# Patient Record
Sex: Male | Born: 1935 | Race: White | Hispanic: No | Marital: Married | Smoking: Never smoker
Health system: Southern US, Community
[De-identification: ages and names within clinical notes are randomized; demographics above are authoritative.]

## PROBLEM LIST (undated history)

## (undated) DIAGNOSIS — G309 Alzheimer's disease, unspecified: Secondary | ICD-10-CM

## (undated) DIAGNOSIS — I639 Cerebral infarction, unspecified: Secondary | ICD-10-CM

## (undated) DIAGNOSIS — C4490 Unspecified malignant neoplasm of skin, unspecified: Secondary | ICD-10-CM

## (undated) DIAGNOSIS — K5909 Other constipation: Secondary | ICD-10-CM

## (undated) DIAGNOSIS — F0280 Dementia in other diseases classified elsewhere without behavioral disturbance: Secondary | ICD-10-CM

## (undated) HISTORY — PX: HAND TENDON SURGERY: SHX663

## (undated) HISTORY — PX: OTHER SURGICAL HISTORY: SHX169

## (undated) HISTORY — PX: CATARACT EXTRACTION, BILATERAL: SHX1313

---

## 2000-08-15 ENCOUNTER — Encounter: Payer: Self-pay | Admitting: Specialist

## 2000-08-15 ENCOUNTER — Ambulatory Visit: Admission: RE | Admit: 2000-08-15 | Discharge: 2000-08-15 | Payer: Self-pay | Admitting: Specialist

## 2007-03-10 ENCOUNTER — Encounter: Admission: RE | Admit: 2007-03-10 | Discharge: 2007-03-10 | Payer: Self-pay | Admitting: Orthopedic Surgery

## 2007-03-12 ENCOUNTER — Ambulatory Visit (HOSPITAL_BASED_OUTPATIENT_CLINIC_OR_DEPARTMENT_OTHER): Admission: RE | Admit: 2007-03-12 | Discharge: 2007-03-12 | Payer: Self-pay | Admitting: Orthopedic Surgery

## 2007-03-12 ENCOUNTER — Encounter (INDEPENDENT_AMBULATORY_CARE_PROVIDER_SITE_OTHER): Payer: Self-pay | Admitting: Orthopedic Surgery

## 2009-01-05 ENCOUNTER — Encounter: Admission: RE | Admit: 2009-01-05 | Discharge: 2009-01-05 | Payer: Self-pay | Admitting: Emergency Medicine

## 2011-01-02 NOTE — Op Note (Signed)
NAME:  Matthew Hodge, Matthew Hodge NO.:  [id number]   MEDICAL RECORD NO.:  [id number]          PATIENT TYPE:  AMB   LOCATION:  DSC                          FACILITY:  [facility]   PHYSICIAN:  Cindee Salt, M.D.       DATE OF BIRTH:  06-09-1925   DATE OF PROCEDURE:  03/12/2007  DATE OF DISCHARGE:                               OPERATIVE REPORT   PREOPERATIVE DIAGNOSIS:  Mucoid cyst interphalangeal joint left thumb.   POSTOPERATIVE DIAGNOSIS:  Mucoid cyst interphalangeal joint left thumb.   OPERATION:  Excision mucoid cyst, debridement interphalangeal joint left  thumb with removal of loose body.   SURGEON:  Merlyn Lot, M.D.   ASSISTANT:  Carolyne Fiscal R.N.   ANESTHESIA:  Forearm-based IV regional.   HISTORY:  The patient is an 77 year old male with a history of a large  mass at the interphalangeal joint of his left thumb with obvious  degenerative changes.  X-rays revealed a large loose body.  He is  desirous of removal of the mass, debridement of the joint without a plan  for fusion.  He is aware of risks and complications including infection,  recurrence, injury to arteries, nerves, tendons, incomplete relief of  symptoms, and dystrophy.  He has elected to proceed to have this done.  Questions were encouraged and answered in the preoperative area.  The  patient is seen.  The extremity marked by both the patient and surgeon,  questions encouraged and answered again.   PROCEDURE:  The patient is brought to the operating room where a forearm-  based IV regional anesthetic was carried out without difficulty.  He was  prepped using DuraPrep, supine position, left arm free.  After 3-minute  dry time, he was draped.  Curvilinear incision was made over the mass,  interphalangeal joint radial aspect of the IP joint, carried down  through subcutaneous tissue.  Bleeders were electrocauterized.  The mass  was immediately encountered.  With blunt and sharp dissection, this was  dissected free.  The  joint opened.  The loose body was then excised.  A  debridement of the interphalangeal joint of osteophytes was then  performed with a small rongeur.  The specimens were sent to pathology.  The wound was then copiously irrigated with saline.  The capsule was  then closed with interrupted and figure-of-eight 4-0 Vicryl sutures to  attempt to stabilize the joint and prevent recurrence of the cyst.  The  skin was then closed with interrupted 5-0 Vicryl Rapide sutures.  Sterile compressive dressing and splint to the thumb applied.  The  patient tolerated the procedure well and was taken to the recovery room  for observation in satisfactory condition.  He is discharged home to  return to the [facility] [place] in 1 week on Vicodin.           ______________________________  Cindee Salt, M.D.     GK/MEDQ  D:  03/12/2007  T:  03/12/2007  Job:  [id number]

## 2011-06-04 LAB — POCT HEMOGLOBIN-HEMACUE
Hemoglobin: 11.9 — ABNORMAL LOW
Operator id: 154361

## 2012-07-02 ENCOUNTER — Other Ambulatory Visit: Payer: Self-pay | Admitting: Family Medicine

## 2012-07-02 DIAGNOSIS — N400 Enlarged prostate without lower urinary tract symptoms: Secondary | ICD-10-CM

## 2012-07-04 ENCOUNTER — Ambulatory Visit
Admission: RE | Admit: 2012-07-04 | Discharge: 2012-07-04 | Disposition: A | Discharge status: Home / Self Care | Payer: Medicare Other | Source: Ambulatory Visit | Attending: Family Medicine | Admitting: Family Medicine

## 2012-07-04 DIAGNOSIS — N400 Enlarged prostate without lower urinary tract symptoms: Secondary | ICD-10-CM

## 2012-07-04 MED ORDER — IOHEXOL 300 MG/ML  SOLN
100.0000 mL | Freq: Once | INTRAMUSCULAR | Status: AC | PRN
  Administered 2012-07-04: 100 mL via INTRAVENOUS

## 2012-07-15 ENCOUNTER — Observation Stay (HOSPITAL_COMMUNITY)
Admission: EM | Admit: 2012-07-15 | Discharge: 2012-07-16 | Disposition: A | Discharge status: Home / Self Care | Payer: Medicare Other | Attending: Internal Medicine | Admitting: Internal Medicine

## 2012-07-15 ENCOUNTER — Observation Stay (HOSPITAL_COMMUNITY): Payer: Medicare Other

## 2012-07-15 ENCOUNTER — Encounter (HOSPITAL_COMMUNITY): Payer: Self-pay | Admitting: *Deleted

## 2012-07-15 DIAGNOSIS — Q619 Cystic kidney disease, unspecified: Secondary | ICD-10-CM | POA: Insufficient documentation

## 2012-07-15 DIAGNOSIS — N179 Acute kidney failure, unspecified: Principal | ICD-10-CM | POA: Insufficient documentation

## 2012-07-15 DIAGNOSIS — Z79899 Other long term (current) drug therapy: Secondary | ICD-10-CM | POA: Insufficient documentation

## 2012-07-15 DIAGNOSIS — M25559 Pain in unspecified hip: Secondary | ICD-10-CM | POA: Insufficient documentation

## 2012-07-15 DIAGNOSIS — N19 Unspecified kidney failure: Secondary | ICD-10-CM

## 2012-07-15 DIAGNOSIS — D72829 Elevated white blood cell count, unspecified: Secondary | ICD-10-CM | POA: Diagnosis present

## 2012-07-15 DIAGNOSIS — R739 Hyperglycemia, unspecified: Secondary | ICD-10-CM | POA: Diagnosis present

## 2012-07-15 DIAGNOSIS — D649 Anemia, unspecified: Secondary | ICD-10-CM | POA: Insufficient documentation

## 2012-07-15 DIAGNOSIS — R338 Other retention of urine: Secondary | ICD-10-CM | POA: Insufficient documentation

## 2012-07-15 DIAGNOSIS — N138 Other obstructive and reflux uropathy: Secondary | ICD-10-CM | POA: Insufficient documentation

## 2012-07-15 DIAGNOSIS — N401 Enlarged prostate with lower urinary tract symptoms: Secondary | ICD-10-CM | POA: Diagnosis present

## 2012-07-15 DIAGNOSIS — R7309 Other abnormal glucose: Secondary | ICD-10-CM | POA: Insufficient documentation

## 2012-07-15 DIAGNOSIS — F0280 Dementia in other diseases classified elsewhere without behavioral disturbance: Secondary | ICD-10-CM | POA: Insufficient documentation

## 2012-07-15 DIAGNOSIS — R609 Edema, unspecified: Secondary | ICD-10-CM | POA: Insufficient documentation

## 2012-07-15 DIAGNOSIS — N390 Urinary tract infection, site not specified: Secondary | ICD-10-CM | POA: Diagnosis present

## 2012-07-15 DIAGNOSIS — K5909 Other constipation: Secondary | ICD-10-CM | POA: Insufficient documentation

## 2012-07-15 DIAGNOSIS — R5381 Other malaise: Secondary | ICD-10-CM | POA: Insufficient documentation

## 2012-07-15 DIAGNOSIS — M549 Dorsalgia, unspecified: Secondary | ICD-10-CM | POA: Insufficient documentation

## 2012-07-15 DIAGNOSIS — G309 Alzheimer's disease, unspecified: Secondary | ICD-10-CM | POA: Insufficient documentation

## 2012-07-15 HISTORY — DX: Alzheimer's disease, unspecified: G30.9

## 2012-07-15 HISTORY — DX: Unspecified malignant neoplasm of skin, unspecified: C44.90

## 2012-07-15 HISTORY — DX: Other constipation: K59.09

## 2012-07-15 HISTORY — DX: Dementia in other diseases classified elsewhere, unspecified severity, without behavioral disturbance, psychotic disturbance, mood disturbance, and anxiety: F02.80

## 2012-07-15 LAB — BASIC METABOLIC PANEL
GFR calc Af Amer: 21 mL/min — ABNORMAL LOW (ref >90)
GFR calc non Af Amer: 18 mL/min — ABNORMAL LOW (ref >90)
Glucose, Bld: 152 mg/dL — ABNORMAL HIGH (ref 70–99)
Potassium: 4.9 mEq/L (ref 3.5–5.1)
Sodium: 133 mEq/L — ABNORMAL LOW (ref 135–145)

## 2012-07-15 LAB — URINALYSIS, ROUTINE W REFLEX MICROSCOPIC
Bilirubin Urine: NEGATIVE
Glucose, UA: NEGATIVE mg/dL
Ketones, ur: NEGATIVE mg/dL
pH: 5.5 (ref 5.0–8.0)

## 2012-07-15 LAB — CBC WITH DIFFERENTIAL/PLATELET
Basophils Absolute: 0 10*3/uL (ref 0.0–0.1)
Basophils Relative: 0 % (ref 0–1)
Eosinophils Absolute: 0 10*3/uL (ref 0.0–0.7)
Lymphs Abs: 0.6 10*3/uL — ABNORMAL LOW (ref 0.7–4.0)
MCH: 32.6 pg (ref 26.0–34.0)
Neutrophils Relative %: 89 % — ABNORMAL HIGH (ref 43–77)
Platelets: 251 10*3/uL (ref 150–400)
RBC: 3.86 MIL/uL — ABNORMAL LOW (ref 4.22–5.81)
RDW: 12.8 % (ref 11.5–15.5)

## 2012-07-15 LAB — URINE MICROSCOPIC-ADD ON

## 2012-07-15 MED ORDER — ALUM & MAG HYDROXIDE-SIMETH 200-200-20 MG/5ML PO SUSP: 30.0000 mL | Freq: Four times a day (QID) | ORAL | Status: DC | PRN

## 2012-07-15 MED ORDER — SODIUM CHLORIDE 0.9 % IV SOLN
INTRAVENOUS | Status: DC
  Administered 2012-07-15: 15:00:00 via INTRAVENOUS
  Administered 2012-07-16: 100 mL/h via INTRAVENOUS

## 2012-07-15 MED ORDER — ONDANSETRON HCL 4 MG PO TABS: 4.0000 mg | ORAL_TABLET | Freq: Four times a day (QID) | ORAL | Status: DC | PRN

## 2012-07-15 MED ORDER — POLYETHYLENE GLYCOL 3350 17 G PO PACK
17.0000 g | PACK | Freq: Every day | ORAL | Status: DC | PRN
  Filled 2012-07-15: qty 1

## 2012-07-15 MED ORDER — LUBIPROSTONE 24 MCG PO CAPS
24.0000 ug | ORAL_CAPSULE | Freq: Two times a day (BID) | ORAL | Status: DC
  Administered 2012-07-15 – 2012-07-16 (×2): 24 ug via ORAL
  Filled 2012-07-15 (×5): qty 1

## 2012-07-15 MED ORDER — SODIUM CHLORIDE 0.9 % IV SOLN: INTRAVENOUS | Status: DC

## 2012-07-15 MED ORDER — CIPROFLOXACIN HCL 500 MG PO TABS
500.0000 mg | ORAL_TABLET | Freq: Two times a day (BID) | ORAL | Status: DC
  Administered 2012-07-15 – 2012-07-16 (×2): 500 mg via ORAL
  Filled 2012-07-15 (×3): qty 1

## 2012-07-15 MED ORDER — ONDANSETRON HCL 4 MG/2ML IJ SOLN: 4.0000 mg | Freq: Four times a day (QID) | INTRAMUSCULAR | Status: DC | PRN

## 2012-07-15 MED ORDER — ACETAMINOPHEN 500 MG PO TABS: 500.0000 mg | ORAL_TABLET | Freq: Four times a day (QID) | ORAL | Status: DC | PRN

## 2012-07-15 MED ORDER — SENNA 8.6 MG PO TABS: 2.0000 | ORAL_TABLET | Freq: Every evening | ORAL | Status: DC | PRN

## 2012-07-15 MED ORDER — POLYETHYLENE GLYCOL 3350 17 G PO PACK
17.0000 g | PACK | Freq: Every day | ORAL | Status: DC
  Administered 2012-07-15 – 2012-07-16 (×2): 17 g via ORAL
  Filled 2012-07-15 (×2): qty 1

## 2012-07-15 MED ORDER — TAMSULOSIN HCL 0.4 MG PO CAPS
0.4000 mg | ORAL_CAPSULE | Freq: Every day | ORAL | Status: DC
  Administered 2012-07-15: 0.4 mg via ORAL
  Filled 2012-07-15 (×2): qty 1

## 2012-07-15 NOTE — Consult Note (Signed)
Urology Consult   Physician requesting consult: Rama  Reason for consult: Urinary retention  History of Present Illness: Matthew Hodge is a 78 y.o. male with PMH significant for alzheimer's disease, skin cancer, and chronic constipation who presented to the ED today with c/o several days of severe lower abdominal and back discomfort.  He has had urinary frequency and urgency but no fevers.  A CT pelvis with contrast was done on 07/04/12 that revealed an enlarged prostate and several colonic diverticula. The pt was evaled by his PCP on 07/14/12 and was given Cipro.  He is on Flomax but he does not know who started the medication or when.  While in the ED a foley was placed with over 1L of urine return.  The pts sx improved immediately.  UA is clear with the exception of microscopic hematuria which is likely from foley insertion.  His Cr is elevated at 2.98.  The pt has alzheimer's and is not able to provide any history.  He states "you should talk to my wife because I can't remember anything."  HPI was obtained from the RN and previous documentation.   He is currently comfortable and without complaint.  Past Medical History  Diagnosis Date  . Alzheimer's disease   . Skin cancer   . Chronic constipation     Past Surgical History  Procedure Date  . Hand tendon surgery   . Cataract extraction, bilateral   . Skin cancer removal forehead     Current Hospital Medications:  Home Meds:  Prior to Admission medications   Medication Sig Start Date End Date Taking? Authorizing Provider  acetaminophen (TYLENOL) 500 MG tablet Take 500 mg by mouth every 6 (six) hours as needed. For pain.   Yes Historical Provider, MD  ciprofloxacin (CIPRO) 500 MG tablet Take 500 mg by mouth 2 (two) times daily. 14 day course of therapy; started 07/14/2012   Yes Historical Provider, MD  lubiprostone (AMITIZA) 24 MCG capsule Take 24 mcg by mouth 2 (two) times daily with a meal.   Yes Historical Provider, MD    Tamsulosin HCl (FLOMAX) 0.4 MG CAPS Take 0.4 mg by mouth at bedtime.   Yes Historical Provider, MD     Scheduled Meds:   . ciprofloxacin  500 mg Oral BID  . lubiprostone  24 mcg Oral BID WC  . Tamsulosin HCl  0.4 mg Oral QHS  . [DISCONTINUED] sodium chloride   Intravenous STAT   Continuous Infusions:   . sodium chloride     PRN Meds:.acetaminophen, alum & mag hydroxide-simeth, ondansetron (ZOFRAN) IV, ondansetron, polyethylene glycol  Allergies: No Known Allergies  Family History  Problem Relation Age of Onset  . Stroke Mother   . Stroke Father   . COPD Brother   . Heart disease Brother   . Diabetes Brother     Social History:  reports that he has never smoked. He has never used smokeless tobacco. He reports that he does not drink alcohol or use illicit drugs.  ROS: A complete review of systems could not be completed due to the pt's alzheimers disease  Physical Exam:  Vital signs in last 24 hours: Temp:  [97.4 F (36.3 C)-98.6 F (37 C)] 97.7 F (36.5 C) (11/26 1300) Pulse Rate:  [72-81] 72  (11/26 1300) Resp:  [18-20] 18  (11/26 1300) BP: (147-165)/(59-90) 154/59 mmHg (11/26 1300) SpO2:  [97 %-99 %] 97 % (11/26 1300) Weight:  [80.5 kg (177 lb 7.5 oz)-83.915 kg (185 lb)] 80.5 kg (  177 lb 7.5 oz) (11/26 1300) General:  Alert and oriented, No acute distress HEENT: Normocephalic, atraumatic Neck: No JVD or lymphadenopathy Cardiovascular: Regular rate and rhythm Lungs: Clear bilaterally Abdomen: Soft, nondistended, no abdominal masses; mildly tender over suprapubic area Back: No CVA tenderness Extremities: No edema Neurologic: Grossly intact GU: foley in place with approx 1000cc clear/yellow urine in bag  Laboratory Data:   [facility] 07/15/12 0910  WBC 12.9*  HGB 12.6*  HCT 35.6*  PLT 251     Basename 07/15/12 0910  NA 133*  K 4.9  CL 98  GLUCOSE 152*  BUN 40*  CALCIUM 8.7  CREATININE 2.98*     Results for orders placed during the hospital  encounter of 07/15/12 (from the past 24 hour(s))  CBC WITH DIFFERENTIAL     Status: Abnormal   Collection Time   07/15/12  9:10 AM      Component Value Range   WBC 12.9 (*) 4.0 - 10.5 K/uL   RBC 3.86 (*) 4.22 - 5.81 MIL/uL   Hemoglobin 12.6 (*) 13.0 - 17.0 g/dL   HCT 47.8 (*) 29.5 - 62.1 %   MCV 92.2  78.0 - 100.0 fL   MCH 32.6  26.0 - 34.0 pg   MCHC 35.4  30.0 - 36.0 g/dL   RDW 30.8  65.7 - 84.6 %   Platelets 251  150 - 400 K/uL   Neutrophils Relative 89 (*) 43 - 77 %   Neutro Abs 11.5 (*) 1.7 - 7.7 K/uL   Lymphocytes Relative 5 (*) 12 - 46 %   Lymphs Abs 0.6 (*) 0.7 - 4.0 K/uL   Monocytes Relative 6  3 - 12 %   Monocytes Absolute 0.8  0.1 - 1.0 K/uL   Eosinophils Relative 0  0 - 5 %   Eosinophils Absolute 0.0  0.0 - 0.7 K/uL   Basophils Relative 0  0 - 1 %   Basophils Absolute 0.0  0.0 - 0.1 K/uL  BASIC METABOLIC PANEL     Status: Abnormal   Collection Time   07/15/12  9:10 AM      Component Value Range   Sodium 133 (*) 135 - 145 mEq/L   Potassium 4.9  3.5 - 5.1 mEq/L   Chloride 98  96 - 112 mEq/L   CO2 23  19 - 32 mEq/L   Glucose, Bld 152 (*) 70 - 99 mg/dL   BUN 40 (*) 6 - 23 mg/dL   Creatinine, Ser 9.62 (*) 0.50 - 1.35 mg/dL   Calcium 8.7  8.4 - 95.2 mg/dL   GFR calc non Af Amer 18 (*) >90 mL/min   GFR calc Af Amer 21 (*) >90 mL/min  URINALYSIS, ROUTINE W REFLEX MICROSCOPIC     Status: Abnormal   Collection Time   07/15/12 10:56 AM      Component Value Range   Color, Urine YELLOW  YELLOW   APPearance CLEAR  CLEAR   Specific Gravity, Urine 1.011  1.005 - 1.030   pH 5.5  5.0 - 8.0   Glucose, UA NEGATIVE  NEGATIVE mg/dL   Hgb urine dipstick MODERATE (*) NEGATIVE   Bilirubin Urine NEGATIVE  NEGATIVE   Ketones, ur NEGATIVE  NEGATIVE mg/dL   Protein, ur NEGATIVE  NEGATIVE mg/dL   Urobilinogen, UA 0.2  0.0 - 1.0 mg/dL   Nitrite NEGATIVE  NEGATIVE   Leukocytes, UA NEGATIVE  NEGATIVE  URINE MICROSCOPIC-ADD ON     Status: Abnormal   Collection Time  07/15/12 10:56  AM      Component Value Range   Squamous Epithelial / LPF RARE  RARE   WBC, UA 0-2  <3 WBC/hpf   RBC / HPF 3-6  <3 RBC/hpf   Bacteria, UA FEW (*) RARE   Urine-Other MUCOUS PRESENT     No results found for this or any previous visit (from the past 240 hour(s)).  Renal Function:  Basename 07/15/12 0910  CREATININE 2.98*   Estimated Creatinine Clearance: 19.3 ml/min (by C-G formula based on Cr of 2.98).  Radiologic Imaging: Clinical Data: Enlarged prostate on physical exam  CT PELVIS WITH CONTRAST  Technique: Multidetector CT imaging of the pelvis was performed  using the standard protocol following the bolus administration of  intravenous contrast.  Contrast: OMNIPAQUE IOHEXOL 300 MG/ML SOLN  Comparison: None.  Findings: The prostate is enlarged measuring 4.3 x 5.5 x 5.5 cm  with inhomogeneous enhancement. The enlarged lobular prostate does  indent the posterior inferior aspect of the urinary bladder. The  urinary bladder is not thick walled and is unremarkable. Some  fluid is noted within the inguinal canals right greater than left.  There are multiple rectosigmoid colonic diverticula present.  Moderate atheromatous change is noted of the distal abdominal aorta  and common iliac arteries. Degenerative changes are noted  throughout the facet joints of the lower lumbar spine  IMPRESSION:  .  1. Enlarged prostate is noted above which does somewhat indent the  post inferior aspect of the urinary bladder.  2. Some fluid is noted in the inguinal canals right greater than  left.  3. Multiple rectosigmoid colonic diverticula.  Original Report Authenticated By: Dwyane Dee, M.D.  Impression/Assessment/Plan:  Urinary retention/ARF/CKD--this is likely associated with BPH. However, it is unclear how long he has been having retention issues.  He has chronic constipation which is also a contributing factor to retention and should be treated aggressively.  Foley should be left in  place for a least 1 week with continuation of Flomax.  A void trial can be performed in our office as an outpatient.  He will likely require cysto/PUS for size,  +/- urodynamics. If he fails the void trial and continues to have retention issues, decisions will have to be made regarding long term treatment i.e TURP vs I/O cathing vs SP tube or a combination of treatments.  I have evaluated the  pt and spoken  with his daughter to obtain additional hx and arrange f/u.   1. Daughter needs to arrange Power of Health Care Attorney 2. Keep foley catheter in place, and start of Flomax 3. Will give voiding trial in office as outpatient 4. In office prostate ultrasound, cysto, and flow rate, pvr.  5. In office discussion re: TURP, +/- S-P tube, or chronic foley cath in elderly man with advancing alzheimer's disease.  6. Medicine to aggressively treat constipation. Will be important part of potential bladder recovery.   Acute renal failure--due to obstruction and retention.  Follow Cr. Relieve obstipation  per IM.   [facility]rology for follow-up    YARBROUGH,AMANDA G. 07/15/2012, 2:26 PM

## 2012-07-15 NOTE — ED Notes (Signed)
Gave pt something to drink and eat 

## 2012-07-15 NOTE — ED Notes (Addendum)
Attempted to call report. Nurse will call back.

## 2012-07-15 NOTE — H&P (Signed)
[facility] History and Physical  EDAN JUDAY JWJ:[id number] DOB: 16-Dec-1924 DOA: 07/15/2012  Referring physician: Dr. Lorre Nick PCP: Leo Grosser, MD   Chief Complaint: Abdominal pain.   History of Present Illness: Matthew Hodge is an 78 y.o. male with a PMH of Alzheimer's disease who was brought to the hospital by his family secondary to groin, abdominal, back and hip pain for several days.  He has had progressive weakness since that time.  He has had urinary frequency and urgency, but no fever or dysuria.  He was seen by his PCP 07/14/12 and prescribed Cipro.  He has been restless.  Upon initial evaluation in the ER, the patient had a urinary catheter placed and it drained over 1 liter of urine.  His symptoms have dramatically improved since the catheter was placed.  Review of Systems: Constitutional: No fever, no chills;  Appetite normal; + weight loss over a long period of time, no weight gain.  HEENT: No blurry vision, no diplopia, no pharyngitis, no dysphagia, wears hearing aides CV: No chest pain, no palpitations.  Resp: No SOB, no cough. GI: No nausea, no vomiting, no diarrhea, no melena, no hematochezia.  GU: No dysuria, no hematuria, + urinary frequency/hesitancy/retention.  MSK: no myalgias, no arthralgias.  Neuro:  No headache, no focal neurological deficits, no history of seizures.  Psych: No depression, no anxiety.  Endo: No thyroid disease, no DM, no heat intolerance, no cold intolerance, no polyuria, no polydipsia  Skin: No rashes, no skin lesions.  Heme: No easy bruising, no history of blood diseases.  Past Medical History Past Medical History  Diagnosis Date  . Alzheimer's disease   . Skin cancer   . Chronic constipation      Past Surgical History Past Surgical History  Procedure Date  . Hand tendon surgery   . Cataract extraction, bilateral   . Skin cancer removal forehead      Social History: History   Social History  . Marital  Status: Married    Spouse Name: N/A    Number of Children: 2  . Years of Education: N/A   Occupational History  . Retired Automotive engineer    Social History Main Topics  . Smoking status: Never Smoker   . Smokeless tobacco: Never Used  . Alcohol Use: No  . Drug Use: No  . Sexually Active: No   Other Topics Concern  . Not on file   Social History Narrative   Married.  Lives in [place] with his wife.  Ambulates with a cane.      Family History:  Family History  Problem Relation Age of Onset  . Stroke Mother   . Stroke Father   . COPD Brother   . Heart disease Brother   . Diabetes Brother     Allergies: Review of patient's allergies indicates no known allergies.  Meds: Prior to Admission medications   Medication Sig Start Date End Date Taking? Authorizing Provider  acetaminophen (TYLENOL) 500 MG tablet Take 500 mg by mouth every 6 (six) hours as needed. For pain.   Yes Historical Provider, MD  ciprofloxacin (CIPRO) 500 MG tablet Take 500 mg by mouth 2 (two) times daily. 14 day course of therapy; started 07/14/2012   Yes Historical Provider, MD  lubiprostone (AMITIZA) 24 MCG capsule Take 24 mcg by mouth 2 (two) times daily with a meal.   Yes Historical Provider, MD  Tamsulosin HCl (FLOMAX) 0.4 MG CAPS Take 0.4 mg by mouth  at bedtime.   Yes Historical Provider, MD    Physical Exam: Filed Vitals:   07/15/12 0824 07/15/12 1204 07/15/12 1300  BP: 165/90 147/60 154/59  Pulse: 81 81 72  Temp: 97.4 F (36.3 C) 98.6 F (37 C) 97.7 F (36.5 C)  TempSrc: Oral Oral   Resp: 18 20 18   Height:   5\' 11"  (1.803 m)  Weight: 83.915 kg (185 lb)  80.5 kg (177 lb 7.5 oz)  SpO2: 98% 99% 97%     Physical Exam: Blood pressure 154/59, pulse 72, temperature 97.7 F (36.5 C), temperature source Oral, resp. rate 18, height 5\' 11"  (1.803 m), weight 80.5 kg (177 lb 7.5 oz), SpO2 97.00%. Gen: No acute distress Head: Normocephalic, atraumatic. He does have a wound on  his forehead from a prior skin cancer resection. Eyes: PERRLA, EOMI. Lens implants noted bilaterally. Mouth: Oropharynx clear with upper and lower dentures in place. Neck: Supple, no thyromegaly, no adenopathy, no jugular venous distention. Chest: Lungs clear to auscultation bilaterally with good air movement. CV: Regular rate, and rhythm. No murmurs, rubs, or gallops. Abdomen: Soft, nontender, nondistended with normal active bowel sounds. Extremities: Trace edema bilaterally. Skin: Warm and dry. Neuro: Alert and oriented x2. Cranial nerves II through XII grossly intact. Generalized weakness but no focal deficits. Psych: Mood and affect normal.  Labs on Admission:  Basic Metabolic Panel:  Lab 07/15/12 4098  NA 133*  K 4.9  CL 98  CO2 23  GLUCOSE 152*  BUN 40*  CREATININE 2.98*  CALCIUM 8.7  MG --  PHOS --   CBC:  Lab 07/15/12 0910  WBC 12.9*  NEUTROABS 11.5*  HGB 12.6*  HCT 35.6*  MCV 92.2  PLT 251   Urinalysis    Component Value Date/Time   COLORURINE YELLOW 07/15/2012 1056   APPEARANCEUR CLEAR 07/15/2012 1056   LABSPEC 1.011 07/15/2012 1056   PHURINE 5.5 07/15/2012 1056   GLUCOSEU NEGATIVE 07/15/2012 1056   HGBUR MODERATE* 07/15/2012 1056   BILIRUBINUR NEGATIVE 07/15/2012 1056   KETONESUR NEGATIVE 07/15/2012 1056   PROTEINUR NEGATIVE 07/15/2012 1056   UROBILINOGEN 0.2 07/15/2012 1056   NITRITE NEGATIVE 07/15/2012 1056   LEUKOCYTESUR NEGATIVE 07/15/2012 1056     Radiological Exams on Admission: No results found.   Assessment/Plan Principal Problem:  *Enlarged prostate with urinary retention  The patient was admitted and a Foley catheter placed.  Continue Flomax.  Urology consultation requested.  Will get a renal ultrasound.  Continue antibiotics for UTI (U/A relatively clear, but has taken several doses of Cipro) Active Problems:  Chronic constipation  Continue Amitiza.  MiraLAX PRN.  Hyperglycemia  Not a fasting sample.  BMET in a.m.   Acute renal failure  Likely post-obstructive.  Will get a renal ultrasound.  Hydrate and follow creatinine.  Leukocytosis  Likely from UTI, treating empirically.  Normocytic anemia  Mild, monitor.  UTI (lower urinary tract infection)  Continue empiric Cipro.  U/A negative for nitrates and leukocytes, but had been on antibiotics for 24 hours prior to the sample being sent.  Follow up urine culture.  Code Status: Full Family Communication: Gilead Oneal [phone]. Disposition Plan: Home when stable.  Time spent: 1 hour.  RAMA,CHRISTINA [facility] Pager [phone]  If 7PM-7AM, please contact night-coverage [email or web address] Password [email or web address] 07/15/2012, 2:10 PM

## 2012-07-15 NOTE — Plan of Care (Signed)
Problem: Phase I Progression Outcomes Goal: Voiding-avoid urinary catheter unless indicated Outcome: Not Progressing Foley catheter inserted for urinary retention

## 2012-07-15 NOTE — ED Provider Notes (Signed)
History     CSN: [id number]  Arrival date & time 07/15/12  4098   First MD Initiated Contact with Patient 07/15/12 [phone]      Chief Complaint  Patient presents with  . Urinary Retention  . Flank Pain    bilateral    (Consider location/radiation/quality/duration/timing/severity/associated sxs/prior treatment) Patient is a 78 y.o. male presenting with flank pain. The history is provided by the spouse and a relative.  Flank Pain   patient here with urinary retention x2 days. Saw his physician and was prescribed Cipro yesterday. Continues with urinary frequency of small amounts. No fever or vomiting. Some flank pain. No prior history of prostate enlargement. Urination makes her symptoms better.  Past Medical History  Diagnosis Date  . Alzheimer's disease     Past Surgical History  Procedure Date  . Hand tendon surgery     History reviewed. No pertinent family history.  History  Substance Use Topics  . Smoking status: Never Smoker   . Smokeless tobacco: Never Used  . Alcohol Use: No      Review of Systems  Genitourinary: Positive for flank pain.  All other systems reviewed and are negative.    Allergies  Review of patient's allergies indicates no known allergies.  Home Medications  No current outpatient prescriptions on file.  BP 165/90  Pulse 81  Temp 97.4 F (36.3 C) (Oral)  Resp 18  Wt 185 lb (83.915 kg)  SpO2 98%  Physical Exam  Nursing note and vitals reviewed. Constitutional: He is oriented to person, place, and time. He appears well-developed and well-nourished.  Non-toxic appearance. No distress.  HENT:  Head: Normocephalic and atraumatic.  Eyes: Conjunctivae normal, EOM and lids are normal. Pupils are equal, round, and reactive to light.  Neck: Normal range of motion. Neck supple. No tracheal deviation present. No mass present.  Cardiovascular: Normal rate, regular rhythm and normal heart sounds.  Exam reveals no gallop.   No murmur  heard. Pulmonary/Chest: Effort normal and breath sounds normal. No stridor. No respiratory distress. He has no decreased breath sounds. He has no wheezes. He has no rhonchi. He has no rales.  Abdominal: Soft. Normal appearance and bowel sounds are normal. He exhibits no distension. There is tenderness in the suprapubic area. There is no rigidity, no rebound, no guarding and no CVA tenderness.  Musculoskeletal: Normal range of motion. He exhibits no edema and no tenderness.  Neurological: He is alert and oriented to person, place, and time. He has normal strength. No cranial nerve deficit or sensory deficit. GCS eye subscore is 4. GCS verbal subscore is 5. GCS motor subscore is 6.  Skin: Skin is warm and dry. No abrasion and no rash noted.  Psychiatric: His speech is normal. His affect is blunt. He is slowed.    ED Course  Procedures (including critical care time)   Labs Reviewed  URINALYSIS, ROUTINE W REFLEX MICROSCOPIC  CBC WITH DIFFERENTIAL  BASIC METABOLIC PANEL  URINE CULTURE   No results found.   No diagnosis found.    MDM  The patient had a Foley catheter placed by nursing and drained over a liter of urine. He has evidence of renal failure likely from post obstructive causes. He'll be admitted to the hospital.        Toy Baker, MD 07/15/12 1113

## 2012-07-15 NOTE — ED Notes (Signed)
Pt from home with reports of urinary retention, "just dribbling" since yesterday as well as bilateral flank pain, bladder and abdominal pain. Pt denies fever, N/V/D. Pt's son also endorses that pt was treated by PCP yesterday with same symptoms and was given Cipro with no relief.

## 2012-07-15 NOTE — Plan of Care (Signed)
Problem: Problem: Bowel/Bladder Progression Goal: FOLEY CATHETER NOT INDICATED Outcome: Not Met (add Reason) Foley catheter inserted 07/15/12 for urinary retention.

## 2012-07-16 DIAGNOSIS — R339 Retention of urine, unspecified: Secondary | ICD-10-CM

## 2012-07-16 DIAGNOSIS — N179 Acute kidney failure, unspecified: Principal | ICD-10-CM

## 2012-07-16 DIAGNOSIS — N401 Enlarged prostate with lower urinary tract symptoms: Secondary | ICD-10-CM

## 2012-07-16 DIAGNOSIS — K5900 Constipation, unspecified: Secondary | ICD-10-CM

## 2012-07-16 LAB — URINE CULTURE

## 2012-07-16 LAB — BASIC METABOLIC PANEL
BUN: 26 mg/dL — ABNORMAL HIGH (ref 6–23)
CO2: 23 mEq/L (ref 19–32)
Calcium: 8.3 mg/dL — ABNORMAL LOW (ref 8.4–10.5)
Chloride: 104 mEq/L (ref 96–112)
Creatinine, Ser: 1.22 mg/dL (ref 0.50–1.35)
GFR calc Af Amer: 61 mL/min — ABNORMAL LOW (ref >90)
GFR calc non Af Amer: 52 mL/min — ABNORMAL LOW (ref >90)
Glucose, Bld: 105 mg/dL — ABNORMAL HIGH (ref 70–99)
Potassium: 3.9 mEq/L (ref 3.5–5.1)
Sodium: 136 mEq/L (ref 135–145)

## 2012-07-16 LAB — GLUCOSE, CAPILLARY: Glucose-Capillary: 125 mg/dL — ABNORMAL HIGH (ref 70–99)

## 2012-07-16 MED ORDER — CIPROFLOXACIN HCL 500 MG PO TABS: 500.0000 mg | ORAL_TABLET | Freq: Two times a day (BID) | ORAL | Status: DC

## 2012-07-16 MED ORDER — SENNA 8.6 MG PO TABS: 2.0000 | ORAL_TABLET | Freq: Every evening | ORAL | Status: DC | PRN

## 2012-07-16 MED ORDER — POLYETHYLENE GLYCOL 3350 17 G PO PACK: 17.0000 g | PACK | Freq: Every day | ORAL | Status: DC

## 2012-07-16 NOTE — Progress Notes (Signed)
CARE MANAGEMENT NOTE 07/16/2012  Patient:  Matthew Hodge, Matthew Hodge   Account Number:  [id number]  Date Initiated:  07/16/2012  Documentation initiated by:  Erian Lariviere  Subjective/Objective Assessment:   pt with urinary tract obstruction from bph, required uro inertion of foley cath, urine outpt improved.     Action/Plan:   from home has hx of alzheimer dz.   Anticipated DC Date:  07/16/2012   Anticipated DC Plan:  HOME W HOME HEALTH SERVICES  In-house referral  NA      DC Planning Services  CM consult      [facility] Choice  HOME HEALTH   Choice offered to / List presented to:  C-4 Adult Children   DME arranged  NA      DME agency  NA     HH arranged  HH-1 RN      [facility] agency  [facility]   Status of service:  Completed, signed off Medicare Important Message given?  NA - LOS <3 / Initial given by admissions (If response is "NO", the following Medicare IM given date fields will be blank) Date Medicare IM given:   Date Additional Medicare IM given:    Discharge Disposition:  HOME W HOME HEALTH SERVICES  Per UR Regulation:  Reviewed for med. necessity/level of care/duration of stay  If discussed at Long Length of Stay Meetings, dates discussed:    Comments:  [id number]/Vernis Cabacungan Earlene Plater, RN, BSN, CCM: CHART REVIEWED AND UPDATED.  Next chart review due on [id number]. NO DISCHARGE NEEDS PRESENT AT THIS TIME. CASE MANAGEMENT [phone]

## 2012-07-16 NOTE — Progress Notes (Signed)
Patient discharged home, all discharge medications and instructions reviewed and questions answered. [facility] with [facility]. Demonstrated and had family do a return demonstration of switching from foley bag to leg bag and how to empty each bag. Patient to be assisted to vehicle by wheelchair.

## 2012-07-16 NOTE — Discharge Summary (Signed)
Physician Discharge Summary  Matthew Hodge:[id number] DOB: 23-Jun-1925 DOA: 07/15/2012  PCP: Leo Grosser, MD  Admit date: 07/15/2012 Discharge date: 07/16/2012  Time spent: 35  minutes  Recommendations for Outpatient Follow-up:  1. Follow up with Urology, Dr Patsi Sears within 1 week. Follow up with PCP for Bmet.   Discharge Diagnoses:  Acute renal failure, post obstructive secondary to urine retention. Enlarged prostate with urinary retention  Chronic constipation  Hyperglycemia  Acute renal failure  Leukocytosis  Normocytic anemia  UTI (lower urinary tract infection)   Discharge Condition: Stable.  Diet recommendation: Health Healthy.  Filed Weights   07/15/12 0824 07/15/12 1300  Weight: 83.915 kg (185 lb) 80.5 kg (177 lb 7.5 oz)    History of present illness:  Matthew Hodge is an 78 y.o. male with a PMH of Alzheimer's disease who was brought to the hospital by his family secondary to groin, abdominal, back and hip pain for several days. He has had progressive weakness since that time. He has had urinary frequency and urgency, but no fever or dysuria. He was seen by his PCP 07/14/12 and prescribed Cipro. He has been restless. Upon initial evaluation in the ER, the patient had a urinary catheter placed and it drained over 1 liter of urine. His symptoms have dramatically improved since the catheter was placed.   Hospital Course:   Enlarged prostate with urinary retention  The patient was admitted and a Foley catheter placed. Continue Flomax.  Urology consultation requested, Dr Meredeth Ide recommend to continue with foley catheter for 1 week, Flomax, voiding trial in 1 week. Patient might need In office prostate ultrasound, cysto, and flow rate, pvr.  renal ultrasound  Mild fullness of the left renal collecting system without overt  caliectasis.  Minimally complex left renal cyst. Continue antibiotics for UTI (U/A relatively clear, but has taken several doses  of Cipro). Complete total of 7 days.   Chronic constipation  Continue Amitiza. MiraLAX added to regimen. Hyperglycemia  Not a fasting sample. BMET in a.m. Acute renal failure  Likely post-obstructive. renal ultrasound negative for hydronephrosis. Cr. peak to 2.9, has decrease to 1.2. Needs repeat B-met and follow up with PCP. Leukocytosis  Likely from UTI, treating empirically. Normocytic anemia  Mild, monitor. UTI (lower urinary tract infection)  Continue empiric Cipro. U/A negative for nitrates and leukocytes, but had been on antibiotics for 24 hours prior to the sample being sent. Needs to Follow up urine culture by PCP.      Consultations: Urology, Dr. Patsi Sears.  Discharge Exam: Filed Vitals:   07/15/12 1204 07/15/12 1300 07/15/12 2212 07/16/12 0628  BP: 147/60 154/59 134/66 140/60  Pulse: 81 72 79 80  Temp: 98.6 F (37 C) 97.7 F (36.5 C) 99.1 F (37.3 C) 98.2 F (36.8 C)  TempSrc: Oral  Oral Oral  Resp: 20 18 18 16   Height:  5\' 11"  (1.803 m)    Weight:  80.5 kg (177 lb 7.5 oz)    SpO2: 99% 97% 95% 98%    General: No Distress. Cardiovascular: S 1,S2 RRR Respiratory: CTA Abdomen: Soft, NT, ND No rigidity.  Discharge Instructions  Discharge Orders    Future Orders Please Complete By Expires   Diet - low sodium heart healthy      Increase activity slowly          Medication List     As of 07/16/2012 11:02 AM    TAKE these medications         acetaminophen 500  MG tablet   Commonly known as: TYLENOL   Take 500 mg by mouth every 6 (six) hours as needed. For pain.      ciprofloxacin 500 MG tablet   Commonly known as: CIPRO   Take 1 tablet (500 mg total) by mouth 2 (two) times daily.      lubiprostone 24 MCG capsule   Commonly known as: AMITIZA   Take 24 mcg by mouth 2 (two) times daily with a meal.      polyethylene glycol packet   Commonly known as: MIRALAX / GLYCOLAX   Take 17 g by mouth daily.      senna 8.6 MG Tabs   Commonly known as:  SENOKOT   Take 2 tablets (17.2 mg total) by mouth at bedtime as needed.      Tamsulosin HCl 0.4 MG Caps   Commonly known as: FLOMAX   Take 0.4 mg by mouth at bedtime.          The results of significant diagnostics from this hospitalization (including imaging, microbiology, ancillary and laboratory) are listed below for reference.    Significant Diagnostic Studies: Ct Pelvis W Contrast  07/04/2012  *RADIOLOGY REPORT*  Clinical Data:  Enlarged prostate on physical exam  CT PELVIS WITH CONTRAST  Technique:  Multidetector CT imaging of the pelvis was performed using the standard protocol following the bolus administration of intravenous contrast.  Contrast: OMNIPAQUE IOHEXOL 300 MG/ML  SOLN  Comparison:   None.  Findings:  The prostate is enlarged measuring 4.3 x 5.5 x 5.5 cm with inhomogeneous enhancement.  The enlarged lobular prostate does indent the posterior inferior aspect of the urinary bladder.  The urinary bladder is not thick walled and is unremarkable.  Some fluid is noted within the inguinal canals right greater than left. There are multiple rectosigmoid colonic diverticula present. Moderate atheromatous change is noted of the distal abdominal aorta and common iliac arteries.  Degenerative changes are noted throughout the facet joints of the lower lumbar spine  IMPRESSION: . 1.  Enlarged prostate is noted above which does somewhat indent the post inferior aspect of the urinary bladder. 2.  Some fluid is noted in the inguinal canals right greater than left. 3.  Multiple rectosigmoid colonic diverticula.   Original Report Authenticated By: Dwyane Dee, M.D.    US Renal  07/15/2012  *RADIOLOGY REPORT*  Clinical Data:  Acute renal failure, evaluate prostatomegaly and hydronephrosis  RENAL/URINARY TRACT ULTRASOUND COMPLETE  Comparison:  CT pelvis 07/04/2012  Findings:  Right Kidney:  The right kidney is normal in size, contour and parenchymal echogenicity measuring 9.8 cm in length.  No  focal lesion.  No hydronephrosis.  Left Kidney:  Normal in size (10.4 cm in length) and parenchymal echogenicity.  No evidence of solid mass. Limited evaluation. There is mild fullness of the renal collecting system without overt hydronephrosis.  A 2.3 x 2.3 x 1.8 cm hypoechoic cystic lesion with a very thin internal septation is most consistent with a minimally complex renal cyst.  Bladder:  Foley catheter in the collapsed bladder.  IMPRESSION:  1. Mild fullness of the left renal collecting system without overt caliectasis.  2.  Minimally complex left renal cyst.   Original Report Authenticated By: Malachy Moan, M.D.     Microbiology: No results found for this or any previous visit (from the past 240 hour(s)).   Labs: Basic Metabolic Panel:  Lab 07/16/12 1308 07/15/12 0910  NA 136 133*  K 3.9 4.9  CL 104  98  CO2 23 23  GLUCOSE 105* 152*  BUN 26* 40*  CREATININE 1.22 2.98*  CALCIUM 8.3* 8.7  MG -- --  PHOS -- --   CBC:  Lab 07/15/12 0910  WBC 12.9*  NEUTROABS 11.5*  HGB 12.6*  HCT 35.6*  MCV 92.2  PLT 251   Cardiac Enzymes: No results found for this basename: CKTOTAL:5,CKMB:5,CKMBINDEX:5,TROPONINI:5 in the last 168 hours BNP: BNP (last 3 results) No results found for this basename: PROBNP:3 in the last 8760 hours CBG:  Lab 07/15/12 2203  GLUCAP 125*       Signed:  Tiersa Dayley  [facility] 07/16/2012, 11:02 AM

## 2012-07-16 NOTE — Evaluation (Signed)
Physical Therapy Evaluation Patient Details Name: ABDULRAHEEM PINEO MRN: [id number] DOB: July 22, 1925 Today's Date: 07/16/2012 Time: 7829-5621 PT Time Calculation (min): 42 min  PT Assessment / Plan / Recommendation Clinical Impression  78 yo male admitted with urinary retention, abdominal and back pain.  He had been functionally independent at home as recently as a few days ago.  Pt presents with deconditioning, back and leg pain that increase with activity.  Feel he would benefir from HHPT for  strengthening, balance and fall prevention programs. Also recommend HHaide to decrease burden of care for elderly wife  as pt recovers from this episode    PT Assessment  Patient needs continued PT services    Follow Up Recommendations  Home health PT [facility])    Does the patient have the potential to tolerate intense rehabilitation      Barriers to Discharge        Equipment Recommendations       Recommendations for Other Services     Frequency Min 3X/week    Precautions / Restrictions Precautions Precautions: Fall Precaution Comments: pt reports he has had falls backwards recently Restrictions Weight Bearing Restrictions: No   Pertinent Vitals/Pain C/o back pain and leg pain with increase in activity      Mobility  Bed Mobility Bed Mobility: Supine to Sit;Sit to Supine Supine to Sit: 4: Min assist Details for Bed Mobility Assistance: extra time Transfers Transfers: Sit to Stand;Stand to Sit Sit to Stand: 5: Supervision Stand to Sit: 5: Supervision Details for Transfer Assistance: encouragement to stand erect and acitivate core Ambulation/Gait Ambulation/Gait Assistance: 4: Min assist Ambulation Distance (Feet): 75 Feet Assistive device: Rolling walker Ambulation/Gait Assistance Details: pt needs more assist as he fatigues and pain in back and legs increases Gait Pattern: Step-through pattern;Antalgic Gait velocity: decreased General Gait Details: needs RW for support  due to limited endurance and pain Stairs: No    Shoulder Instructions     Exercises Other Exercises Other Exercises: pt instructed in beginning core and LE exercise, given written sheet and copy placed in shadow chart Other Exercises: repeated sit to stand x 5 reps with isometric abdominal, glutes and trunk extension in standing.  Other Exercises: Bilateral hip to hip for core activation in sitting and standing   PT Diagnosis:    PT Problem List: Decreased activity tolerance;Pain PT Treatment Interventions: DME instruction;Gait training;Stair training;Therapeutic activities;Therapeutic exercise;Patient/family education   PT Goals Acute Rehab PT Goals PT Goal Formulation: With patient/family Time For Goal Achievement: 08/27/12 Potential to Achieve Goals: Good Pt will go Supine/Side to Sit: Independently PT Goal: Supine/Side to Sit - Progress: Goal set today Pt will go Sit to Supine/Side: Independently PT Goal: Sit to Supine/Side - Progress: Goal set today Pt will go Sit to Stand: Independently PT Goal: Sit to Stand - Progress: Goal set today Pt will go Stand to Sit: Independently PT Goal: Stand to Sit - Progress: Goal set today Pt will Ambulate: >150 feet;Independently PT Goal: Ambulate - Progress: Goal set today Pt will Go Up / Down Stairs: 1-2 stairs;with modified independence PT Goal: Up/Down Stairs - Progress: Goal set today Pt will Perform Home Exercise Program: Independently PT Goal: Perform Home Exercise Program - Progress: Goal set today  Visit Information  Last PT Received On: 07/16/12 Assistance Needed: +1    Subjective Data  Subjective: "He was raking leaves last week" per son  Patient Stated Goal: to go home  per wife   Prior Functioning  Home Living Lives With: Spouse  Available Help at Discharge: Family Type of Home: House Home Access: Stairs to enter Secretary/administrator of Steps: 2 Entrance Stairs-Rails: None Home Layout: One level Home Adaptive  Equipment: Straight cane Additional Comments: son lives 2 doors down and daughter lives nearby and can offer assistance Prior Function Level of Independence: Independent Able to Take Stairs?: Yes Communication Communication: HOH    Cognition  Overall Cognitive Status: History of cognitive impairments - at baseline Arousal/Alertness: Awake/alert Orientation Level: Appears intact for tasks assessed Behavior During Session: [facility] for tasks performed Cognition - Other Comments: pt able to follow multi step commands    Extremity/Trunk Assessment Right Lower Extremity Assessment RLE ROM/Strength/Tone: WFL for tasks assessed RLE Sensation: WFL - Light Touch;WFL - Proprioception RLE Coordination: WFL - gross/fine motor Left Lower Extremity Assessment LLE ROM/Strength/Tone: WFL for tasks assessed LLE Sensation: WFL - Light Touch;WFL - Proprioception LLE Coordination: WFL - gross/fine motor Trunk Assessment Trunk Assessment: Normal   Balance Balance Balance Assessed: Yes Static Sitting Balance Static Sitting - Balance Support: No upper extremity supported;Feet supported Static Sitting - Level of Assistance: 7: Independent Static Sitting - Comment/# of Minutes: > 5 minutes Static Standing Balance Static Standing - Balance Support: No upper extremity supported;During functional activity;Bilateral upper extremity supported;Right upper extremity supported;Left upper extremity supported Static Standing - Level of Assistance: 5: Stand by assistance  End of Session PT - End of Session Equipment Utilized During Treatment: Gait belt Activity Tolerance: Patient limited by pain Patient left: in chair;with family/visitor present  GP Functional Assessment Tool Used: clinical judgement Functional Limitation: Mobility: Walking and moving around Mobility: Walking and Moving Around Current Status [phone]): At least 20 percent but less than 40 percent impaired, limited or restricted Mobility: Walking and  Moving Around Goal Status [phone]): 0 percent impaired, limited or restricted   Rosey Bath K. Sargent, [place] [phone] 07/16/2012, 10:11 AM

## 2012-07-16 NOTE — Evaluation (Signed)
Occupational Therapy Evaluation Patient Details Name: Matthew Hodge MRN: [id number] DOB: 03/14/1925 Today's Date: 07/16/2012 Time: 4098-1191 OT Time Calculation (min): 38 min  OT Assessment / Plan / Recommendation Clinical Impression    78 yo male admitted with urinary retention, abdominal and back pain.  He displays some decreased strength and independence with ADL and will benefit from skilled OT services to improve safety and independence with self care tasks.     OT Assessment  Patient needs continued OT Services    Follow Up Recommendations  Home health OT;Supervision/Assistance - 24 hour;Other (comment) (HH aide)    Barriers to Discharge      Equipment Recommendations  Rolling walker with 5" wheels;Other (comment) (pt likely d/c today. noted used RW with PT this am. )    Recommendations for Other Services    Frequency  Min 2X/week    Precautions / Restrictions Precautions Precautions: Fall Precaution Comments: pt reports he has had falls backwards recently Restrictions Weight Bearing Restrictions: No        ADL  Eating/Feeding: Other (comment) (not tested) Grooming: Simulated;Wash/dry hands;Minimal assistance Where Assessed - Grooming: Unsupported standing Upper Body Bathing: Simulated;Chest;Right arm;Left arm;Abdomen;Set up;Supervision/safety Where Assessed - Upper Body Bathing: Unsupported sitting Lower Body Bathing: Simulated;Minimal assistance Where Assessed - Lower Body Bathing: Supported sit to stand Upper Body Dressing: Simulated;Set up Where Assessed - Upper Body Dressing: Unsupported sitting Lower Body Dressing: Simulated;Minimal assistance Where Assessed - Lower Body Dressing: Supported sit to stand Toilet Transfer: Performed;Minimal Web designer: Raised toilet seat with arms (or 3-in-1 over toilet) Toileting - Clothing Manipulation and Hygiene: Simulated;Minimal assistance Where Assessed - Engineer, mining and  Hygiene: Sit to stand from 3-in-1 or toilet Tub/Shower Transfer Method: Not assessed Equipment Used: Rolling walker ADL Comments: Family present including wife, daughter and son. The son and daughter will be available to help  intermittantly at d/c. Wife is always there. Pt supposed to discharge today. Discussed using 3in1 over toilet and in shower as shower chair for increased safety and pt/family verbalize understanding of recommendation.    OT Diagnosis: Generalized weakness  OT Problem List: Decreased strength;Decreased knowledge of use of DME or AE OT Treatment Interventions: Self-care/ADL training;Therapeutic activities;Patient/family education;DME and/or AE instruction   OT Goals Acute Rehab OT Goals OT Goal Formulation: With patient/family Time For Goal Achievement: 07/30/12 Potential to Achieve Goals: Good ADL Goals Pt Will Perform Grooming: with supervision;Standing at sink ADL Goal: Grooming - Progress: Goal set today Pt Will Perform Lower Body Bathing: with supervision;Sit to stand from chair;Sit to stand from bed ADL Goal: Lower Body Bathing - Progress: Goal set today Pt Will Perform Lower Body Dressing: with supervision;Sit to stand from chair;Sit to stand from bed ADL Goal: Lower Body Dressing - Progress: Goal set today Pt Will Transfer to Toilet: with supervision;Ambulation;with DME;3-in-1 ADL Goal: Toilet Transfer - Progress: Goal set today Pt Will Perform Tub/Shower Transfer: with supervision;Shower transfer;with DME ADL Goal: Web designer - Progress: Goal set today  Visit Information  Last OT Received On: 07/16/12 Assistance Needed: +1    Subjective Data  Subjective: pt smiled as OT entered room Patient Stated Goal: none stated by pt but agreeable to get up with OT   Prior Functioning     Home Living Lives With: Spouse Available Help at Discharge: Family Type of Home: House Home Access: Stairs to enter Secretary/administrator of Steps: 2 Entrance  Stairs-Rails: None Home Layout: One level Bathroom Shower/Tub: Health visitor: Handicapped height Home Adaptive  Equipment: Straight cane;Bedside commode/3-in-1 Additional Comments: son lives 2 doors down and daughter lives nearby and can offer assistance Prior Function Level of Independence: Independent Able to Take Stairs?: Yes Communication Communication: HOH;No difficulties         Vision/Perception     Cognition  Overall Cognitive Status: History of cognitive impairments - at baseline Arousal/Alertness: Awake/alert Orientation Level: Appears intact for tasks assessed Behavior During Session: [facility] for tasks performed Cognition - Other Comments: pt able to follow multi step commands    Extremity/Trunk Assessment Right Upper Extremity Assessment RUE ROM/Strength/Tone: [facility] for tasks assessed Left Upper Extremity Assessment LUE ROM/Strength/Tone: WFL for tasks assessed Right Lower Extremity Assessment RLE ROM/Strength/Tone: WFL for tasks assessed RLE Sensation: WFL - Light Touch;WFL - Proprioception RLE Coordination: WFL - gross/fine motor Left Lower Extremity Assessment LLE ROM/Strength/Tone: WFL for tasks assessed LLE Sensation: WFL - Light Touch;WFL - Proprioception LLE Coordination: WFL - gross/fine motor Trunk Assessment Trunk Assessment: Normal     Mobility Bed Mobility Bed Mobility: Supine to Sit;Sit to Supine Supine to Sit: 4: Min guard;HOB flat Sit to Supine: 4: Min guard;HOB flat Details for Bed Mobility Assistance: did better, some increased time Transfers Transfers: Sit to Stand;Stand to Sit Sit to Stand: 4: Min guard;With upper extremity assist;From bed;From chair/3-in-1 Stand to Sit: 4: Min guard;With upper extremity assist;To bed;To chair/3-in-1 Details for Transfer Assistance: min verbal cues for hand placement     Shoulder Instructions     Exercise Other Exercises Other Exercises: pt instructed in beginning core and LE exercise,  given written sheet and copy placed in shadow chart Other Exercises: repeated sit to stand x 5 reps with isometric abdominal, glutes and trunk extension in standing.  Other Exercises: Bilateral hip to hip for core activation in sitting and standing   Balance Balance Balance Assessed: Yes Static Sitting Balance Static Sitting - Balance Support: No upper extremity supported;Feet supported Static Sitting - Level of Assistance: 7: Independent Static Sitting - Comment/# of Minutes: > 5 minutes Static Standing Balance Static Standing - Balance Support: No upper extremity supported;During functional activity;Bilateral upper extremity supported;Right upper extremity supported;Left upper extremity supported Static Standing - Level of Assistance: 5: Stand by assistance Dynamic Standing Balance Dynamic Standing - Level of Assistance: 4: Min assist   End of Session OT - End of Session Equipment Utilized During Treatment: Gait belt Activity Tolerance: Patient tolerated treatment well Patient left: in bed;with call bell/phone within reach;with family/visitor present  GO Functional Assessment Tool Used: clinical judgement Functional Limitation: Self care Self Care Current Status (Z6109): At least 1 percent but less than 20 percent impaired, limited or restricted Self Care Goal Status (U0454): At least 1 percent but less than 20 percent impaired, limited or restricted   Lennox Laity [phone] 07/16/2012, 11:29 AM

## 2012-07-28 ENCOUNTER — Other Ambulatory Visit: Payer: Self-pay | Admitting: Urology

## 2012-07-28 MED ORDER — ACETAMINOPHEN 10 MG/ML IV SOLN: 1000.0000 mg | Freq: Four times a day (QID) | INTRAVENOUS | Status: AC

## 2012-08-04 ENCOUNTER — Encounter (HOSPITAL_COMMUNITY): Payer: Self-pay | Admitting: Pharmacy Technician

## 2012-08-05 NOTE — Progress Notes (Signed)
EKG 07/25/12 on chart, medical clearance note Dr. Tanya Nones on chart

## 2012-08-06 ENCOUNTER — Encounter (HOSPITAL_COMMUNITY)
Admission: RE | Admit: 2012-08-06 | Discharge: 2012-08-06 | Disposition: A | Discharge status: Home / Self Care | Payer: Medicare Other | Source: Ambulatory Visit | Attending: Urology | Admitting: Urology

## 2012-08-06 ENCOUNTER — Encounter (HOSPITAL_COMMUNITY): Payer: Self-pay

## 2012-08-06 LAB — BASIC METABOLIC PANEL
BUN: 21 mg/dL (ref 6–23)
CO2: 28 mEq/L (ref 19–32)
GFR calc non Af Amer: 59 mL/min — ABNORMAL LOW (ref >90)
Glucose, Bld: 91 mg/dL (ref 70–99)
Potassium: 4.7 mEq/L (ref 3.5–5.1)

## 2012-08-06 LAB — SURGICAL PCR SCREEN: Staphylococcus aureus: NEGATIVE

## 2012-08-06 LAB — CBC
HCT: 37.6 % — ABNORMAL LOW (ref 39.0–52.0)
Hemoglobin: 12.7 g/dL — ABNORMAL LOW (ref 13.0–17.0)
MCH: 31.6 pg (ref 26.0–34.0)
MCHC: 33.8 g/dL (ref 30.0–36.0)

## 2012-08-06 NOTE — Patient Instructions (Addendum)
20 CADAN MAGGART  08/06/2012   Your procedure is scheduled on: 08/11/12   Report to [facility] at [phone].  Call this number if you have problems the morning of surgery 336-: [phone]   Remember:   Do not eat food or drink liquids After Midnight.     Take these medicines the morning of surgery with A SIP OF WATER: no medications   Do not wear jewelry, make-up or nail polish.  Do not wear lotions, powders, or perfumes. You may wear deodorant.  Do not shave 48 hours prior to surgery. Men may shave face and neck.  Do not bring valuables to the hospital.  Contacts, dentures or bridgework may not be worn into surgery.  Leave suitcase in the car. After surgery it may be brought to your room.  For patients admitted to the hospital, checkout time is 11:00 AM the day of discharge.    Special Instructions: Shower using CHG 2 nights before surgery and the night before surgery.  If you shower the day of surgery use CHG.  Use special wash - you have one bottle of CHG for all showers.  You should use approximately 1/3 of the bottle for each shower.   Please read over the following fact sheets that you were given: MRSA Information.  Birdie Sons, RN  pre op nurse call if needed [phone]    FAILURE TO FOLLOW THESE INSTRUCTIONS MAY RESULT IN CANCELLATION OF YOUR SURGERY   Patient Signature: ___________________________________________

## 2012-08-06 NOTE — Progress Notes (Signed)
Per note on chart: Matthew Hodge (son) is POA, patient has ALzheimers but functions ok but has a problem with long term memory, does sign his papers Pt has foley catheter

## 2012-08-10 NOTE — Anesthesia Preprocedure Evaluation (Addendum)
Anesthesia Evaluation  Patient identified by MRN, date of birth, ID band Patient awake    Reviewed: Allergy & Precautions, H&P , NPO status , Patient's Chart, lab work & pertinent test results  Airway Mallampati: II TM Distance: >3 FB Neck ROM: full    Dental  (+) Edentulous Upper and Edentulous Lower   Pulmonary neg pulmonary ROS,  breath sounds clear to auscultation  Pulmonary exam normal       Cardiovascular Exercise Tolerance: Good negative cardio ROS  Rhythm:regular Rate:Normal     Neuro/Psych Alzheimer's dementia negative psych ROS   GI/Hepatic negative GI ROS, Neg liver ROS,   Endo/Other  negative endocrine ROS  Renal/GU negative Renal ROS  negative genitourinary   Musculoskeletal   Abdominal   Peds  Hematology negative hematology ROS (+)   Anesthesia Other Findings   Reproductive/Obstetrics negative OB ROS                          Anesthesia Physical Anesthesia Plan  ASA: III  Anesthesia Plan: General   Post-op Pain Management:    Induction: Intravenous  Airway Management Planned: LMA  Additional Equipment:   Intra-op Plan:   Post-operative Plan:   Informed Consent: I have reviewed the patients History and Physical, chart, labs and discussed the procedure including the risks, benefits and alternatives for the proposed anesthesia with the patient or authorized representative who has indicated his/her understanding and acceptance.   Dental Advisory Given  Plan Discussed with: CRNA and Surgeon  Anesthesia Plan Comments:         Anesthesia Quick Evaluation

## 2012-08-11 ENCOUNTER — Encounter (HOSPITAL_COMMUNITY): Payer: Self-pay | Admitting: *Deleted

## 2012-08-11 ENCOUNTER — Ambulatory Visit (HOSPITAL_COMMUNITY): Payer: Medicare Other | Admitting: Anesthesiology

## 2012-08-11 ENCOUNTER — Observation Stay (HOSPITAL_COMMUNITY)
Admission: RE | Admit: 2012-08-11 | Discharge: 2012-08-12 | Disposition: A | Discharge status: Home / Self Care | Payer: Medicare Other | Source: Ambulatory Visit | Attending: Urology | Admitting: Urology

## 2012-08-11 ENCOUNTER — Encounter (HOSPITAL_COMMUNITY)
Admission: RE | Disposition: A | Discharge status: Home / Self Care | Payer: Self-pay | Source: Ambulatory Visit | Attending: Urology

## 2012-08-11 ENCOUNTER — Encounter (HOSPITAL_COMMUNITY): Payer: Self-pay | Admitting: Anesthesiology

## 2012-08-11 DIAGNOSIS — N478 Other disorders of prepuce: Secondary | ICD-10-CM | POA: Insufficient documentation

## 2012-08-11 DIAGNOSIS — G309 Alzheimer's disease, unspecified: Secondary | ICD-10-CM | POA: Insufficient documentation

## 2012-08-11 DIAGNOSIS — N138 Other obstructive and reflux uropathy: Principal | ICD-10-CM | POA: Insufficient documentation

## 2012-08-11 DIAGNOSIS — N471 Phimosis: Secondary | ICD-10-CM | POA: Insufficient documentation

## 2012-08-11 DIAGNOSIS — N139 Obstructive and reflux uropathy, unspecified: Secondary | ICD-10-CM | POA: Insufficient documentation

## 2012-08-11 DIAGNOSIS — F0280 Dementia in other diseases classified elsewhere without behavioral disturbance: Secondary | ICD-10-CM | POA: Insufficient documentation

## 2012-08-11 DIAGNOSIS — N401 Enlarged prostate with lower urinary tract symptoms: Secondary | ICD-10-CM | POA: Insufficient documentation

## 2012-08-11 DIAGNOSIS — Z79899 Other long term (current) drug therapy: Secondary | ICD-10-CM | POA: Insufficient documentation

## 2012-08-11 DIAGNOSIS — Z01812 Encounter for preprocedural laboratory examination: Secondary | ICD-10-CM | POA: Insufficient documentation

## 2012-08-11 DIAGNOSIS — R339 Retention of urine, unspecified: Secondary | ICD-10-CM | POA: Insufficient documentation

## 2012-08-11 DIAGNOSIS — N476 Balanoposthitis: Secondary | ICD-10-CM | POA: Insufficient documentation

## 2012-08-11 HISTORY — PX: INSERTION OF SUPRAPUBIC CATHETER: SHX5870

## 2012-08-11 HISTORY — PX: GREEN LIGHT LASER TURP (TRANSURETHRAL RESECTION OF PROSTATE: SHX6260

## 2012-08-11 HISTORY — PX: CYSTOSCOPY: SHX5120

## 2012-08-11 HISTORY — PX: CIRCUMCISION: SHX1350

## 2012-08-11 SURGERY — GREEN LIGHT LASER TURP (TRANSURETHRAL RESECTION OF PROSTATE
Anesthesia: General | Site: Prostate | Wound class: Clean Contaminated

## 2012-08-11 MED ORDER — ONDANSETRON HCL 4 MG/2ML IJ SOLN
INTRAMUSCULAR | Status: DC | PRN
  Administered 2012-08-11 (×2): 2 mg via INTRAVENOUS

## 2012-08-11 MED ORDER — LACTATED RINGERS IV SOLN
INTRAVENOUS | Status: DC | PRN
  Administered 2012-08-11 (×2): via INTRAVENOUS

## 2012-08-11 MED ORDER — ACETAMINOPHEN 10 MG/ML IV SOLN
INTRAVENOUS | Status: AC
  Filled 2012-08-11: qty 100

## 2012-08-11 MED ORDER — SODIUM CHLORIDE 0.9 % IR SOLN
Status: DC | PRN
  Administered 2012-08-11: 9000 mL

## 2012-08-11 MED ORDER — CEPHALEXIN 500 MG PO CAPS
500.0000 mg | ORAL_CAPSULE | Freq: Three times a day (TID) | ORAL | Status: DC
  Administered 2012-08-11 – 2012-08-12 (×3): 500 mg via ORAL
  Filled 2012-08-11 (×5): qty 1

## 2012-08-11 MED ORDER — CEFAZOLIN SODIUM-DEXTROSE 2-3 GM-% IV SOLR
2.0000 g | INTRAVENOUS | Status: AC
  Administered 2012-08-11: 2 g via INTRAVENOUS

## 2012-08-11 MED ORDER — BELLADONNA ALKALOIDS-OPIUM 16.2-60 MG RE SUPP
RECTAL | Status: AC
  Filled 2012-08-11: qty 1

## 2012-08-11 MED ORDER — HYDROMORPHONE HCL PF 1 MG/ML IJ SOLN
INTRAMUSCULAR | Status: DC | PRN
  Administered 2012-08-11: 0.5 mg via INTRAVENOUS
  Administered 2012-08-11: 1 mg via INTRAVENOUS
  Administered 2012-08-11: 0.5 mg via INTRAVENOUS

## 2012-08-11 MED ORDER — 0.9 % SODIUM CHLORIDE (POUR BTL) OPTIME
TOPICAL | Status: DC | PRN
  Administered 2012-08-11: 1000 mL

## 2012-08-11 MED ORDER — ZOLPIDEM TARTRATE 5 MG PO TABS: 5.0000 mg | ORAL_TABLET | Freq: Every evening | ORAL | Status: DC | PRN

## 2012-08-11 MED ORDER — BISACODYL 5 MG PO TBEC
5.0000 mg | DELAYED_RELEASE_TABLET | Freq: Every day | ORAL | Status: DC | PRN
  Filled 2012-08-11: qty 1

## 2012-08-11 MED ORDER — PROPOFOL 10 MG/ML IV EMUL
INTRAVENOUS | Status: DC | PRN
  Administered 2012-08-11: 20 mg via INTRAVENOUS
  Administered 2012-08-11: 150 mg via INTRAVENOUS
  Administered 2012-08-11: 30 mg via INTRAVENOUS

## 2012-08-11 MED ORDER — HYDROCODONE-ACETAMINOPHEN 5-325 MG PO TABS: 1.0000 | ORAL_TABLET | ORAL | Status: DC | PRN

## 2012-08-11 MED ORDER — POLYETHYLENE GLYCOL 3350 17 G PO PACK
17.0000 g | PACK | Freq: Every day | ORAL | Status: DC
  Administered 2012-08-12: 17 g via ORAL
  Filled 2012-08-11 (×2): qty 1

## 2012-08-11 MED ORDER — LUBIPROSTONE 24 MCG PO CAPS
24.0000 ug | ORAL_CAPSULE | Freq: Two times a day (BID) | ORAL | Status: DC
  Administered 2012-08-12: 24 ug via ORAL
  Filled 2012-08-11 (×4): qty 1

## 2012-08-11 MED ORDER — ONDANSETRON HCL 4 MG/2ML IJ SOLN
4.0000 mg | INTRAMUSCULAR | Status: DC | PRN
  Administered 2012-08-11: 4 mg via INTRAVENOUS
  Filled 2012-08-11: qty 2

## 2012-08-11 MED ORDER — FENTANYL CITRATE 0.05 MG/ML IJ SOLN
INTRAMUSCULAR | Status: DC | PRN
  Administered 2012-08-11: 50 ug via INTRAVENOUS
  Administered 2012-08-11 (×2): 25 ug via INTRAVENOUS
  Administered 2012-08-11 (×2): 50 ug via INTRAVENOUS

## 2012-08-11 MED ORDER — HYDROMORPHONE HCL PF 1 MG/ML IJ SOLN: 0.5000 mg | INTRAMUSCULAR | Status: DC | PRN

## 2012-08-11 MED ORDER — ACETAMINOPHEN 10 MG/ML IV SOLN
INTRAVENOUS | Status: DC | PRN
  Administered 2012-08-11: 1000 mg via INTRAVENOUS

## 2012-08-11 MED ORDER — KETOROLAC TROMETHAMINE 15 MG/ML IJ SOLN
15.0000 mg | Freq: Four times a day (QID) | INTRAMUSCULAR | Status: DC
  Administered 2012-08-11 – 2012-08-12 (×5): 15 mg via INTRAVENOUS
  Filled 2012-08-11 (×7): qty 1

## 2012-08-11 MED ORDER — KETOROLAC TROMETHAMINE 30 MG/ML IJ SOLN
INTRAMUSCULAR | Status: DC | PRN
  Administered 2012-08-11: 15 mg via INTRAVENOUS

## 2012-08-11 MED ORDER — BUPIVACAINE HCL 0.25 % IJ SOLN
INTRAMUSCULAR | Status: DC | PRN
  Administered 2012-08-11: 30 mL

## 2012-08-11 MED ORDER — KETOROLAC TROMETHAMINE 15 MG/ML IJ SOLN
INTRAMUSCULAR | Status: AC
  Filled 2012-08-11: qty 1

## 2012-08-11 MED ORDER — DIPHENHYDRAMINE HCL 50 MG/ML IJ SOLN: 12.5000 mg | Freq: Four times a day (QID) | INTRAMUSCULAR | Status: DC | PRN

## 2012-08-11 MED ORDER — DEXTROSE-NACL 5-0.45 % IV SOLN
INTRAVENOUS | Status: DC
  Administered 2012-08-11 – 2012-08-12 (×2): via INTRAVENOUS

## 2012-08-11 MED ORDER — SODIUM CHLORIDE 0.9 % IR SOLN
Status: DC | PRN
  Administered 2012-08-11: 1000 mL

## 2012-08-11 MED ORDER — BACITRACIN-NEOMYCIN-POLYMYXIN 400-5-5000 EX OINT
1.0000 "application " | TOPICAL_OINTMENT | Freq: Three times a day (TID) | CUTANEOUS | Status: DC | PRN
  Filled 2012-08-11: qty 1

## 2012-08-11 MED ORDER — FENTANYL CITRATE 0.05 MG/ML IJ SOLN: 25.0000 ug | INTRAMUSCULAR | Status: DC | PRN

## 2012-08-11 MED ORDER — ACETAMINOPHEN 10 MG/ML IV SOLN
1000.0000 mg | Freq: Four times a day (QID) | INTRAVENOUS | Status: AC
  Administered 2012-08-11 – 2012-08-12 (×4): 1000 mg via INTRAVENOUS
  Filled 2012-08-11 (×4): qty 100

## 2012-08-11 MED ORDER — LIDOCAINE HCL (CARDIAC) 20 MG/ML IV SOLN
INTRAVENOUS | Status: DC | PRN
  Administered 2012-08-11: 20 mg via INTRAVENOUS

## 2012-08-11 MED ORDER — CEFAZOLIN SODIUM-DEXTROSE 2-3 GM-% IV SOLR
INTRAVENOUS | Status: AC
  Filled 2012-08-11: qty 50

## 2012-08-11 MED ORDER — EPHEDRINE SULFATE 50 MG/ML IJ SOLN
INTRAMUSCULAR | Status: DC | PRN
  Administered 2012-08-11 (×2): 10 mg via INTRAVENOUS

## 2012-08-11 MED ORDER — LACTATED RINGERS IV SOLN: INTRAVENOUS | Status: DC

## 2012-08-11 MED ORDER — PHENYLEPHRINE HCL 10 MG/ML IJ SOLN
INTRAMUSCULAR | Status: DC | PRN
  Administered 2012-08-11: 40 ug via INTRAVENOUS

## 2012-08-11 MED ORDER — DIPHENHYDRAMINE HCL 12.5 MG/5ML PO ELIX
12.5000 mg | ORAL_SOLUTION | Freq: Four times a day (QID) | ORAL | Status: DC | PRN
  Filled 2012-08-11: qty 5

## 2012-08-11 MED ORDER — BUPIVACAINE HCL (PF) 0.25 % IJ SOLN
INTRAMUSCULAR | Status: AC
  Filled 2012-08-11: qty 30

## 2012-08-11 MED ORDER — ESMOLOL HCL 10 MG/ML IV SOLN
INTRAVENOUS | Status: DC | PRN
  Administered 2012-08-11 (×2): 10 mg via INTRAVENOUS
  Administered 2012-08-11: 5 mg via INTRAVENOUS
  Administered 2012-08-11: 10 mg via INTRAVENOUS
  Administered 2012-08-11: 20 mg via INTRAVENOUS
  Administered 2012-08-11: 5 mg via INTRAVENOUS
  Administered 2012-08-11: 10 mg via INTRAVENOUS

## 2012-08-11 SURGICAL SUPPLY — 49 items
BAG URINE DRAINAGE (UROLOGICAL SUPPLIES) ×6 IMPLANT
BAG URINE LEG 500ML (DRAIN) ×4 IMPLANT
BAG URO CATCHER STRL LF (DRAPE) ×4 IMPLANT
BANDAGE COBAN STERILE 2 (GAUZE/BANDAGES/DRESSINGS) ×1 IMPLANT
BANDAGE CONFORM 2  STR LF (GAUZE/BANDAGES/DRESSINGS) ×1 IMPLANT
BLADE SURG 15 STRL LF DISP TIS (BLADE) ×3 IMPLANT
BLADE SURG 15 STRL SS (BLADE) ×8
CATH FOLEY 2WAY SLVR  5CC 18FR (CATHETERS)
CATH FOLEY 2WAY SLVR 30CC 22FR (CATHETERS) ×1 IMPLANT
CATH FOLEY 2WAY SLVR 5CC 18FR (CATHETERS) IMPLANT
CATH SILICONE 5CC 18FR (INSTRUMENTS) ×1 IMPLANT
CLOTH BEACON ORANGE TIMEOUT ST (SAFETY) ×7 IMPLANT
COUNTER NEEDLE 20 DBL MAG RED (NEEDLE) ×1 IMPLANT
COVER SURGICAL LIGHT HANDLE (MISCELLANEOUS) ×8 IMPLANT
DRAPE CAMERA CLOSED 9X96 (DRAPES) ×4 IMPLANT
DRAPE PED LAPAROTOMY (DRAPES) ×1 IMPLANT
ELECT BUTTON HF 24-28F 2 30DE (ELECTRODE) IMPLANT
ELECT LOOP MED HF 24F 12D (CUTTING LOOP) IMPLANT
ELECT LOOP MED HF 24F 12D CBL (CLIP) IMPLANT
ELECT REM PT RETURN 9FT ADLT (ELECTROSURGICAL) ×8
ELECT RESECT VAPORIZE 12D CBL (ELECTRODE) IMPLANT
ELECTRODE REM PT RTRN 9FT ADLT (ELECTROSURGICAL) ×3 IMPLANT
FEE RENTAL LASER GREENLIGHT (Laser) IMPLANT
GAUZE SPONGE 4X4 16PLY XRAY LF (GAUZE/BANDAGES/DRESSINGS) ×1 IMPLANT
GLOVE BIOGEL M STRL SZ7.5 (GLOVE) ×4 IMPLANT
GLOVE SURG SS PI 8.0 STRL IVOR (GLOVE) ×4 IMPLANT
GOWN STRL REIN XL XLG (GOWN DISPOSABLE) ×8 IMPLANT
HOLDER FOLEY CATH W/STRAP (MISCELLANEOUS) IMPLANT
KIT SUPRAPUBIC CATH (MISCELLANEOUS) ×4 IMPLANT
LASER FIBER /GREENLIGHT LASER (Laser) ×1 IMPLANT
LASER GREENLIGHT RENTAL P/PROC (Laser) ×4 IMPLANT
MANIFOLD NEPTUNE II (INSTRUMENTS) ×4 IMPLANT
NEEDLE HYPO 22GX1.5 SAFETY (NEEDLE) ×2 IMPLANT
NS IRRIG 1000ML POUR BTL (IV SOLUTION) ×4 IMPLANT
PACK CYSTO (CUSTOM PROCEDURE TRAY) ×4 IMPLANT
PACK GENERAL/GYN (CUSTOM PROCEDURE TRAY) ×1 IMPLANT
PENCIL BUTTON HOLSTER BLD 10FT (ELECTRODE) IMPLANT
PLUG CATH AND CAP STER (CATHETERS) ×1 IMPLANT
SPONGE GAUZE 4X4 12PLY (GAUZE/BANDAGES/DRESSINGS) ×1 IMPLANT
SUT ETHILON 3 0 PS 1 (SUTURE) ×4 IMPLANT
SUT VIC AB 3-0 SH 27 (SUTURE) ×16
SUT VIC AB 3-0 SH 27XBRD (SUTURE) IMPLANT
SYR 30ML LL (SYRINGE) IMPLANT
SYR CONTROL 10ML LL (SYRINGE) ×1 IMPLANT
SYRINGE 10CC LL (SYRINGE) ×1 IMPLANT
SYRINGE IRR TOOMEY STRL 70CC (SYRINGE) IMPLANT
TOWEL OR 17X26 10 PK STRL BLUE (TOWEL DISPOSABLE) ×4 IMPLANT
TUBING CONNECTING 10 (TUBING) ×4 IMPLANT
WATER STERILE IRR 3000ML UROMA (IV SOLUTION) ×4 IMPLANT

## 2012-08-11 NOTE — Op Note (Signed)
Pre-operative diagnosis :  Balanitis, paraphimosis, BPH, chronic urinary obstruction  Postoperative diagnosis:  Same  Operation:  Dorsal slit circumcision, insertion suprapubic catheter with cystoscopy, greenlight laser vaporization of the prostate  Surgeon:  S. Patsi Sears, MD  First assistant:  None  Anesthesia:  general  Preparation:  After appropriate preanesthesia, the patient was brought to the operating room, placed on the operating table in dorsal supine position, where general LMA anesthesia was introduced. IV Tylenol was given, and the armband was double checked. He was placed in the dorsal lithotomy position with pubis was prepped with Betadine solution and draped in usual fashion.  Review history:  Urinary retention, recurrent, i 78 yo male, with chronic urinary retention. He has acute severe paraphimosis, which is recuced in the office, and which will need circumcision. he also has trilobar BPH, and I have discussed this in detail with the patient's wife and son. WE have disdcussed the brain's control over the bladder and the role of the prostate and bladder outlet obstruction. I am not sure if TURP will allow him to void again normally, but the family insists that he was urinating on his won within the last month. For this reason, we have discussed placement of s-p tube and Green light laser vaporization of trough ( not concerned at 87 and Alzheimer's for complete TURP), as well as circumcision-with 23 hr observation. Other option would be to simply leave a foley catheter in place. Wife is upset, and family wants to try to let pt void normally on his own again.  Plan  Acute Urinary Retention (788.20)  1. Complex Uroflowmetry  2. PVR U/S  1. Stop Flomax because of dizziness ( ? cause)  2. Schedule Green light vaporization of prostate; circumcision; suprapubic catheter.    Statement of  Likelihood of Success: Excellent. TIME-OUT observed.:  Procedure:  Genitourinary inspection  revealed that the patient had severe balanitis and the foreskin was noted to be trapped behind the glans, with edema the glans, and the foreskin. The patient had had no complaints of this preoperatively, but was noted have Alzheimer's disease. This required squeezing of the glans, and of the foreskin, in order to reduce the severe paraphimosis. I then used a blue marking pen to outline the pericarinal incision. Pericarinal and periglandular incisions were then made, and the foreskin was removed and discarded. 4 separate quadrants of 3-0 Vicryl suture were then created. A frenular attachment was noted to cause ventral curvature of the glans, and this was clamped, and incised with the electrosurgical unit. Horizontal mattress 3-0 Vicryl suture was then used to secure the frenular attachment.  Each quadrant was then closed with interrupted 3-0 Vicryl suture. Bleeding was controlled with the electrosurgical unit. 20 cc of Marcaine 25 plain were injected into the base of the penis.  The patient was then placed in the Trendelenburg position, and a Lowsley tractor was placed the bladder. A cutdown was accomplished over the Lowsley tractor, and the tractor was then brought into the wound. A nonlatex 18 French 10 cc catheter was then placed in the bladder. Cystoscopy was accomplished to ensure that the catheter was in good position the balloon was inflated with 10 cc of water. Photodocumentation was accomplished. Photodocumentation was further accomplished of trabeculation, cellule formation, and trilobar BPH. No large median lobe was identified.  A single 3-0 Vicryl suture was used to secure the subcutaneous tissue and the skin was closed with a single 3-0 nylon suture. The suprapubic tube was plugged.  Attention was  then directed to the greenlight laser area the laser fiber was placed through the scope, with continuous irrigation. Following this, with saline irrigation, laser was accomplished, at the level of  bladder neck using 80 W power, on both the left and right sides. A trench was accomplished at the 7:00 position, from the bladder neck, to the Vero. A trans-was accomplished from the 5:00 position, out to the Vero. The prostatic base was then vaporized. Lateral lobes were also vaporized. With excellent trough and lateral lobe vaporization, the vera was identified. No bleeding was noted. The laser was room withdrawn, and a size 20 Foley catheter with 30 cc balloon was placed. No traction or irrigation was necessary.  The patient was awakened taken recovery in good condition. He was given 15 mg of IV Toradol.

## 2012-08-11 NOTE — Anesthesia Postprocedure Evaluation (Signed)
  Anesthesia Post-op Note  Patient: Matthew Hodge  Procedure(s) Performed: Procedure(s) (LRB): GREEN LIGHT LASER TURP (TRANSURETHRAL RESECTION OF PROSTATE (N/A) INSERTION OF SUPRAPUBIC CATHETER (N/A) CIRCUMCISION ADULT (N/A) CYSTOSCOPY (N/A)  Patient Location: PACU  Anesthesia Type: General  Level of Consciousness: awake and alert   Airway and Oxygen Therapy: Patient Spontanous Breathing  Post-op Pain: mild  Post-op Assessment: Post-op Vital signs reviewed, Patient's Cardiovascular Status Stable, Respiratory Function Stable, Patent Airway and No signs of Nausea or vomiting  Last Vitals:  Filed Vitals:   08/11/12 1145  BP: 180/91  Pulse: 95  Temp:   Resp: 12    Post-op Vital Signs: stable   Complications: No apparent anesthesia complications

## 2012-08-11 NOTE — H&P (Signed)
hief Complaint  [facility]   Reason For Visit  Cystoscopy, flowrate, PVR and PUS   Active Problems Problems  1. Acute Urinary Retention 788.20  History of Present Illness      78 yo male with Alzheimer's presents today for cystoscopy, flowrate, PVR and PUS.  He was recently seen in the hospital on 07/15/12 for consultation for hx of urinary retention.  Patient presented to the ER for several days of lower abdominal pain & back pain.  He had urgency, frequency & hx of chronic constipation.  A foley was placed with 1 liter of urine return.  He is currenlty on Flomax.    07/04/12  CT showed enlarged prostate and several colonic diverticula.  07/15/12  Creatinine - 2.98   Past Medical History Problems  1. History of  Arthritis V13.4 2. History of  Cancer 199.1  Surgical History Problems  1. History of  Cataract Surgery  Current Meds 1. Amitiza CAPS; Therapy: (Recorded:03Dec2013) to 2. Cipro 500 MG Oral Tablet; Therapy: (Recorded:03Dec2013) to 3. Tamsulosin HCl 0.4 MG Oral Capsule; Therapy: (Recorded:03Dec2013) to  Allergies Medication  1. No Known Drug Allergies  Family History Problems  1. Family history of  Family Health Status Number Of Children 1 daughter and 1 son 2. Family history of  Father Deceased At Age ____ 3. Family history of  Mother Deceased At Age ____  Social History Problems  1. Caffeine Use 1/2-1 cup every few days 2. Marital History - Currently Married 3. Never A Smoker Denied  4. History of  Alcohol Use  Review of Systems Genitourinary, constitutional, skin, eye, otolaryngeal, hematologic/lymphatic, cardiovascular, pulmonary, endocrine, musculoskeletal, gastrointestinal, neurological and psychiatric system(s) were reviewed and pertinent findings if present are noted.  Genitourinary: urinary frequency, feelings of urinary urgency, dysuria, nocturia, incontinence, difficulty starting the urinary stream, incomplete emptying of  bladder and initiating urination requires straining.  Gastrointestinal: abdominal pain, diarrhea and constipation.  Integumentary: skin rash/lesion.  Neurological: dizziness.    Vitals Vital Signs [Data Includes: Last 1 Day]  03Dec2013 03:49PM  BMI Calculated: 26.22 BSA Calculated: 1.96 Height: 5 ft 9 in Weight: 177 lb  Blood Pressure: 165 / 77 Temperature: 98.1 F Heart Rate: 91  Physical Exam Rectal: Rectal exam demonstrates absent sphincter tone and the anus is normal on inspection. Estimated prostate size is 3+.  Genitourinary: Examination of the penis demonstrates no discharge, no masses, no lesions and a normal meatus. The penis is uncircumcised. The scrotum is without lesions. The right epididymis is palpably normal and non-tender. The left epididymis is palpably normal and non-tender. The right testis is non-tender and without masses. The left testis is non-tender and without masses. Severe paraphimosis.    Procedure     PUS - 72.75 grams.  Patient was unable to urinate, therefore a 29fr foley was reinserted.  Stressed the importance to pt's son that foreskin must stay pulled down while a foley is in place.   Procedure: Cystoscopy  Chaperone Present: Hassel Neth, CMA.  Indication: Lower Urinary Tract Symptoms.  Informed Consent: Risks, benefits, and potential adverse events were discussed and informed consent was obtained from the patient.  Prep: The patient was prepped with betadine.  Anesthesia:. Local anesthesia was administered intraurethrally with 2% lidocaine jelly.  Antibiotic prophylaxis: Ciprofloxacin.  Procedure Note:  Urethral meatus:. No abnormalities.  Anterior urethra: No abnormalities.  Prostatic urethra:. The lateral and median prostatic lobes were enlarged. An enlarged intravesical median lobe was visualized.  Bladder: Visulization was clear. The ureteral  orifices were in the normal anatomic position bilaterally. Examination of the bladder demonstrated  trabeculation, but no diverticulum edema located at the base of the bladder and cellules, but no ulcer. The patient tolerated the procedure well.  Complications: None.    Assessment Assessed  1. Acute Urinary Retention 788.20      Urinary retention, recurrent, i 78 yo male, with chronic urinary retention. He has acute severe paraphimosis, which is recuced in the office, and which will need circumcision. he also has trilobar BPH, and I have discussed this in detail with the patient's wife and son. WE have disdcussed the brain's control over the bladder and the role of the prostate and bladder outlet obstruction. I am not sure if TURP will allow him to void again normally, but the family insists that he was urinating on his won within the last month. For this reason, we have discussed placement of s-p tube and Green light laser vaporization of trough ( not concerned at 87 and Alzheimer's for complete TURP), as well as circumcision-with 23 hr observation. Other option would be to simply leave a foley catheter in place. Wife is upset, and family wants to try to let pt void normally on his own again.   Plan Acute Urinary Retention (788.20)  1. Complex Uroflowmetry 2. PVR U/S      1. Stop Flomax because of dizziness ( ? cause) 2. Schedule Green light  vaporization of prostate; circumcision; suprapubic catheter.   Signatures Electronically signed by : Jethro Bolus, M.D.; Jul 22 2012  5:36PM

## 2012-08-11 NOTE — Interval H&P Note (Signed)
History and Physical Interval Note:  08/11/2012 8:49 AM  Matthew Hodge  has presented today for surgery, with the diagnosis of BPH with retention   The various methods of treatment have been discussed with the patient and family. After consideration of risks, benefits and other options for treatment, the patient has consented to  Procedure(s) (LRB) with comments: GREEN LIGHT LASER TURP (TRANSURETHRAL RESECTION OF PROSTATE (N/A) - Burna Mortimer Laser of Prostate, Circumcision  INSERTION OF SUPRAPUBIC CATHETER (N/A) as a surgical intervention .  The patient's history has been reviewed, patient examined, no change in status, stable for surgery.  I have reviewed the patient's chart and labs.  Questions were answered to the patient's satisfaction.     Jethro Bolus I

## 2012-08-11 NOTE — Transfer of Care (Signed)
Immediate Anesthesia Transfer of Care Note  Patient: Matthew Hodge  Procedure(s) Performed: Procedure(s) (LRB) with comments: GREEN LIGHT LASER TURP (TRANSURETHRAL RESECTION OF PROSTATE (N/A) - Burna Mortimer Laser of Prostate INSERTION OF SUPRAPUBIC CATHETER (N/A) CIRCUMCISION ADULT (N/A) - DORSAL SLIT CIRCUMCISION CYSTOSCOPY (N/A)  Patient Location: PACU  Anesthesia Type:General  Level of Consciousness: awake and sedated  Airway & Oxygen Therapy: Patient Spontanous Breathing and Patient connected to face mask oxygen  Post-op Assessment: Report given to PACU RN and Post -op Vital signs reviewed and stable  Post vital signs: Reviewed and stable  Complications: No apparent anesthesia complications

## 2012-08-12 ENCOUNTER — Encounter (HOSPITAL_COMMUNITY): Payer: Self-pay | Admitting: Urology

## 2012-08-12 MED ORDER — CEPHALEXIN 500 MG PO CAPS: 500.0000 mg | ORAL_CAPSULE | Freq: Two times a day (BID) | ORAL | Status: DC

## 2012-08-12 MED ORDER — TRAMADOL-ACETAMINOPHEN 37.5-325 MG PO TABS: 1.0000 | ORAL_TABLET | Freq: Four times a day (QID) | ORAL | Status: DC | PRN

## 2012-08-12 NOTE — Progress Notes (Signed)
Called to see pt. Went and spoke with family and patient. Concerned about pt not being out of bed. This RN ambulated patient around the entire unit with stand by assist. Pt iniatlly c/o of dizziness. After sitting on the side of the bed for 3 minutes. No further c/o of dizziness was voiced. Family receptive to taking patient home with home health services. They were concerned that it may be a gap in the agency providing services. I spoke with CM and she made contact with the agency Liaison to come and talk with the family. Provided with unit leadership contact information and the requested my contact information as well. Also provided as well.. Pt. Receptive and feels safe with this discharge plan. [facility]waiting [facility] Lia[facility]. MD updated

## 2012-08-12 NOTE — Progress Notes (Signed)
Urology Progress Note  1 Day Post-Op   Subjective: post op: 1. Circumcision for acute paraphimosis, balanitis                                  2. Suprapubic tube placement                                  3. Greenlight Laser prostate  Pt awake, and alert. + memory issues. Grandson in room. Dressings removed. Wounds look good.      No acute urologic events overnight. Ambulation:   negative Flatus:    negative Bowel movement  negative  Pain: complete resolution  Objective:  Blood pressure 146/65, pulse 75, temperature 97.8 F (36.6 C), temperature source Oral, resp. rate 18, height 6' (1.829 m), weight 78.42 kg (172 lb 14.2 oz), SpO2 100.00%.  Physical Exam:  General:  No acute distress, awake Resp: clear to auscultation bilaterally Extremities: extremities normal, atraumatic, no cyanosis or edema Genitourinary:  Penile wound clean and dry.  Foley: pink tinged, with occasional clot.     I/O last 3 completed shifts: In: 2406.3 [I.V.:2106.3; IV Piggyback:300] Out: 945 [Urine:945]  No results found for this basename: HGB:2,WBC:2,PLT:2 in the last 72 hours  No results found for this basename: NA:2,K:2,CL:2,CO2:2,BUN:2,CREATININE:2,CALCIUM:2,MAGNESIUM:2,GFRNONAA:2,GFRAA:2 in the last 72 hours   No results found for this basename: PT:2,INR:2,APTT:2 in the last 72 hours   No components found with this basename: ABG:2  Assessment/Plan:  Catheter not removed. Wound care discussed. Pt is to increase activities as tolerated. Follow up in 2 days for foley cath removal, and to be sure daughter understands how to use s-p tube. Marland Kitchen

## 2012-08-12 NOTE — Discharge Summary (Signed)
Physician Discharge Summary  Patient ID: Matthew Hodge MRN: [id number] DOB/AGE: [birth year] 78 y.o.  Admit date: 08/11/2012 Discharge date: 08/12/2012  Admission Diagnoses: BPH with retention   Discharge Diagnoses:  Paraphimosis, balanitis, acute and chronic urinary retention  Discharged Condition  stable  Hospital Course:   Circumcision, s-p tube, Burna Mortimer Laser  Consults: None  Significant Diagnostic Studies: No results found.  Treatments: surgery}  Discharge Exam: Blood pressure 146/65, pulse 75, temperature 97.8 F (36.6 C), temperature source Oral, resp. rate 18, height 6' (1.829 m), weight 78.42 kg (172 lb 14.2 oz), SpO2 100.00%. General appearance: alert and cooperative  Disposition: Home. RTC for foley removal. Keflex.   Discharge Orders    Future Orders Please Complete By Expires   Diet - low sodium heart healthy      Increase activity slowly      Discharge instructions      Comments:   Call KIM at [phone] for time to return to office on Thursday for Foley cath removal, and instructions on how to use suprapubic tube.   Discharge wound care:      Comments:   Ok to shower.  Suprapubic wound has water resistant dressing: Skin suture will need removal Circumcision has dissolvable sutures. ( 3 weeks).   Continue foley catheter      Discontinue IV      Discharge patient          Medication List     As of 08/12/2012  8:09 AM    TAKE these medications         acetaminophen 500 MG tablet   Commonly known as: TYLENOL   Take 500 mg by mouth every 6 (six) hours as needed. For pain.      cephALEXin 500 MG capsule   Commonly known as: KEFLEX   Take 1 capsule (500 mg total) by mouth 2 (two) times daily.      lubiprostone 24 MCG capsule   Commonly known as: AMITIZA   Take 24 mcg by mouth 2 (two) times daily with a meal.      polyethylene glycol packet   Commonly known as: MIRALAX / GLYCOLAX   Take 17 g by mouth daily.      traMADol-acetaminophen  37.5-325 MG per tablet   Commonly known as: ULTRACET   Take 1 tablet by mouth every 6 (six) hours as needed for pain.           Follow-up Information    Follow up with Boen Sterbenz I, MD. In 2 days. (call KIM @ [phone] for Thursday appointment for Foley cath removal and suprapubic catheter training)    Contact information:   [address] [place] [address] [phone]          Signed: Jethro Bolus I 08/12/2012, 8:09 AM

## 2012-08-12 NOTE — Progress Notes (Signed)
Patient is active with [facility] as prior to admission; Kristian RN with [facility] in to talk to patient about DCP; B Shelba Flake

## 2012-08-12 NOTE — Progress Notes (Signed)
Patient discharged home with wife/son, discharge instructions given and explain to patient by [facility] charge nurse and they verbalized understanding. PAtient denies any pain/distress. Accompanied home by wife/son. Foley inplace per MD order F/U outpatient with MD. Surgical incision clean/dry and intact no S/sx of infection noted. No order skin issue noted.

## 2012-11-21 DIAGNOSIS — F0280 Dementia in other diseases classified elsewhere without behavioral disturbance: Secondary | ICD-10-CM

## 2012-11-21 DIAGNOSIS — R339 Retention of urine, unspecified: Secondary | ICD-10-CM

## 2012-11-21 DIAGNOSIS — N401 Enlarged prostate with lower urinary tract symptoms: Secondary | ICD-10-CM

## 2012-11-21 DIAGNOSIS — G309 Alzheimer's disease, unspecified: Secondary | ICD-10-CM

## 2012-11-21 DIAGNOSIS — Z435 Encounter for attention to cystostomy: Secondary | ICD-10-CM

## 2012-11-25 DIAGNOSIS — R339 Retention of urine, unspecified: Secondary | ICD-10-CM

## 2012-11-25 DIAGNOSIS — Z87440 Personal history of urinary (tract) infections: Secondary | ICD-10-CM

## 2012-11-25 DIAGNOSIS — N401 Enlarged prostate with lower urinary tract symptoms: Secondary | ICD-10-CM

## 2012-11-25 DIAGNOSIS — Z435 Encounter for attention to cystostomy: Secondary | ICD-10-CM

## 2013-01-19 ENCOUNTER — Encounter: Payer: Self-pay | Admitting: Family Medicine

## 2013-01-19 ENCOUNTER — Ambulatory Visit (INDEPENDENT_AMBULATORY_CARE_PROVIDER_SITE_OTHER): Payer: Medicare Other | Admitting: Family Medicine

## 2013-01-19 VITALS — BP 130/70 | HR 78 | Temp 98.0°F | Resp 14 | Wt 182.0 lb

## 2013-01-19 DIAGNOSIS — R5381 Other malaise: Secondary | ICD-10-CM

## 2013-01-19 DIAGNOSIS — Z9181 History of falling: Secondary | ICD-10-CM

## 2013-01-19 DIAGNOSIS — F0391 Unspecified dementia with behavioral disturbance: Secondary | ICD-10-CM

## 2013-01-19 DIAGNOSIS — G309 Alzheimer's disease, unspecified: Secondary | ICD-10-CM | POA: Insufficient documentation

## 2013-01-19 DIAGNOSIS — N390 Urinary tract infection, site not specified: Secondary | ICD-10-CM

## 2013-01-19 DIAGNOSIS — R531 Weakness: Secondary | ICD-10-CM

## 2013-01-19 DIAGNOSIS — R296 Repeated falls: Secondary | ICD-10-CM

## 2013-01-19 LAB — URINALYSIS, ROUTINE W REFLEX MICROSCOPIC
Bilirubin Urine: NEGATIVE
Glucose, UA: NEGATIVE mg/dL
Ketones, ur: NEGATIVE mg/dL
Nitrite: POSITIVE — AB
Specific Gravity, Urine: 1.015 (ref 1.005–1.030)
pH: 7 (ref 5.0–8.0)

## 2013-01-19 LAB — COMPLETE METABOLIC PANEL WITH GFR
ALT: <8 U/L (ref 0–53)
Albumin: 3.6 g/dL (ref 3.5–5.2)
Alkaline Phosphatase: 66 U/L (ref 39–117)
Glucose, Bld: 93 mg/dL (ref 70–99)
Potassium: 5.5 mEq/L — ABNORMAL HIGH (ref 3.5–5.3)
Sodium: 136 mEq/L (ref 135–145)
Total Bilirubin: 0.5 mg/dL (ref 0.3–1.2)
Total Protein: 6.5 g/dL (ref 6.0–8.3)

## 2013-01-19 LAB — CBC WITH DIFFERENTIAL/PLATELET
Basophils Relative: 0 % (ref 0–1)
Eosinophils Absolute: 0.2 10*3/uL (ref 0.0–0.7)
Hemoglobin: 12.5 g/dL — ABNORMAL LOW (ref 13.0–17.0)
Lymphs Abs: 1.4 10*3/uL (ref 0.7–4.0)
MCH: 30.9 pg (ref 26.0–34.0)
MCHC: 34.8 g/dL (ref 30.0–36.0)
Monocytes Relative: 13 % — ABNORMAL HIGH (ref 3–12)
Neutro Abs: 7.8 10*3/uL — ABNORMAL HIGH (ref 1.7–7.7)
Neutrophils Relative %: 72 % (ref 43–77)
Platelets: 303 10*3/uL (ref 150–400)
RBC: 4.05 MIL/uL — ABNORMAL LOW (ref 4.22–5.81)

## 2013-01-19 LAB — URINALYSIS, MICROSCOPIC ONLY
Casts: NONE SEEN
Crystals: NONE SEEN

## 2013-01-19 MED ORDER — CIPROFLOXACIN HCL 500 MG PO TABS: 500.0000 mg | ORAL_TABLET | Freq: Two times a day (BID) | ORAL | Status: DC

## 2013-01-19 NOTE — Progress Notes (Signed)
Subjective:    Patient ID: Matthew Hodge, male    DOB: [birth year], 79 y.o.   MRN: [id number]  HPI  Patient is an 79 year old white male with a history of BPH and urinary retention. He is a suprapubic catheter placed in January. He gets his catheter changed every month. He is almost due to be changed. Over the last few months, his family has noted progressively worsening weakness, increasing falls and worsening confusion. However this is acutely worse in the last week. He can now stand only by holding onto a walker. His become very weak. He is hallucinating.  He has paranoid delusions that people are watching him through his window.  They deny fevers chillsl. They deny any nausea vomiting or diarrhea. Denying sneezing or rhinorrhea. He denies vertigo. Past Medical History  Diagnosis Date  . Alzheimer's disease   . Skin cancer   . Chronic constipation    No current outpatient prescriptions on file prior to visit.   No current facility-administered medications on file prior to visit.   No Known Allergies History   Social History  . Marital Status: Married    Spouse Name: N/A    Number of Children: 2  . Years of Education: N/A   Occupational History  . Retired Automotive engineer    Social History Main Topics  . Smoking status: Never Smoker   . Smokeless tobacco: Never Used  . Alcohol Use: No  . Drug Use: No  . Sexually Active: No   Other Topics Concern  . Not on file   Social History Narrative   Married.  Lives in [place] with his wife.  Ambulates with a cane.       Review of Systems  All other systems reviewed and are negative.       Objective:   Physical Exam  Vitals reviewed. Constitutional: He appears well-developed and well-nourished.  HENT:  Right Ear: External ear normal.  Left Ear: External ear normal.  Mouth/Throat: Oropharynx is clear and moist. No oropharyngeal exudate.  Eyes: Conjunctivae and EOM are normal.  Neck: Normal range  of motion. Neck supple.  Cardiovascular: Normal rate, regular rhythm and normal heart sounds.   No murmur heard. Pulmonary/Chest: Effort normal and breath sounds normal. No respiratory distress. He has no wheezes. He has no rales.  Abdominal: Soft. Bowel sounds are normal. He exhibits no distension. There is no tenderness. There is no rebound.  Lymphadenopathy:    He has no cervical adenopathy.  Neurological: He is alert. He has normal reflexes. He displays normal reflexes. No cranial nerve deficit. He exhibits normal muscle tone. Coordination normal.   suprapubic catheter site shows no evidence of cellulitis.         Assessment & Plan:  1. Subacute delirium This could represent worsening of his dementia. However we need to rule out reversible causes such as infections and strokes.  This is particularly true given his chronic indwelling Foley catheter. I will send a urinalysis, CBC, CMP. If the above labs are normal proceed with an MRI of the brain. If there is no evidence of stroke then I would proceed with physical therapy and begin medications for dementia including Aricept, Namenda and when necessary Haldol.  Urinalysis reveals nitrates, therefore I will begin empiric therapy for UTI with Cipro 500 mg pobid for 10 days.  Await Culture and proceed with labs and MRI. - CBC with Differential - COMPLETE METABOLIC PANEL WITH GFR - Urinalysis, Routine w reflex microscopic  2. Dementia with behavioral disturbance Rule out reversible causes such as urinary tract infections and metabolic dyscrasias. No reversible causes begin medicines mentioned in problem 1.  3. Weakness generalized Again rule out reversible causes. If there are no reversible causes consult physical therapy.

## 2013-01-19 NOTE — Addendum Note (Signed)
Addended by: WRAY, [place] on: 01/19/2013 12:22 PM   Modules accepted: Orders

## 2013-01-20 LAB — URINE CULTURE

## 2013-01-27 ENCOUNTER — Other Ambulatory Visit: Payer: Self-pay | Admitting: Family Medicine

## 2013-01-27 DIAGNOSIS — Z139 Encounter for screening, unspecified: Secondary | ICD-10-CM

## 2013-01-28 ENCOUNTER — Ambulatory Visit
Admission: RE | Admit: 2013-01-28 | Discharge: 2013-01-28 | Disposition: A | Discharge status: Home / Self Care | Payer: Medicare Other | Source: Ambulatory Visit | Attending: Family Medicine | Admitting: Family Medicine

## 2013-01-28 ENCOUNTER — Ambulatory Visit
Admission: RE | Admit: 2013-01-28 | Discharge: 2013-01-28 | Disposition: A | Discharge status: Home / Self Care | Payer: PRIVATE HEALTH INSURANCE | Source: Ambulatory Visit | Attending: Family Medicine | Admitting: Family Medicine

## 2013-01-28 DIAGNOSIS — Z139 Encounter for screening, unspecified: Secondary | ICD-10-CM

## 2013-01-28 DIAGNOSIS — R296 Repeated falls: Secondary | ICD-10-CM

## 2013-01-28 DIAGNOSIS — R531 Weakness: Secondary | ICD-10-CM

## 2013-01-28 MED ORDER — GADOBENATE DIMEGLUMINE 529 MG/ML IV SOLN
17.0000 mL | Freq: Once | INTRAVENOUS | Status: AC | PRN
  Administered 2013-01-28: 17 mL via INTRAVENOUS

## 2013-02-02 DIAGNOSIS — Z435 Encounter for attention to cystostomy: Secondary | ICD-10-CM

## 2013-02-02 DIAGNOSIS — F0280 Dementia in other diseases classified elsewhere without behavioral disturbance: Secondary | ICD-10-CM

## 2013-02-02 DIAGNOSIS — N401 Enlarged prostate with lower urinary tract symptoms: Secondary | ICD-10-CM

## 2013-02-02 DIAGNOSIS — G309 Alzheimer's disease, unspecified: Secondary | ICD-10-CM

## 2013-02-02 DIAGNOSIS — R339 Retention of urine, unspecified: Secondary | ICD-10-CM

## 2013-02-16 ENCOUNTER — Telehealth: Payer: Self-pay | Admitting: Family Medicine

## 2013-02-16 DIAGNOSIS — R531 Weakness: Secondary | ICD-10-CM

## 2013-02-17 MED ORDER — DONEPEZIL HCL 5 MG PO TABS: 5.0000 mg | ORAL_TABLET | Freq: Every evening | ORAL | Status: DC | PRN

## 2013-02-17 NOTE — Telephone Encounter (Signed)
Pts daughter is aware of MRI results and the need for physical therapy. Referral made and rx sent to pharmacy.

## 2013-03-15 DIAGNOSIS — N138 Other obstructive and reflux uropathy: Secondary | ICD-10-CM

## 2013-03-15 DIAGNOSIS — G309 Alzheimer's disease, unspecified: Secondary | ICD-10-CM

## 2013-03-15 DIAGNOSIS — R339 Retention of urine, unspecified: Secondary | ICD-10-CM

## 2013-03-15 DIAGNOSIS — F0280 Dementia in other diseases classified elsewhere without behavioral disturbance: Secondary | ICD-10-CM

## 2013-03-15 DIAGNOSIS — Z435 Encounter for attention to cystostomy: Secondary | ICD-10-CM

## 2013-03-15 DIAGNOSIS — N401 Enlarged prostate with lower urinary tract symptoms: Secondary | ICD-10-CM

## 2013-05-14 DIAGNOSIS — Z435 Encounter for attention to cystostomy: Secondary | ICD-10-CM

## 2013-05-14 DIAGNOSIS — F0280 Dementia in other diseases classified elsewhere without behavioral disturbance: Secondary | ICD-10-CM

## 2013-05-14 DIAGNOSIS — G309 Alzheimer's disease, unspecified: Secondary | ICD-10-CM

## 2013-05-14 DIAGNOSIS — R339 Retention of urine, unspecified: Secondary | ICD-10-CM

## 2013-05-14 DIAGNOSIS — N401 Enlarged prostate with lower urinary tract symptoms: Secondary | ICD-10-CM

## 2013-06-25 ENCOUNTER — Telehealth: Payer: Self-pay | Admitting: Family Medicine

## 2013-06-25 NOTE — Telephone Encounter (Signed)
Patient saw Dr. Patsi Sears yesterday and he is sending out a nurse to evaluate him . Please call daughter with more information.

## 2013-06-26 NOTE — Telephone Encounter (Signed)
Can we call daughter and get more information, I'll try to answer any questions she may have.

## 2013-06-26 NOTE — Telephone Encounter (Signed)
Daughter had questions about Home health and nursing home placement.  Questions were answered

## 2013-07-10 ENCOUNTER — Telehealth: Payer: Self-pay | Admitting: Family Medicine

## 2013-07-10 NOTE — Telephone Encounter (Signed)
Nursing orders : [facility] , PT , Social Work   This is for his Therapist, music.   Amy  [phone]

## 2013-07-13 DIAGNOSIS — N401 Enlarged prostate with lower urinary tract symptoms: Secondary | ICD-10-CM

## 2013-07-13 DIAGNOSIS — R339 Retention of urine, unspecified: Secondary | ICD-10-CM

## 2013-07-13 DIAGNOSIS — G309 Alzheimer's disease, unspecified: Secondary | ICD-10-CM

## 2013-07-13 DIAGNOSIS — Z435 Encounter for attention to cystostomy: Secondary | ICD-10-CM

## 2013-07-13 DIAGNOSIS — F0280 Dementia in other diseases classified elsewhere without behavioral disturbance: Secondary | ICD-10-CM

## 2013-07-14 NOTE — Telephone Encounter (Signed)
LMOVM giving verbal ok for all and if they need written orders will be glad to do as well

## 2013-07-17 ENCOUNTER — Telehealth: Payer: Self-pay | Admitting: Family Medicine

## 2013-07-17 NOTE — Telephone Encounter (Signed)
Larey Seat again last night. No apparent injuries.  Gets dizzy when gets up.  Daughter wants [place] to send referral to neuro rehab for gait and balance.  Then noted patient has not been seen in awhile, so made appt for him to be seen first.

## 2013-07-24 ENCOUNTER — Ambulatory Visit (INDEPENDENT_AMBULATORY_CARE_PROVIDER_SITE_OTHER): Payer: Medicare Other | Admitting: Family Medicine

## 2013-07-24 ENCOUNTER — Encounter: Payer: Self-pay | Admitting: Family Medicine

## 2013-07-24 VITALS — BP 128/70 | HR 86 | Temp 97.9°F | Resp 12 | Wt 190.0 lb

## 2013-07-24 DIAGNOSIS — R296 Repeated falls: Secondary | ICD-10-CM

## 2013-07-24 DIAGNOSIS — F03918 Unspecified dementia, unspecified severity, with other behavioral disturbance: Secondary | ICD-10-CM

## 2013-07-24 DIAGNOSIS — Z23 Encounter for immunization: Secondary | ICD-10-CM

## 2013-07-24 DIAGNOSIS — Z9181 History of falling: Secondary | ICD-10-CM

## 2013-07-24 DIAGNOSIS — F0391 Unspecified dementia with behavioral disturbance: Secondary | ICD-10-CM

## 2013-07-24 NOTE — Progress Notes (Signed)
Subjective:    Patient ID: Matthew Hodge, male    DOB: 30-May-1925, 79 y.o.   MRN: [id number]  HPI Patient is here today for followup. He has moderate to severe dementia. He has chronic indwelling Foley catheter due to obstructive uropathy.  He falls numerous times every week even with walking using a walker. Pain free he has not injured himself. His family continues to refuse Aricept or Namenda.  He is due for his Prevnar 13 immunization.  He denies any chest pain shortness of breath dyspnea on exertion fevers cough dysuria or low-back pain. Past Medical History  Diagnosis Date  . Alzheimer's disease   . Skin cancer   . Chronic constipation    Past Surgical History  Procedure Laterality Date  . Hand tendon surgery    . Cataract extraction, bilateral    . Skin cancer removal forehead    . Green light laser turp (transurethral resection of prostate  08/11/2012    Procedure: GREEN LIGHT LASER TURP (TRANSURETHRAL RESECTION OF PROSTATE;  Surgeon: Kathi Ludwig, MD;  Location: [facility];  Service: Urology;  Laterality: N/A;  Burna Mortimer Laser of Prostate  . Insertion of suprapubic catheter  08/11/2012    Procedure: INSERTION OF SUPRAPUBIC CATHETER;  Surgeon: Kathi Ludwig, MD;  Location: [facility];  Service: Urology;  Laterality: N/A;  . Circumcision  08/11/2012    Procedure: CIRCUMCISION ADULT;  Surgeon: Kathi Ludwig, MD;  Location: [facility];  Service: Urology;  Laterality: N/A;  DORSAL SLIT CIRCUMCISION  . Cystoscopy  08/11/2012    Procedure: CYSTOSCOPY;  Surgeon: Kathi Ludwig, MD;  Location: [facility];  Service: Urology;  Laterality: N/A;   No current outpatient prescriptions on file prior to visit.   No current facility-administered medications on file prior to visit.   No Known Allergies History   Social History  . Marital Status: Married    Spouse Name: N/A    Number of Children: 2  . Years of Education: N/A   Occupational History  . Retired Proofreader    Social History Main Topics  . Smoking status: Never Smoker   . Smokeless tobacco: Never Used  . Alcohol Use: No  . Drug Use: No  . Sexual Activity: No   Other Topics Concern  . Not on file   Social History Narrative   Married.  Lives in [place] with his wife.  Ambulates with a cane.        Review of Systems  All other systems reviewed and are negative.       Objective:   Physical Exam  Vitals reviewed. Cardiovascular: Normal rate and regular rhythm.   Pulmonary/Chest: Effort normal and breath sounds normal.  Neurological: He is alert. No cranial nerve deficit. He exhibits abnormal muscle tone. Coordination normal.  Psychiatric: He has a normal mood and affect. His behavior is normal.   patient is alert and oriented to person. He is walking with a walker due to weakness in his legs. Otherwise he is very confused and is unable to add much to the history or discussion.          Assessment & Plan:  Need for prophylactic vaccination and inoculation against unspecified single disease - Plan: Pneumococcal conjugate vaccine 13-valent IM  Falls frequently - Plan: Ambulatory referral to Physical Therapy  Dementia with behavioral disturbance - Plan: Ambulatory referral to Physical Therapy  Recommended Prevnar 13 today which the family consented to. I recommended trying  Aricept and Namenda the family refused. They're willing to consult physical therapy and occupational therapy to try to help prevent falls. They also interested in a speech therapy consultation to make sure the patient is not aspirating. I will schedule these as soon as possible.

## 2013-08-27 ENCOUNTER — Encounter (HOSPITAL_COMMUNITY): Payer: Self-pay | Admitting: Emergency Medicine

## 2013-08-27 ENCOUNTER — Emergency Department (HOSPITAL_COMMUNITY): Payer: Medicare Other

## 2013-08-27 ENCOUNTER — Inpatient Hospital Stay (HOSPITAL_COMMUNITY)
Admission: EM | Admit: 2013-08-27 | Discharge: 2013-09-01 | DRG: 062 | Disposition: A | Discharge status: Rehabilitation Facility | Payer: Medicare Other | Attending: Neurology | Admitting: Neurology

## 2013-08-27 DIAGNOSIS — R4701 Aphasia: Secondary | ICD-10-CM

## 2013-08-27 DIAGNOSIS — I6789 Other cerebrovascular disease: Secondary | ICD-10-CM

## 2013-08-27 DIAGNOSIS — IMO0002 Reserved for concepts with insufficient information to code with codable children: Secondary | ICD-10-CM

## 2013-08-27 DIAGNOSIS — R29898 Other symptoms and signs involving the musculoskeletal system: Secondary | ICD-10-CM

## 2013-08-27 DIAGNOSIS — I6340 Cerebral infarction due to embolism of unspecified cerebral artery: Principal | ICD-10-CM | POA: Diagnosis present

## 2013-08-27 DIAGNOSIS — Z85828 Personal history of other malignant neoplasm of skin: Secondary | ICD-10-CM

## 2013-08-27 DIAGNOSIS — I6350 Cerebral infarction due to unspecified occlusion or stenosis of unspecified cerebral artery: Secondary | ICD-10-CM

## 2013-08-27 DIAGNOSIS — I639 Cerebral infarction, unspecified: Secondary | ICD-10-CM | POA: Diagnosis present

## 2013-08-27 DIAGNOSIS — Z823 Family history of stroke: Secondary | ICD-10-CM

## 2013-08-27 DIAGNOSIS — F0280 Dementia in other diseases classified elsewhere without behavioral disturbance: Secondary | ICD-10-CM

## 2013-08-27 DIAGNOSIS — G309 Alzheimer's disease, unspecified: Secondary | ICD-10-CM | POA: Diagnosis present

## 2013-08-27 DIAGNOSIS — G8190 Hemiplegia, unspecified affecting unspecified side: Secondary | ICD-10-CM | POA: Diagnosis present

## 2013-08-27 LAB — POCT I-STAT TROPONIN I: Troponin i, poc: 0.02 ng/mL (ref 0.00–0.08)

## 2013-08-27 LAB — COMPREHENSIVE METABOLIC PANEL
ALT: 10 U/L (ref 0–53)
AST: 18 U/L (ref 0–37)
Albumin: 3.8 g/dL (ref 3.5–5.2)
Alkaline Phosphatase: 91 U/L (ref 39–117)
BILIRUBIN TOTAL: 0.4 mg/dL (ref 0.3–1.2)
BUN: 19 mg/dL (ref 6–23)
CHLORIDE: 98 meq/L (ref 96–112)
CO2: 26 mEq/L (ref 19–32)
CREATININE: 1.09 mg/dL (ref 0.50–1.35)
Calcium: 9.1 mg/dL (ref 8.4–10.5)
GFR calc Af Amer: 69 mL/min — ABNORMAL LOW (ref >90)
GFR calc non Af Amer: 59 mL/min — ABNORMAL LOW (ref >90)
Glucose, Bld: 106 mg/dL — ABNORMAL HIGH (ref 70–99)
Potassium: 4.7 mEq/L (ref 3.7–5.3)
Sodium: 137 mEq/L (ref 137–147)
Total Protein: 7.3 g/dL (ref 6.0–8.3)

## 2013-08-27 LAB — CBC
HEMATOCRIT: 40.6 % (ref 39.0–52.0)
HEMOGLOBIN: 13.8 g/dL (ref 13.0–17.0)
MCH: 31.6 pg (ref 26.0–34.0)
MCHC: 34 g/dL (ref 30.0–36.0)
MCV: 92.9 fL (ref 78.0–100.0)
Platelets: 311 10*3/uL (ref 150–400)
RBC: 4.37 MIL/uL (ref 4.22–5.81)
RDW: 12.9 % (ref 11.5–15.5)
WBC: 7.6 10*3/uL (ref 4.0–10.5)

## 2013-08-27 LAB — POCT I-STAT, CHEM 8
BUN: 21 mg/dL (ref 6–23)
CREATININE: 1.1 mg/dL (ref 0.50–1.35)
Calcium, Ion: 1.2 mmol/L (ref 1.13–1.30)
Chloride: 100 mEq/L (ref 96–112)
GLUCOSE: 107 mg/dL — AB (ref 70–99)
HEMATOCRIT: 44 % (ref 39.0–52.0)
HEMOGLOBIN: 15 g/dL (ref 13.0–17.0)
Potassium: 4.6 mEq/L (ref 3.7–5.3)
SODIUM: 137 meq/L (ref 137–147)
TCO2: 27 mmol/L (ref 0–100)

## 2013-08-27 LAB — DIFFERENTIAL
BASOS ABS: 0 10*3/uL (ref 0.0–0.1)
Basophils Relative: 0 % (ref 0–1)
EOS ABS: 0.2 10*3/uL (ref 0.0–0.7)
Eosinophils Relative: 3 % (ref 0–5)
Lymphocytes Relative: 17 % (ref 12–46)
Lymphs Abs: 1.3 10*3/uL (ref 0.7–4.0)
MONO ABS: 0.6 10*3/uL (ref 0.1–1.0)
MONOS PCT: 8 % (ref 3–12)
Neutro Abs: 5.4 10*3/uL (ref 1.7–7.7)
Neutrophils Relative %: 71 % (ref 43–77)

## 2013-08-27 LAB — GLUCOSE, CAPILLARY
Glucose-Capillary: 98 mg/dL (ref 70–99)
Glucose-Capillary: 102 mg/dL — ABNORMAL HIGH (ref 70–99)

## 2013-08-27 LAB — PROTIME-INR
INR: 1 (ref 0.00–1.49)
Prothrombin Time: 13 seconds (ref 11.6–15.2)

## 2013-08-27 LAB — APTT: APTT: 31 s (ref 24–37)

## 2013-08-27 LAB — MRSA PCR SCREENING: MRSA BY PCR: NEGATIVE

## 2013-08-27 LAB — TROPONIN I

## 2013-08-27 MED ORDER — ACETAMINOPHEN 325 MG PO TABS: 650.0000 mg | ORAL_TABLET | ORAL | Status: DC | PRN

## 2013-08-27 MED ORDER — SENNOSIDES-DOCUSATE SODIUM 8.6-50 MG PO TABS
1.0000 | ORAL_TABLET | Freq: Every evening | ORAL | Status: DC | PRN
  Administered 2013-08-31 – 2013-09-01 (×2): 1 via ORAL
  Filled 2013-08-27 (×2): qty 1

## 2013-08-27 MED ORDER — LABETALOL HCL 5 MG/ML IV SOLN
10.0000 mg | INTRAVENOUS | Status: DC | PRN
  Administered 2013-08-27: 10 mg via INTRAVENOUS
  Filled 2013-08-27 (×2): qty 4

## 2013-08-27 MED ORDER — PANTOPRAZOLE SODIUM 40 MG IV SOLR
40.0000 mg | Freq: Every day | INTRAVENOUS | Status: DC
  Administered 2013-08-27 – 2013-08-28 (×2): 40 mg via INTRAVENOUS
  Filled 2013-08-27 (×3): qty 40

## 2013-08-27 MED ORDER — SODIUM CHLORIDE 0.9 % IV SOLN
INTRAVENOUS | Status: DC
  Administered 2013-08-27: 12:00:00 via INTRAVENOUS

## 2013-08-27 MED ORDER — SODIUM CHLORIDE 0.9 % IV SOLN
INTRAVENOUS | Status: DC
  Administered 2013-08-27 – 2013-08-29 (×2): 75 mL/h via INTRAVENOUS
  Administered 2013-08-29 – 2013-09-01 (×3): via INTRAVENOUS

## 2013-08-27 MED ORDER — ALTEPLASE (STROKE) FULL DOSE INFUSION
0.9000 mg/kg | Freq: Once | INTRAVENOUS | Status: AC
  Administered 2013-08-27: 77 mg via INTRAVENOUS
  Filled 2013-08-27: qty 77

## 2013-08-27 MED ORDER — ACETAMINOPHEN 650 MG RE SUPP: 650.0000 mg | RECTAL | Status: DC | PRN

## 2013-08-27 NOTE — ED Provider Notes (Signed)
Medical screening examination/treatment/procedure(s) were conducted as a shared visit with resident-physician practitioner(s) and myself.  I personally evaluated the patient during the encounter.  Pt is a 80 y.o. male with pmhx as above presenting as code CVA, last seen nml 10.  Pt found to have aphasia and R sided weakness  which seems to be improving upon arrival though still present.  CT head unremarkable.  Neurology suspects MCA territory CVA, feel he is good tPA candidate which will be started.  Pt admitted to neuro ICU.   1. Aphasia   2. Lower extremity weakness   3. Upper extremity weakness   4. Stroke      EKG Interpretation    Date/Time:  Thursday August 27 2013 11:30:29 EST Ventricular Rate:  80 PR Interval:  206 QRS Duration: 90 QT Interval:  389 QTC Calculation: 449 R Axis:   -33 Text Interpretation:  Sinus rhythm borderline L axis deviation with 1st degree A-V block Confirmed by DOCHERTY  MD, MEGAN ([id number]) on 08/27/2013 11:56:31 AM              Matthew Ehlers, MD 08/28/13 1041

## 2013-08-27 NOTE — ED Notes (Signed)
Report given to 3 MW nurse

## 2013-08-27 NOTE — Progress Notes (Signed)
VASCULAR LAB PRELIMINARY  PRELIMINARY  PRELIMINARY  PRELIMINARY  Carotid Dopplers completed.    Preliminary report:  1-39% ICA stenosis.  Vertebral artery flow is antegrade.  Madalena Kesecker, RVT 08/27/2013, 2:44 PM

## 2013-08-27 NOTE — Code Documentation (Signed)
80yo male arriving to [facility] via [facility] at 1110.  Patient was reported to have been seen at his baseline at 1000 by family.  He had sudden onset right sided weakness and was unable to speak.  Family waited a few minutes to call EMS as patient has been having "staring spells" that resolve.  Patient takes no medications per family.  Has sustained falls and is receiving physical therapy for generalized weakness, but lives at home and is able to ambulate and complete ADLs.  History of bladder infections requiring suprapubic catheter to be placed in December 2013.  Patient taken to CT on arrival.  NIHSS 13 on arrival, see documentation for details.  Patient will state name and yes to questions at times, but is otherwise nonverbal.  Patient continues to have right hemiparesis.  Dr. Nicole Kindred at bedside to discuss care with family.  Order to give tPA by MD, tPA started at 1150.  See code stroke log for times and documentation.  Bedside handoff with ED RN Santiago Glad.

## 2013-08-27 NOTE — ED Provider Notes (Signed)
CSN: [id number]     Arrival date & time 08/27/13  1110 History   First MD Initiated Contact with Patient 08/27/13 1114     Chief Complaint  Patient presents with  . Code Stroke   (Consider location/radiation/quality/duration/timing/severity/associated sxs/prior Treatment) Patient is a 80 y.o. male presenting with neurologic complaint.  Neurologic Problem This is a new problem. The current episode started today (1000). The problem occurs constantly. The problem has been unchanged. Associated symptoms comments: Unable to be assessed. Nonverbal.. Nothing aggravates the symptoms. He has tried nothing for the symptoms.    Past Medical History  Diagnosis Date  . Alzheimer's disease   . Skin cancer   . Chronic constipation    Past Surgical History  Procedure Laterality Date  . Hand tendon surgery    . Cataract extraction, bilateral    . Skin cancer removal forehead    . Green light laser turp (transurethral resection of prostate  08/11/2012    Procedure: GREEN LIGHT LASER TURP (TRANSURETHRAL RESECTION OF PROSTATE;  Surgeon: Ailene Rud, MD;  Location: [facility];  Service: Urology;  Laterality: N/A;  Julianne Rice Laser of Prostate  . Insertion of suprapubic catheter  08/11/2012    Procedure: INSERTION OF SUPRAPUBIC CATHETER;  Surgeon: Ailene Rud, MD;  Location: [facility];  Service: Urology;  Laterality: N/A;  . Circumcision  08/11/2012    Procedure: CIRCUMCISION ADULT;  Surgeon: Ailene Rud, MD;  Location: [facility];  Service: Urology;  Laterality: N/A;  DORSAL SLIT CIRCUMCISION  . Cystoscopy  08/11/2012    Procedure: CYSTOSCOPY;  Surgeon: Ailene Rud, MD;  Location: [facility];  Service: Urology;  Laterality: N/A;   Family History  Problem Relation Age of Onset  . Stroke Mother   . Stroke Father   . COPD Brother   . Heart disease Brother   . Diabetes Brother    History  Substance Use Topics  . Smoking status: Never Smoker   . Smokeless tobacco: Never Used   . Alcohol Use: No    Review of Systems  Unable to perform ROS: Patient nonverbal    Allergies  Review of patient's allergies indicates no known allergies.  Home Medications  No current outpatient prescriptions on file. BP 171/77  Pulse 83  Resp 16  Ht 6' (1.829 m)  Wt 189 lb 9.5 oz (86 kg)  BMI 25.71 kg/m2  SpO2 98% Physical Exam  Constitutional: He is oriented to person, place, and time. He appears well-developed and well-nourished. No distress.  HENT:  Head: Normocephalic and atraumatic.  Eyes: Pupils are equal, round, and reactive to light.  Neck: Normal range of motion.  Cardiovascular: Normal rate and regular rhythm.   Pulmonary/Chest: Effort normal and breath sounds normal.  Abdominal: Soft. He exhibits no distension. There is no tenderness.  Musculoskeletal: Normal range of motion.  Neurological: He is alert and oriented to person, place, and time. He exhibits abnormal muscle tone (weakness of RLE, RUE). GCS eye subscore is 4. GCS verbal subscore is 3. GCS motor subscore is 6.  Skin: Skin is warm. He is not diaphoretic.    ED Course  Procedures (including critical care time) Labs Review Labs Reviewed  COMPREHENSIVE METABOLIC PANEL - Abnormal; Notable for the following:    Glucose, Bld 106 (*)    GFR calc non Af Amer 59 (*)    GFR calc Af Amer 69 (*)    All other components within normal limits  POCT I-STAT, CHEM 8 - Abnormal; Notable  for the following:    Glucose, Bld 107 (*)    All other components within normal limits  MRSA PCR SCREENING  APTT  CBC  DIFFERENTIAL  TROPONIN I  PROTIME-INR  GLUCOSE, CAPILLARY  POCT I-STAT TROPONIN I   Imaging Review Ct Head Wo Contrast  08/27/2013   CLINICAL DATA:  Code stroke right-sided weakness  EXAM: CT HEAD WITHOUT CONTRAST  TECHNIQUE: Contiguous axial images were obtained from the base of the skull through the vertex without intravenous contrast.  COMPARISON:  CT 01/05/2009.  MRI 01/28/2013.  FINDINGS: Moderate  atrophy.  This has progressed since 2010.  Mild chronic microvascular ischemic change in the white matter. No acute infarct. Negative for hemorrhage or mass. Arachnoid cyst left middle cranial fossa is unchanged.  Fluid in the right mastoid tip.  Negative for skull fracture.  IMPRESSION: Progression of atrophy since 2010.  Marland Kitchen  No acute abnormality.   Electronically Signed   By: Franchot Gallo M.D.   On: 08/27/2013 11:29    EKG Interpretation    Date/Time:  Thursday August 27 2013 11:30:29 EST Ventricular Rate:  80 PR Interval:  206 QRS Duration: 90 QT Interval:  389 QTC Calculation: 449 R Axis:   -33 Text Interpretation:  Sinus rhythm borderline L axis deviation with 1st degree A-V block Confirmed by DOCHERTY  MD, MEGAN ([id number]) on 08/27/2013 11:56:31 AM           MDM   1. Aphasia   2. Lower extremity weakness   3. Upper extremity weakness     Matthew Hodge is a 79 y.o. male who presents to the ED who presents to the ED as a CODE STROKE for neurologic deficits, including aphasia, extremity weakness. Symptoms started at approximately 1000. Patient met in the hallway, evaluated by myself and by the stroke team RN/MD. Patient to the CT scanner urgently.   CT scan demonstrates no acute findings. Patient with continued aphasia, only able to say simple words. Per family, patient was walking and talking normally yesterday. Neurology evaluated patient, and felt appropriate for tPA. Patient given tPA in the ED, and transferred to the ICU in critical condition. HDS throughout stay in the ED. Patient seen and evaluated by myself and my attending, Dr. Tawnya Crook.       Freddi Che, MD 08/27/13 [phone]

## 2013-08-27 NOTE — ED Notes (Signed)
To ED via [facility] from home with family stating had sudden onset of right sided weakness -- last seen normal at 1000. Lives with wife, neighbor helps take care of patient at home-- caregiver states that pt had a fall recently and has been getting home health and home physical therapy. On arrival-- alert, follows commands-- see NIH stroke screen.

## 2013-08-27 NOTE — Progress Notes (Signed)
  Echocardiogram 2D Echocardiogram has been performed.  Matthew Hodge 08/27/2013, 3:52 PM

## 2013-08-27 NOTE — H&P (Signed)
Referring Physician: Tawnya Crook    Chief Complaint:  Stroke  HPI:                                                                                                                                         Matthew Hodge is an 80 y.o. male who takes no home medications. Patient was noted to be normal this AM, had breakfast, talkative and moving all extremities. At 10 AM his neighbor (who helps care for him) noted patient walked to the chair and sat down.  HE had a blank stare on his face and was no communicative.  He was also noted to have right arm and leg weakness. EMS was called and patient was brought as a code stroke.  On arrival patient would answer questions with "yes" otherwise aphasic. He showed right arm and leg hemiparesis. Due to significant aphasia and right hemiparesis tPA was ordered.   OF note: wife states he has had periods in the past where he will stare into space and tremble.  These episodes, however, have never presented with prolonged inability to speak or right arm and leg weakness.   Date last known well: Date: 08/27/2013 Time last known well: Time: 10:00 tPA Given: Yes  Past Medical History  Diagnosis Date  . Alzheimer's disease   . Skin cancer   . Chronic constipation     Past Surgical History  Procedure Laterality Date  . Hand tendon surgery    . Cataract extraction, bilateral    . Skin cancer removal forehead    . Green light laser turp (transurethral resection of prostate  08/11/2012    Procedure: GREEN LIGHT LASER TURP (TRANSURETHRAL RESECTION OF PROSTATE;  Surgeon: Ailene Rud, MD;  Location: [facility];  Service: Urology;  Laterality: N/A;  Julianne Rice Laser of Prostate  . Insertion of suprapubic catheter  08/11/2012    Procedure: INSERTION OF SUPRAPUBIC CATHETER;  Surgeon: Ailene Rud, MD;  Location: [facility];  Service: Urology;  Laterality: N/A;  . Circumcision  08/11/2012    Procedure: CIRCUMCISION ADULT;  Surgeon: Ailene Rud, MD;   Location: [facility];  Service: Urology;  Laterality: N/A;  DORSAL SLIT CIRCUMCISION  . Cystoscopy  08/11/2012    Procedure: CYSTOSCOPY;  Surgeon: Ailene Rud, MD;  Location: [facility];  Service: Urology;  Laterality: N/A;    Family History  Problem Relation Age of Onset  . Stroke Mother   . Stroke Father   . COPD Brother   . Heart disease Brother   . Diabetes Brother    Social History:  reports that he has never smoked. He has never used smokeless tobacco. He reports that he does not drink alcohol or use illicit drugs.  Allergies: No Known Allergies  Medications:  No home medications  ROS:                                                                                                                                       History obtained from wife and neighbor  General ROS: negative for - chills, fatigue, fever, night sweats, weight gain or weight loss Psychological ROS: negative for - behavioral disorder, hallucinations, memory difficulties, mood swings or suicidal ideation Ophthalmic ROS: negative for - blurry vision, double vision, eye pain or loss of vision ENT ROS: negative for - epistaxis, nasal discharge, oral lesions, sore throat, tinnitus or vertigo Allergy and Immunology ROS: negative for - hives or itchy/watery eyes Hematological and Lymphatic ROS: negative for - bleeding problems, bruising or swollen lymph nodes Endocrine ROS: negative for - galactorrhea, hair pattern changes, polydipsia/polyuria or temperature intolerance Respiratory ROS: negative for - cough, hemoptysis, shortness of breath or wheezing Cardiovascular ROS: negative for - chest pain, dyspnea on exertion, edema or irregular heartbeat Gastrointestinal ROS: negative for - abdominal pain, diarrhea, hematemesis, nausea/vomiting or stool incontinence Genito-Urinary ROS: negative for -  dysuria, hematuria, incontinence or urinary frequency/urgency Musculoskeletal ROS: negative for - joint swelling or muscular weakness Neurological ROS: as noted in HPI Dermatological ROS: negative for rash and skin lesion changes  Neurologic Examination:                                                                                                      Blood pressure 164/81, pulse 83, resp. rate 18, weight 86 kg (189 lb 9.5 oz), SpO2 99.00%.  General: Skin W/D/I Lungs: CTA bilaterally ABD: Soft NT/ND  Mental Status: Alert, Speech shows expressive > receptive aphasia.  Able to follow one step commands. Cranial Nerves: II: Discs flat bilaterally; Visual fields blinks to threat bilaterally, pupils equal, round, reactive to light and accommodation III,IV, VI: ptosis not present, extra-ocular motions intact bilaterally V,VII: smile symmetric, facial light touch sensation normal bilaterally VIII: hearing normal bilaterally IX,X: gag reflex present XI: bilateral shoulder shrug XII: midline tongue extension without atrophy or fasciculations  Motor: Right : Upper extremity   5/5    Left:     Upper extremity   2/5 (distal > proximal)  Lower extremity   5/5     Lower extremity   3/5  Tone and bulk:normal tone throughout; no atrophy noted Sensory: Pinprick and light touch intact throughout, bilaterally Deep Tendon Reflexes:  Right: Upper Extremity   Left: Upper extremity   biceps (C-5 to  C-6) 2/4   biceps (C-5 to C-6) 2/4 tricep (C7) 2/4    triceps (C7) 2/4 Brachioradialis (C6) 2/4  Brachioradialis (C6) 2/4  Lower Extremity Lower Extremity  quadriceps (L-2 to L-4) 2/4   quadriceps (L-2 to L-4) 2/4 Achilles (S1) 2/4   Achilles (S1) 2/4  Plantars: Right: downgoing   Left: downgoing Cerebellar: normal finger-to-nose on left,  Unable to perform heel-to-shin test Gait: not tested CV: pulses palpable throughout    Results for orders placed during the hospital encounter of 08/27/13  (from the past 48 hour(s))  GLUCOSE, CAPILLARY     Status: None   Collection Time    08/27/13 11:26 AM      Result Value Range   Glucose-Capillary 98  70 - 99 mg/dL  POCT I-STAT TROPONIN I     Status: None   Collection Time    08/27/13 11:31 AM      Result Value Range   Troponin i, poc 0.02  0.00 - 0.08 ng/mL   Comment 3            Comment: Due to the release kinetics of cTnI,     a negative result within the first hours     of the onset of symptoms does not rule out     myocardial infarction with certainty.     If myocardial infarction is still suspected,     repeat the test at appropriate intervals.  POCT I-STAT, CHEM 8     Status: Abnormal   Collection Time    08/27/13 11:33 AM      Result Value Range   Sodium 137  137 - 147 mEq/L   Potassium 4.6  3.7 - 5.3 mEq/L   Chloride 100  96 - 112 mEq/L   BUN 21  6 - 23 mg/dL   Creatinine, Ser 1.10  0.50 - 1.35 mg/dL   Glucose, Bld 107 (*) 70 - 99 mg/dL   Calcium, Ion 1.20  1.13 - 1.30 mmol/L   TCO2 27  0 - 100 mmol/L   Hemoglobin 15.0  13.0 - 17.0 g/dL   HCT 44.0  39.0 - 52.0 %   Ct Head Wo Contrast  08/27/2013   CLINICAL DATA:  Code stroke right-sided weakness  EXAM: CT HEAD WITHOUT CONTRAST  TECHNIQUE: Contiguous axial images were obtained from the base of the skull through the vertex without intravenous contrast.  COMPARISON:  CT 01/05/2009.  MRI 01/28/2013.  FINDINGS: Moderate atrophy.  This has progressed since 2010.  Mild chronic microvascular ischemic change in the white matter. No acute infarct. Negative for hemorrhage or mass. Arachnoid cyst left middle cranial fossa is unchanged.  Fluid in the right mastoid tip.  Negative for skull fracture.  IMPRESSION: Progression of atrophy since 2010.  Marland Kitchen  No acute abnormality.   Electronically Signed   By: Franchot Gallo M.D.   On: 08/27/2013 11:29    Assessment and plan discussed with with attending physician and they are in agreement.    Stroke Risk Factors - none  Etta Quill  PA-C [facility] [phone]  08/27/2013, 11:46 AM   Assessment: 80 y.o. male presenting with aphasia, right arm and leg hemiplegia.  Although CT head negative for CVA patient likely is suffering from a left parietal CVA.  tPA was started while in the ED.  Patient will be admitted to ICU with frequent neuro checks.      Plan: 1. HgbA1c, fasting lipid panel 2. Repeat CT head in AM, MRI, MRA  of the brain without contrast 3. PT consult, OT consult, Speech consult 4. Echocardiogram 5. Carotid dopplers 6. Risk  Factor modification   I personally participate in this patient's evaluation and management, including neurological examination as well as formulated clinical assessment and management recommendations. Patient's workup required complex clinical decision-making, including thrombolytic therapy acutely, as well as admission to neuro intensive care unit. Total critical care time was 90 minutes.  Rush Farmer M.D. [facility] [phone]

## 2013-08-28 ENCOUNTER — Inpatient Hospital Stay (HOSPITAL_COMMUNITY): Payer: Medicare Other

## 2013-08-28 LAB — GLUCOSE, CAPILLARY
GLUCOSE-CAPILLARY: 96 mg/dL (ref 70–99)
Glucose-Capillary: 99 mg/dL (ref 70–99)
Glucose-Capillary: 88 mg/dL (ref 70–99)

## 2013-08-28 LAB — LIPID PANEL
Cholesterol: 181 mg/dL (ref 0–200)
HDL: 41 mg/dL (ref >39)
LDL Cholesterol: 126 mg/dL — ABNORMAL HIGH (ref 0–99)
TRIGLYCERIDES: 72 mg/dL (ref <150)
Total CHOL/HDL Ratio: 4.4 RATIO
VLDL: 14 mg/dL (ref 0–40)

## 2013-08-28 LAB — HEMOGLOBIN A1C
HEMOGLOBIN A1C: 6 % — AB (ref <5.7)
Mean Plasma Glucose: 126 mg/dL — ABNORMAL HIGH (ref <117)

## 2013-08-28 MED ORDER — ASPIRIN 325 MG PO TABS
325.0000 mg | ORAL_TABLET | Freq: Every day | ORAL | Status: DC
  Administered 2013-08-28 – 2013-09-01 (×5): 325 mg via ORAL
  Filled 2013-08-28 (×5): qty 1

## 2013-08-28 MED ORDER — SIMVASTATIN 20 MG PO TABS
20.0000 mg | ORAL_TABLET | Freq: Every day | ORAL | Status: DC
  Administered 2013-08-28 – 2013-08-31 (×4): 20 mg via ORAL
  Filled 2013-08-28 (×5): qty 1

## 2013-08-28 NOTE — Evaluation (Signed)
Physical Therapy Evaluation Patient Details Name: Matthew Hodge MRN: [id number] DOB: 04/30/1925 Today's Date: 08/28/2013 Time: 2671-2458 PT Time Calculation (min): 32 min  PT Assessment / Plan / Recommendation History of Present Illness  Pt adm with lt CVA and given TPA.  Clinical Impression  Pt admitted with above. Pt currently with functional limitations due to the deficits listed below (see PT Problem List).  Pt will benefit from skilled PT to increase their independence and safety with mobility to allow discharge to CIR prior to return home with supportive family.     PT Assessment  Patient needs continued PT services    Follow Up Recommendations  CIR    Does the patient have the potential to tolerate intense rehabilitation      Barriers to Discharge        Equipment Recommendations  Other (comment) (TBD.)    Recommendations for Other Services     Frequency Min 4X/week    Precautions / Restrictions Precautions Precautions: Fall   Pertinent Vitals/Pain See flow sheet.      Mobility  Bed Mobility Overal bed mobility: Needs Assistance Bed Mobility: Supine to Sit;Sit to Supine Supine to sit: Total assist;HOB elevated Sit to supine: Total assist;HOB elevated General bed mobility comments: Assist to move rt leg and to elevate/lower trunk.    Exercises     PT Diagnosis: Difficulty walking;Generalized weakness;Hemiplegia dominant side  PT Problem List: Decreased strength;Decreased activity tolerance;Decreased balance;Decreased mobility;Decreased knowledge of use of DME PT Treatment Interventions: DME instruction;Gait training;Functional mobility training;Therapeutic activities;Therapeutic exercise;Balance training;Neuromuscular re-education;Patient/family education     PT Goals(Current goals can be found in the care plan section) Acute Rehab PT Goals Patient Stated Goal: Pt didn't state. PT Goal Formulation: With patient Time For Goal Achievement:  09/11/13 Potential to Achieve Goals: Good  Visit Information  Last PT Received On: 08/28/13 Assistance Needed: +2 History of Present Illness: Pt adm with lt CVA and given TPA.       Prior [place] expects to be discharged to:: Private residence Living Arrangements: Spouse/significant other Available Help at Discharge: Family;Neighbor;Available PRN/intermittently Type of Home: House Home Access: Stairs to enter [employer] of Steps: 2 Entrance Stairs-Rails: None Home Layout: One level Home Equipment: Cane - single point;Bedside commode Additional Comments: Per old chart son and daughter live nearby and can offer assistance.  Lives With: Spouse Prior Function Level of Independence: Needs assistance Gait / Transfers Assistance Needed: Amb independently per pt. Communication Communication: HOH Dominant Hand: Right    Cognition  Cognition Arousal/Alertness: Awake/alert Behavior During Therapy: WFL for tasks assessed/performed Overall Cognitive Status: No family/caregiver present to determine baseline cognitive functioning Area of Impairment: Orientation;Problem solving;Following commands Memory: Decreased short-term memory Following Commands: Follows one step commands consistently Problem Solving: Slow processing;Requires verbal cues;Requires tactile cues    Extremity/Trunk Assessment Upper Extremity Assessment Upper Extremity Assessment: Defer to OT evaluation Lower Extremity Assessment Lower Extremity Assessment: RLE deficits/detail RLE Deficits / Details: ankle 0/5, knee/hip 2+/5   Balance Balance Overall balance assessment: Needs assistance Sitting-balance support: Single extremity supported;Feet supported Sitting balance-Leahy Scale: Poor Dynamic Sitting - Comments: Pt sat EOB x 10 minutes with min to mod A.  Had pt prop on lt elbow to attempt to bring him closer to midline. Then able to briefly (10 sec) sit with min  guard Postural control: Posterior lean;Right lateral lean  End of Session PT - End of Session Activity Tolerance: Patient tolerated treatment well Patient left: in bed;with call bell/phone within reach  Nurse Communication: Mobility status;Need for lift equipment  GP     Matthew Hodge 08/28/2013, 3:31 PM  [place]

## 2013-08-28 NOTE — Evaluation (Signed)
Speech Language Pathology Evaluation Patient Details Name: Matthew Hodge MRN: [id number] DOB: [birth year] Today's Date: 08/28/2013 Time: 2725-3664 SLP Time Calculation (min): 28 min  Problem List:  Patient Active Problem List   Diagnosis Date Noted  . Stroke 08/27/2013  . Alzheimer's disease   . Enlarged prostate with urinary retention 07/15/2012  . Chronic constipation 07/15/2012  . Hyperglycemia 07/15/2012  . Acute renal failure 07/15/2012  . Leukocytosis 07/15/2012  . Normocytic anemia 07/15/2012  . UTI (lower urinary tract infection) 07/15/2012   Past Medical History:  Past Medical History  Diagnosis Date  . Alzheimer's disease   . Skin cancer   . Chronic constipation    Past Surgical History:  Past Surgical History  Procedure Laterality Date  . Hand tendon surgery    . Cataract extraction, bilateral    . Skin cancer removal forehead    . Green light laser turp (transurethral resection of prostate  08/11/2012    Procedure: GREEN LIGHT LASER TURP (TRANSURETHRAL RESECTION OF PROSTATE;  Surgeon: Ailene Rud, MD;  Location: [facility];  Service: Urology;  Laterality: N/A;  Julianne Rice Laser of Prostate  . Insertion of suprapubic catheter  08/11/2012    Procedure: INSERTION OF SUPRAPUBIC CATHETER;  Surgeon: Ailene Rud, MD;  Location: [facility];  Service: Urology;  Laterality: N/A;  . Circumcision  08/11/2012    Procedure: CIRCUMCISION ADULT;  Surgeon: Ailene Rud, MD;  Location: [facility];  Service: Urology;  Laterality: N/A;  DORSAL SLIT CIRCUMCISION  . Cystoscopy  08/11/2012    Procedure: CYSTOSCOPY;  Surgeon: Ailene Rud, MD;  Location: [facility];  Service: Urology;  Laterality: N/A;   HPI:  Matthew Hodge is an 80 y.o. male who takes no home medications. Patient was noted to be normal this AM, had breakfast, talkative and moving all extremities. At 10 AM his neighbor (who helps care for him) noted patient walked to the chair and sat down.  HE  had a blank stare on his face and was no communicative.  He was also noted to have right arm and leg weakness. EMS was called and patient was brought as a code stroke.  On arrival patient would answer questions with "yes" otherwise aphasic. He showed right arm and leg hemiparesis. Due to significant aphasia and right hemiparesis tPA was ordered.   Receives assist with all basic ADL's by outside caregiver and receives 24 hour supervision by family members.     Assessment / Plan / Recommendation Clinical Impression  Cognitive Linguistic Evaluation completed per Stroke Protocol.  Receptive and expressive language judged to be baseline with no indication of aphasia.  Moderate to severe deficits in area of problem solving , organization, awareness, sequencing, long-term and short term memory and orientation.  Baseline cognitive deficits evident but now further exacerbated s/p  current episode and unfamiliar environment and routine.  Recommend CIR consult pending results of PT/OT evaluation.  No further Skilled ST warranted in [place] setting as receiving necessary assist and supervision.  If discharged to home recommend f/u ST at next level of care to establish compensatory strategies to increase participation and safety with all basic ADL's  and to provide further education to caregivers.  ST to sign off as education complete.     SLP Assessment  All further Speech Lanaguage Pathology  needs can be addressed in the next venue of care    Follow Up Recommendations  Home health SLP        SLP Evaluation  Prior Functioning  Cognitive/Linguistic Baseline: Baseline deficits Baseline deficit details: Diagnosis of Alzheimers Type of Home: House  Lives With: Spouse Available Help at Discharge: Family;Neighbor;Available PRN/intermittently Vocation: Retired   Associate Professor  Overall Cognitive Status: Impaired/Different from baseline Arousal/Alertness: Awake/alert Orientation Level: Oriented to  person Attention: Focused Focused Attention: Impaired Focused Attention Impairment: Functional basic Memory: Impaired Memory Impairment: Decreased recall of new information;Decreased long term memory;Decreased short term memory Decreased Long Term Memory: Functional basic;Verbal basic Decreased Short Term Memory: Verbal basic;Functional basic Awareness: Impaired Awareness Impairment: Intellectual impairment Problem Solving: Impaired Problem Solving Impairment: Verbal basic;Functional basic Executive Function: Reasoning;Sequencing;Organizing Reasoning: Impaired Reasoning Impairment: Verbal basic;Functional basic Sequencing: Impaired Sequencing Impairment: Verbal basic;Functional basic Organizing: Impaired Organizing Impairment: Verbal basic;Functional basic    Comprehension  Auditory Comprehension Overall Auditory Comprehension: Impaired Yes/No Questions: Impaired Basic Biographical Questions: 0-25% accurate Basic Immediate Environment Questions: 0-24% accurate Commands: Impaired Interfering Components: Hearing;Processing speed;Working Marine scientist;Attention Visual Recognition/Discrimination Discrimination: Not tested Reading Comprehension Reading Status: Not tested    Expression Expression Primary Mode of Expression: Verbal Verbal Expression Overall Verbal Expression: Appears within functional limits for tasks assessed Written Expression Dominant Hand: Right Written Expression: Not tested   Oral / Motor Oral Motor/Sensory Function Overall Oral Motor/Sensory Function: Appears within functional limits for tasks assessed Motor Speech Overall Motor Speech: Appears within functional limits for tasks assessed   Elsa Greasy, [facility] 08/28/2013, 1:45 PM

## 2013-08-28 NOTE — Progress Notes (Signed)
OT Cancellation Note  Patient Details Name: Matthew Hodge MRN: [id number] DOB: 04-Nov-1924   Cancelled Treatment:    Reason Eval/Treat Not Completed: Other (comment) Pt on bed rest. Please update activity orders for OT/.PT to begin. Thank you. [place], OTR/L  [phone] 08/28/2013 08/28/2013, 8:29 AM

## 2013-08-28 NOTE — Progress Notes (Signed)
[facility]  Patient Status: Active (receiving services up to time of hospitalization)  [facility] is providing the following services: RN, PT, OT and HHA  If patient discharges after hours, please call [phone].   Matthew Hodge 08/28/2013, 9:43 AM

## 2013-08-28 NOTE — Progress Notes (Signed)
EEG Completed; Results Pending  

## 2013-08-28 NOTE — Progress Notes (Signed)
PT Cancellation Note  Patient Details Name: CHARLIS HARNER MRN: [id number] DOB: [birth year]   Cancelled Treatment:    Reason Eval/Treat Not Completed: Other (comment) (Pt currently on bedrest.  Will evaluate when activity increased.)   Jisela Merlino 08/28/2013, 8:52 AM

## 2013-08-28 NOTE — Progress Notes (Signed)
Stroke Team Progress Note  HISTORY Matthew Hodge is an 80 y.o. male who takes no home medications. Patient was noted to be normal this AM 08/27/2013, had breakfast, talkative and moving all extremities. At 10 AM his neighbor (who helps care for him) noted patient walked to the chair and sat down. He had a blank stare on his face and was non-communicative. He was also noted to have right arm and leg weakness. EMS was called and patient was brought as a code stroke. On arrival patient would answer questions with "yes" otherwise aphasic. He showed right arm and leg hemiparesis. NIHSS was 13.Due to significant aphasia and right hemiparesis tPA was ordered. Of note: wife states he has had periods in the past where he will stare into space and tremble. These episodes, however, have never presented with prolonged inability to speak or right arm and leg weakness. Patient was administered TPA . He was admitted to the neuro ICU for further evaluation and treatment.  SUBJECTIVE His wife and next door neighbour friend is at the bedside.  Overall he feels his condition is gradually improving. BP well controlled last night. No issues.  OBJECTIVE Most recent Vital Signs: Filed Vitals:   08/28/13 0500 08/28/13 0600 08/28/13 0700 08/28/13 0800  BP: 162/79 132/57 112/56 144/65  Pulse: 96 76 66 77  Temp:   98.2 F (36.8 C)   TempSrc:   Oral   Resp: 16 13 11 14   Height:      Weight:      SpO2: 99% 98% 98% 98%   CBG (last 3)   Recent Labs  08/27/13 1126 08/27/13 2225  GLUCAP 98 102*    IV Fluid Intake:   . sodium chloride 100 mL/hr at 08/27/13 1158  . sodium chloride 75 mL/hr at 08/28/13 0800    MEDICATIONS  . pantoprazole (PROTONIX) IV  40 mg Intravenous QHS   PRN:  acetaminophen, acetaminophen, labetalol, senna-docusate  Diet:  Cardiac thin liquids Activity:  Bedrest DVT Prophylaxis:  SCDs   CLINICALLY SIGNIFICANT STUDIES Basic Metabolic Panel:  Recent Labs Lab 08/27/13 1123  08/27/13 1133  NA 137 137  K 4.7 4.6  CL 98 100  CO2 26  --   GLUCOSE 106* 107*  BUN 19 21  CREATININE 1.09 1.10  CALCIUM 9.1  --    Liver Function Tests:  Recent Labs Lab 08/27/13 1123  AST 18  ALT 10  ALKPHOS 91  BILITOT 0.4  PROT 7.3  ALBUMIN 3.8   CBC:  Recent Labs Lab 08/27/13 1123 08/27/13 1133  WBC 7.6  --   NEUTROABS 5.4  --   HGB 13.8 15.0  HCT 40.6 44.0  MCV 92.9  --   PLT 311  --    Coagulation:  Recent Labs Lab 08/27/13 1125  LABPROT 13.0  INR 1.00   Cardiac Enzymes:  Recent Labs Lab 08/27/13 1124  TROPONINI <0.30   Urinalysis: No results found for this basename: COLORURINE, APPERANCEUR, LABSPEC, [place], GLUCOSEU, HGBUR, BILIRUBINUR, KETONESUR, PROTEINUR, UROBILINOGEN, NITRITE, LEUKOCYTESUR,  in the last 168 hours Lipid Panel No results found for this basename: chol, trig, hdl, cholhdl, vldl, ldlcalc   HgbA1C  No results found for this basename: HGBA1C    Urine Drug Screen:   No results found for this basename: labopia, cocainscrnur, labbenz, amphetmu, thcu, labbarb    Alcohol Level: No results found for this basename: ETH,  in the last 168 hours    CT of the brain  08/27/2013   Progression  of atrophy since 2010.  Marland Kitchen  No acute abnormality.   MRI of the brain  pending  MRA of the brain  pending  2D Echocardiogram  EF 50-55% with no source of embolus.   Carotid Doppler  No evidence of hemodynamically significant internal carotid artery stenosis. Vertebral artery flow is antegrade.   CXR  Not done  EKG  normal sinus rhythm.   Therapy Recommendations   Physical Exam   Frail elderly caucasian male not in distress.Awake alert. Afebrile. Head is nontraumatic. Neck is supple without bruit. Hearing is normal. Cardiac exam no murmur or gallop. Lungs are clear to auscultation. Distal pulses are well felt. Neurological Exam :  Awake alert, oriented to person and time only. Diminished attention, registration and recall. Nonfluent speech  short sentences and monosyllables only. Follows one step commands only. No gaze deviation. Not cooperative for visual fileds test but blinks to threat bilaterally.no face weakness. Tongue midline. Minimal RUE and marked RLE drift present. Mild right grip weak. No sensory loss. Plantars both equivocal. Gait not tested.Marland Kitchen NIHSS 8 ASSESSMENT [name] is a 80 y.o. male presenting with aphasia, right arm and leg hemiplegia. Status post IV t-PA 08/27/2013 at 1150. Imaging confirms  pending. Work up pending  On no antithrombotics per hx, MAR prior to admission. Now on no antithrombotics as within 24h of tpa for secondary stroke prevention. Patient with resultant  Mild right hemiparesis Work up underway.   Alzheimer's disease baseline  Family hx stroke (mother, father)  Hospital day # 1  TREATMENT/PLAN  Add aspirin 325 mg orally every day for secondary stroke prevention if imaging at 24h negative for hemorrhage. First dose after 1150 today but before 12 midnight.  OOB, therapy evals  Add Zocor for elevated LDL  Mobilize out of bed as tolerated  Strict BP control  Check EEG due to staring episodes  D/W patient, wife and neighbour friend  Burnetta Sabin, MSN, RN, ANVP-BC, ANP-BC, GNP-BC [facility] Pager: [phone] 08/28/2013 8:11 AM This patient is critically ill and at significant risk of neurological worsening, death and care requires constant monitoring of vital signs, hemodynamics,respiratory and cardiac monitoring,review of multiple databases, neurological assessment, discussion with family, other specialists and medical decision making of high complexity. I spent 40 minutes of neurocritical care time  in the care of  this patient. I have personally obtained a history, examined the patient, evaluated imaging results, and formulated the assessment and plan of care. I agree with the above. Antony Contras, MD

## 2013-08-29 DIAGNOSIS — G309 Alzheimer's disease, unspecified: Secondary | ICD-10-CM

## 2013-08-29 DIAGNOSIS — F0280 Dementia in other diseases classified elsewhere without behavioral disturbance: Secondary | ICD-10-CM

## 2013-08-29 LAB — GLUCOSE, CAPILLARY: Glucose-Capillary: 101 mg/dL — ABNORMAL HIGH (ref 70–99)

## 2013-08-29 MED ORDER — PANTOPRAZOLE SODIUM 40 MG PO TBEC
40.0000 mg | DELAYED_RELEASE_TABLET | Freq: Every day | ORAL | Status: DC
  Administered 2013-08-29 – 2013-09-01 (×4): 40 mg via ORAL
  Filled 2013-08-29 (×4): qty 1

## 2013-08-29 NOTE — Progress Notes (Signed)
Pt transferred to [address] Bed 14.  Report given to RN.

## 2013-08-29 NOTE — Plan of Care (Signed)
Problem: Acute Treatment Outcomes Goal: Prognosis discussed with family/patient as appropriate Outcome: Progressing Discussed plan for care after discharge: family hoping patient will qualify for inpatient rehab but understand it may be SNF for increased time for strengthening.

## 2013-08-29 NOTE — Procedures (Signed)
ELECTROENCEPHALOGRAM REPORT   Patient: KANNAN PROIA       Room #: 1O10 EEG No. ID: [id number] Age: 80 y.o.        Sex: male Referring Physician: Leonie Man Report Date:  08/29/2013        Interpreting Physician: Anthony Sar  History: DOMONIQUE BROUILLARD is an 80 y.o. male who presented with acute left parietal infarction and received TPA for acute stroke. Patient's spouse and neighbor described recurrent spells of seizure like activity involving extremities with transient shaking. Patient also has chronic memory difficulty.  Indications for study:  Rule out new onset seizure disorder.  Technique: This is an 18 channel routine scalp EEG performed at the bedside with bipolar and monopolar montages arranged in accordance to the international 10/20 system of electrode placement.   Description: This EEG recording was performed during wakefulness. Predominant background activity consisted of 1-2 Hz diffuse low amplitude continuous delta activity with superimposed 7-8 Hz largely rhythmic activity which is most prominent posteriorly. Photic stimulation was not performed. Hyperventilation was not performed. No epileptiform discharges were recorded.  Interpretation: EEG is abnormal with mild diffuse nonspecific continuous slowing of cerebral activity. This pattern of slowing can be seen with a wide variety of encephalopathies, including degenerative as well as metabolic encephalopathies. No evidence of epileptic disorder was demonstrated.   Rush Farmer M.D. [facility] [phone]

## 2013-08-29 NOTE — Progress Notes (Signed)
Stroke Team Progress Note  HISTORY Matthew Hodge is an 80 y.o. male who takes no home medications. Patient was noted to be normal this AM 08/27/2013, had breakfast, talkative and moving all extremities. At 10 AM his neighbor (who helps care for him) noted patient walked to the chair and sat down. He had a blank stare on his face and was non-communicative. He was also noted to have right arm and leg weakness. EMS was called and patient was brought as a code stroke. On arrival patient would answer questions with "yes" otherwise aphasic. He showed right arm and leg hemiparesis. NIHSS was 13.Due to significant aphasia and right hemiparesis tPA was ordered. Of note: wife states he has had periods in the past where he will stare into space and tremble. These episodes, however, have never presented with prolonged inability to speak or right arm and leg weakness. Patient was administered TPA . He was admitted to the neuro ICU for further evaluation and treatment.  SUBJECTIVE His wife , daughter and next door neighbour friend is at the bedside.  Overall he feels his condition is gradually improving. BP well controlled last night. No issues.  OBJECTIVE Most recent Vital Signs: Filed Vitals:   08/29/13 0400 08/29/13 0500 08/29/13 0600 08/29/13 0700  BP: 151/53 150/54 158/61 148/60  Pulse:      Temp: 98.3 F (36.8 C)     TempSrc: Oral     Resp: 15 13 12 11   Height:      Weight:      SpO2: 97%      CBG (last 3)   Recent Labs  08/28/13 1216 08/28/13 1720 08/29/13 0718  GLUCAP 96 88 101*    IV Fluid Intake:   . sodium chloride 100 mL/hr at 08/27/13 1158  . sodium chloride 75 mL/hr at 08/29/13 0600    MEDICATIONS  . aspirin  325 mg Oral Daily  . pantoprazole (PROTONIX) IV  40 mg Intravenous QHS  . simvastatin  20 mg Oral q1800   PRN:  acetaminophen, acetaminophen, labetalol, senna-docusate  Diet:  Cardiac thin liquids Activity:  Bedrest DVT Prophylaxis:  SCDs   CLINICALLY SIGNIFICANT  STUDIES Basic Metabolic Panel:   Recent Labs Lab 08/27/13 1123 08/27/13 1133  NA 137 137  K 4.7 4.6  CL 98 100  CO2 26  --   GLUCOSE 106* 107*  BUN 19 21  CREATININE 1.09 1.10  CALCIUM 9.1  --    Liver Function Tests:   Recent Labs Lab 08/27/13 1123  AST 18  ALT 10  ALKPHOS 91  BILITOT 0.4  PROT 7.3  ALBUMIN 3.8   CBC:   Recent Labs Lab 08/27/13 1123 08/27/13 1133  WBC 7.6  --   NEUTROABS 5.4  --   HGB 13.8 15.0  HCT 40.6 44.0  MCV 92.9  --   PLT 311  --    Coagulation:   Recent Labs Lab 08/27/13 1125  LABPROT 13.0  INR 1.00   Cardiac Enzymes:   Recent Labs Lab 08/27/13 1124  TROPONINI <0.30   Urinalysis: No results found for this basename: COLORURINE, APPERANCEUR, LABSPEC, PHURINE, GLUCOSEU, HGBUR, BILIRUBINUR, KETONESUR, PROTEINUR, UROBILINOGEN, NITRITE, LEUKOCYTESUR,  in the last 168 hours Lipid Panel    Component Value Date/Time   CHOL 181 08/28/2013 0735   HgbA1C  Lab Results  Component Value Date   HGBA1C 6.0* 08/28/2013    Urine Drug Screen:   No results found for this basename: labopia,  cocainscrnur,  labbenz,  amphetmu,  thcu,  labbarb    Alcohol Level: No results found for this basename: ETH,  in the last 168 hours    CT of the brain  08/27/2013   Progression of atrophy since 2010.  Marland Kitchen  No acute abnormality.   MRI of the brain  Scattered small areas of acute infarct in the left parietal lobe.  Possible small area of acute infarct in the right parietal white  matter which shows questionable restricted diffusion.Arachnoid cyst left middle cranial  fossa measuring 29 x 39 mm.   MRA of the brain  Moderately severe intracranial atherosclerotic disease. No large  vessel occlusion.  2D Echocardiogram  EF 50-55% with no source of embolus.   Carotid Doppler  No evidence of hemodynamically significant internal carotid artery stenosis. Vertebral artery flow is antegrade.   CXR  Not done  EKG  normal sinus rhythm.  EEG : moderate  bihemispheric slowing. No seizures  Therapy Recommendations   Physical Exam   Frail elderly caucasian male not in distress.Awake alert. Afebrile. Head is nontraumatic. Neck is supple without bruit. Hearing is normal. Cardiac exam no murmur or gallop. Lungs are clear to auscultation. Distal pulses are well felt. Neurological Exam :  Awake alert, oriented to person and time only. Diminished attention, registration and recall. Nonfluent speech short sentences and monosyllables only. Follows one step commands only. No gaze deviation. Not cooperative for visual fileds test but blinks to threat bilaterally.no face weakness. Decrease hearing bilaterallyTongue midline. Minimal RUE and marked RLE drift present. Mild right grip weak. No sensory loss. Plantars both equivocal. Gait not tested.Marland Kitchen NIHSS 8 ASSESSMENT [name] is a 80 y.o. male presenting with aphasia, right arm and leg hemiplegia. Status post IV t-PA 08/27/2013 at 1150. Imaging confirms   bicerebral embolic infarcts . Patient not a antiocoagulation candidate given dementia and likely fall risk hence will not do TEE.  On no antithrombotics per hx, MAR prior to admission. Now on no antithrombotics as within 24h of tpa for secondary stroke prevention. Patient with resultant  Mild right hemiparesis Work up underway.   Alzheimer's disease baseline  Family hx stroke (mother, father)  Hospital day # 2  TREATMENT/PLAN  Add aspirin 325 mg orally every day for secondary stroke prevention if imaging at 24h negative for hemorrhage. F OOB, therapy evals  Transfer to neuro floor bed  Mobilize out of bed as tolerated  Strict BP control  Therapy and rehab consult     D/W patient, wife, daughter and neighbour friend   Matthew Contras, MD  08/29/2013 9:13 AM

## 2013-08-30 NOTE — Progress Notes (Signed)
Stroke Team Progress Note  HISTORY GABRIELLA GUILE is an 80 y.o. male who takes no home medications. Patient was noted to be normal the morning of 08/27/2013, had breakfast, talkative and moving all extremities. At 10 AM his neighbor (who helps care for him) noted patient walked to the chair and sat down. He had a blank stare on his face and was non-communicative. He was also noted to have right arm and leg weakness. EMS was called and patient was brought as a code stroke. On arrival patient would answer questions with "yes" otherwise aphasic. He showed right arm and leg hemiparesis. NIHSS was 13.Due to significant aphasia and right hemiparesis tPA was ordered. Of note: wife states he has had periods in the past where he will stare into space and tremble. These episodes, however, have never presented with prolonged inability to speak or right arm and leg weakness. Patient was administered TPA . He was admitted to the neuro ICU for further evaluation and treatment.  SUBJECTIVE The patient's wife is at bedside. The patient appears to be somewhat improved.. Awaiting a rehabilitation consult.  OBJECTIVE Most recent Vital Signs: Filed Vitals:   08/29/13 1750 08/29/13 2321 08/30/13 0220 08/30/13 0641  BP: 158/72 150/79 160/87 153/75  Pulse: 106 116 92 75  Temp: 98.5 F (36.9 C) 98.5 F (36.9 C) 98.2 F (36.8 C) 97.8 F (36.6 C)  TempSrc: Oral Oral Oral Oral  Resp: 16  16 16   Height:      Weight:      SpO2: 97% 94% 94% 96%   CBG (last 3)   Recent Labs  08/28/13 1216 08/28/13 1720 08/29/13 0718  GLUCAP 96 88 101*    IV Fluid Intake:   . sodium chloride 75 mL/hr at 08/29/13 2031    MEDICATIONS  . aspirin  325 mg Oral Daily  . pantoprazole  40 mg Oral Daily  . simvastatin  20 mg Oral q1800   PRN:  acetaminophen, acetaminophen, labetalol, senna-docusate  Diet:  Cardiac thin liquids Activity:  Bedrest - will change to up with assistance DVT Prophylaxis:  SCDs   CLINICALLY  SIGNIFICANT STUDIES Basic Metabolic Panel:   Recent Labs Lab 08/27/13 1123 08/27/13 1133  NA 137 137  K 4.7 4.6  CL 98 100  CO2 26  --   GLUCOSE 106* 107*  BUN 19 21  CREATININE 1.09 1.10  CALCIUM 9.1  --    Liver Function Tests:   Recent Labs Lab 08/27/13 1123  AST 18  ALT 10  ALKPHOS 91  BILITOT 0.4  PROT 7.3  ALBUMIN 3.8   CBC:   Recent Labs Lab 08/27/13 1123 08/27/13 1133  WBC 7.6  --   NEUTROABS 5.4  --   HGB 13.8 15.0  HCT 40.6 44.0  MCV 92.9  --   PLT 311  --    Coagulation:   Recent Labs Lab 08/27/13 1125  LABPROT 13.0  INR 1.00   Cardiac Enzymes:   Recent Labs Lab 08/27/13 1124  TROPONINI <0.30   Urinalysis: No results found for this basename: COLORURINE, APPERANCEUR, LABSPEC, PHURINE, GLUCOSEU, HGBUR, BILIRUBINUR, KETONESUR, PROTEINUR, UROBILINOGEN, NITRITE, LEUKOCYTESUR,  in the last 168 hours Lipid Panel    Component Value Date/Time   CHOL 181 08/28/2013 0735   HgbA1C  Lab Results  Component Value Date   HGBA1C 6.0* 08/28/2013    Urine Drug Screen:   No results found for this basename: labopia,  cocainscrnur,  labbenz,  amphetmu,  thcu,  labbarb  Alcohol Level: No results found for this basename: ETH,  in the last 168 hours    CT of the brain  08/27/2013   Progression of atrophy since 2010.  Marland Kitchen  No acute abnormality.   MRI of the brain   Scattered small areas of acute infarct in the left parietal lobe. Possible small area of acute infarct in the right parietal white  matter which shows questionable restricted diffusion.Arachnoid cyst left middle cranial fossa measuring 29 x 39 mm.   MRA of the brain   Moderately severe intracranial atherosclerotic disease. No large vessel occlusion.  2D Echocardiogram  EF 50-55% with no source of embolus.   Carotid Doppler  No evidence of hemodynamically significant internal carotid artery stenosis. Vertebral artery flow is antegrade.   CXR  Not done  EKG  normal sinus rhythm.   EEG :  moderate bihemispheric slowing. No seizures  Therapy Recommendations pending   Physical Exam   Frail elderly caucasian male not in distress.Awake alert. Afebrile. Head is nontraumatic. Neck is supple without bruit. Hearing is normal. Cardiac exam no murmur or gallop. Lungs are clear to auscultation. Distal pulses are well felt. Neurological Exam :  Awake alert, oriented to person and time only. Diminished attention, registration and recall. Nonfluent speech short sentences and monosyllables only. Follows one step commands only. No gaze deviation. Not cooperative for visual fileds test but blinks to threat bilaterally.no face weakness. Decrease hearing bilaterallyTongue midline. Minimal RUE and marked RLE drift present. Mild right grip weak. No sensory loss. Plantars both equivocal. Gait not tested.Marland Kitchen NIHSS 8   ASSESSMENT Mr. MATIN MATTIOLI is a 80 y.o. male presenting with aphasia, right arm and leg hemiplegia. Status post IV t-PA 08/27/2013 at 1150. Imaging confirms   bicerebral embolic infarcts . Patient not a antiocoagulation candidate given dementia and likely fall risk hence will not do TEE.  On no antithrombotics per hx, MAR prior to admission. Now on aspirin 325 mg daily started Friday, 08/28/2012, for secondary stroke prevention. Patient with resultant  Mild right hemiparesis Work up underway.   Alzheimer's disease baseline  Family hx stroke (mother, father)  Hospital day # 3  TREATMENT/PLAN  Aspirin 325 mg daily started 08/28/2013 for secondary stroke prevention if imaging at 24h negative for hemorrhage.   Mobilize out of bed as tolerated  Strict BP control  Therapy and rehab consult     Lowry Ram [facility] Pager [phone] 08/30/2013, 9:55 AM   I have personally examined this patient, reviewed chart and pertinent data, developed plan of care and agree with above Antony Contras, MD

## 2013-08-31 DIAGNOSIS — I6330 Cerebral infarction due to thrombosis of unspecified cerebral artery: Secondary | ICD-10-CM

## 2013-08-31 NOTE — Progress Notes (Signed)
Stroke Team Progress Note  HISTORY Matthew Hodge is an 80 y.o. male who takes no home medications. Patient was noted to be normal this AM 08/27/2013, had breakfast, talkative and moving all extremities. At 10 AM his neighbor (who helps care for him) noted patient walked to the chair and sat down. He had a blank stare on his face and was non-communicative. He was also noted to have right arm and leg weakness. EMS was called and patient was brought as a code stroke. On arrival patient would answer questions with "yes" otherwise aphasic. He showed right arm and leg hemiparesis. NIHSS was 13.Due to significant aphasia and right hemiparesis tPA was ordered. Of note: wife states he has had periods in the past where he will stare into space and tremble. These episodes, however, have never presented with prolonged inability to speak or right arm and leg weakness. Patient was administered TPA . He was admitted to the neuro ICU for further evaluation and treatment.  SUBJECTIVE His wife is at the bedside.    OBJECTIVE Most recent Vital Signs: Filed Vitals:   08/30/13 1826 08/30/13 2212 08/31/13 0111 08/31/13 0530  BP: 161/69 155/74 120/72 179/79  Pulse: 85 71 97 124  Temp: 98.3 F (36.8 C) 98.6 F (37 C) 98.5 F (36.9 C) 97.6 F (36.4 C)  TempSrc: Oral Oral Oral Oral  Resp: 16 18 16 24   Height:      Weight:      SpO2: 97% 97% 95% 94%   CBG (last 3)   Recent Labs  08/28/13 1216 08/28/13 1720 08/29/13 0718  GLUCAP 96 88 101*    IV Fluid Intake:   . sodium chloride 14 mL/hr at 08/30/13 2100    MEDICATIONS  . aspirin  325 mg Oral Daily  . pantoprazole  40 mg Oral Daily  . simvastatin  20 mg Oral q1800   PRN:  acetaminophen, acetaminophen, labetalol, senna-docusate  Diet:  Cardiac thin liquids Activity:  Up with assistance DVT Prophylaxis:  SCDs   CLINICALLY SIGNIFICANT STUDIES Basic Metabolic Panel:   Recent Labs Lab 08/27/13 1123 08/27/13 1133  NA 137 137  K 4.7 4.6  CL  98 100  CO2 26  --   GLUCOSE 106* 107*  BUN 19 21  CREATININE 1.09 1.10  CALCIUM 9.1  --    Liver Function Tests:   Recent Labs Lab 08/27/13 1123  AST 18  ALT 10  ALKPHOS 91  BILITOT 0.4  PROT 7.3  ALBUMIN 3.8   CBC:   Recent Labs Lab 08/27/13 1123 08/27/13 1133  WBC 7.6  --   NEUTROABS 5.4  --   HGB 13.8 15.0  HCT 40.6 44.0  MCV 92.9  --   PLT 311  --    Coagulation:   Recent Labs Lab 08/27/13 1125  LABPROT 13.0  INR 1.00   Cardiac Enzymes:   Recent Labs Lab 08/27/13 1124  TROPONINI <0.30   Urinalysis: No results found for this basename: COLORURINE, APPERANCEUR, LABSPEC, PHURINE, GLUCOSEU, HGBUR, BILIRUBINUR, KETONESUR, PROTEINUR, UROBILINOGEN, NITRITE, LEUKOCYTESUR,  in the last 168 hours  Lipid Panel     Component Value Date/Time   CHOL 181 08/28/2013 0735   TRIG 72 08/28/2013 0735   HDL 41 08/28/2013 0735   CHOLHDL 4.4 08/28/2013 0735   VLDL 14 08/28/2013 0735   LDLCALC 126* 08/28/2013 0735    HgbA1C  Lab Results  Component Value Date   HGBA1C 6.0* 08/28/2013    Urine Drug Screen:  No results found for this basename: labopia,  cocainscrnur,  labbenz,  amphetmu,  thcu,  labbarb    Alcohol Level: No results found for this basename: ETH,  in the last 168 hours    CT of the brain  08/27/2013   Progression of atrophy since 2010.  Marland Kitchen  No acute abnormality.   MRI of the brain  Scattered small areas of acute infarct in the left parietal lobe.  Possible small area of acute infarct in the right parietal white  matter which shows questionable restricted diffusion.Arachnoid cyst left middle cranial  fossa measuring 29 x 39 mm.  MRA of the brain  Moderately severe intracranial atherosclerotic disease. No large  vessel occlusion.  2D Echocardiogram  EF 50-55% with no source of embolus.   Carotid Doppler  No evidence of hemodynamically significant internal carotid artery stenosis. Vertebral artery flow is antegrade.   CXR  Not done  EKG  normal sinus  rhythm.   EEG : moderate bihemispheric slowing. No seizures  Therapy Recommendations   Physical Exam   Frail elderly caucasian male not in distress.Awake alert. Afebrile. Head is nontraumatic. Neck is supple without bruit. Hearing is normal. Cardiac exam no murmur or gallop. Lungs are clear to auscultation. Distal pulses are well felt. Neurological Exam :  Awake alert, oriented to person and time only. Diminished attention, registration and recall. Nonfluent speech short sentences and monosyllables only. Follows one step commands only. No gaze deviation. Not cooperative for visual fileds test but blinks to threat bilaterally.no face weakness. Decrease hearing bilaterallyTongue midline. Minimal RUE and marked RLE drift present. Mild right grip weak. No sensory loss. Plantars both equivocal. Gait not tested.Marland Kitchen NIHSS 8  ASSESSMENT Matthew Hodge is a 80 y.o. male presenting with aphasia, right arm and leg hemiplegia. Status post IV t-PA 08/27/2013 at 1150. Imaging confirms   bicerebral embolic infarcts . Patient not a antiocoagulation candidate given dementia and likely fall risk hence will not do TEE.  On no antithrombotics per hx, MAR prior to admission. Now on aspirin 325 mg orally every day for secondary stroke prevention. Patient with resultant  Mild right hemiparesis Work up completed.   Alzheimer's disease baseline  Family hx stroke (mother, father)  Hospital day # 4  TREATMENT/PLAN  Continue aspirin 325 mg orally every day for secondary stroke prevention   Medically ready for CIR once bed is available  D/W patient, wife, daughter and neighbour friend   Burnetta Sabin, MSN, RN, ANVP-BC, ANP-BC, [facility] Pager: [phone] 08/31/2013 10:49 AM  I have personally obtained a history, examined the patient, evaluated imaging results, and formulated the assessment and plan of care. I agree with the above.  Antony Contras, MD  08/31/2013 10:48 AM

## 2013-08-31 NOTE — Consult Note (Signed)
Physical Medicine and Rehabilitation Consult Reason for Consult: CVA Referring Physician: Dr. Leonie Man   HPI: Matthew Hodge is a 80 y.o. right-handed male was documented history of Alzheimer's disease. Patient lives with his wife and used a cane prior to admission. Patient on no scheduled medications prior to admission. Admitted 08/27/2013 with right side weakness and altered mental status. Cranial CT scan showed no acute abnormalities. Patient did receive TPA. MRI of the brain showed small patchy areas of acute infarct in the left parietal lobe. Possible 5 mm acute infarct right parietal white matter. MRA of the head with no large vessel occlusion. Echocardiogram with ejection fraction of 55% and normal systolic function. Carotid Dopplers with no ICA stenosis. EEG negative for seizure. Neurology services consulted maintained on aspirin for CVA prophylaxis. Patient is tolerating a regular diet. Physical therapy evaluation completed 08/28/2013 with recommendations for physical medicine rehabilitation consult to consider inpatient rehabilitation services  Review of Systems  Psychiatric/Behavioral: Positive for memory loss.  All other systems reviewed and are negative.   Past Medical History  Diagnosis Date  . Alzheimer's disease   . Skin cancer   . Chronic constipation    Past Surgical History  Procedure Laterality Date  . Hand tendon surgery    . Cataract extraction, bilateral    . Skin cancer removal forehead    . Green light laser turp (transurethral resection of prostate  08/11/2012    Procedure: GREEN LIGHT LASER TURP (TRANSURETHRAL RESECTION OF PROSTATE;  Surgeon: Ailene Rud, MD;  Location: [facility];  Service: Urology;  Laterality: N/A;  Julianne Rice Laser of Prostate  . Insertion of suprapubic catheter  08/11/2012    Procedure: INSERTION OF SUPRAPUBIC CATHETER;  Surgeon: Ailene Rud, MD;  Location: [facility];  Service: Urology;  Laterality: N/A;  . Circumcision   08/11/2012    Procedure: CIRCUMCISION ADULT;  Surgeon: Ailene Rud, MD;  Location: [facility];  Service: Urology;  Laterality: N/A;  DORSAL SLIT CIRCUMCISION  . Cystoscopy  08/11/2012    Procedure: CYSTOSCOPY;  Surgeon: Ailene Rud, MD;  Location: [facility];  Service: Urology;  Laterality: N/A;   Family History  Problem Relation Age of Onset  . Stroke Mother   . Stroke Father   . COPD Brother   . Heart disease Brother   . Diabetes Brother    Social History:  reports that he has never smoked. He has never used smokeless tobacco. He reports that he does not drink alcohol or use illicit drugs. Allergies: No Known Allergies No prescriptions prior to admission    Home: Home Living Family/patient expects to be discharged to:: Private residence Living Arrangements: Spouse/significant other Available Help at Discharge: Family;Neighbor;Available PRN/intermittently Type of Home: House Home Access: Stairs to enter [employer] of Steps: 2 Entrance Stairs-Rails: None Home Layout: One level Home Equipment: Cane - single point;Bedside commode Additional Comments: Per old chart son and daughter live nearby and can offer assistance.  Lives With: Spouse  Functional History: Prior Function Vocation: Retired Functional Status:  Mobility:          ADL:    Cognition: Cognition Overall Cognitive Status: No family/caregiver present to determine baseline cognitive functioning Arousal/Alertness: Awake/alert Orientation Level: Oriented to person;Oriented to place Attention: Focused Focused Attention: Impaired Focused Attention Impairment: Functional basic Memory: Impaired Memory Impairment: Decreased recall of new information;Decreased long term memory;Decreased short term memory Decreased Long Term Memory: Functional basic;Verbal basic Decreased Short Term Memory: Verbal basic;Functional basic Awareness: Impaired Awareness Impairment: Intellectual  impairment Problem Solving: Impaired Problem Solving Impairment: Verbal basic;Functional basic Executive Function: Reasoning;Sequencing;Organizing Reasoning: Impaired Reasoning Impairment: Verbal basic;Functional basic Sequencing: Impaired Sequencing Impairment: Verbal basic;Functional basic Organizing: Impaired Organizing Impairment: Verbal basic;Functional basic Cognition Arousal/Alertness: Awake/alert Behavior During Therapy: WFL for tasks assessed/performed Overall Cognitive Status: No family/caregiver present to determine baseline cognitive functioning Area of Impairment: Orientation;Problem solving;Following commands Memory: Decreased short-term memory Following Commands: Follows one step commands consistently Problem Solving: Slow processing;Requires verbal cues;Requires tactile cues  Blood pressure 179/79, pulse 124, temperature 97.6 F (36.4 C), temperature source Oral, resp. rate 24, height 6' (1.829 m), weight 82.4 kg (181 lb 10.5 oz), SpO2 94.00%. Physical Exam  Vitals reviewed. HENT:  Head: Normocephalic.  Eyes: EOM are normal.  Neck: Normal range of motion. Neck supple. No thyromegaly present.  Cardiovascular: Normal rate and regular rhythm.   Respiratory: Effort normal and breath sounds normal. No respiratory distress.  GI: Bowel sounds are normal. He exhibits no distension.  Neurological:  Patient was arousable and made eye contact with examiner. He was nonverbal except would answer yes no questions but inconsistent. He was not able to provide his name and was inconsistent in follow commands. Insight and awareness is limited. Was able to id simple objects and reada a short sentence. Told me who is wife was. RUE is 1 to 1+ and inconsistent. RLE is similar and 1 to 1+ prox to 1- distally at the ankle. Senses pain. No resting tone.   Skin: Skin is warm and dry.  Psychiatric:  Flat, fatigued    No results found for this or any previous visit (from the past 24  hour(s)). No results found.  Assessment/Plan: Diagnosis: Right MCA infarct 1. Does the need for close, 24 hr/day medical supervision in concert with the patient's rehab needs make it unreasonable for this patient to be served in a less intensive setting? Yes 2. Co-Morbidities requiring supervision/potential complications: BPH, acute renal failure, hx of dementia 3. Due to bladder management, bowel management, safety, skin/wound care, disease management, medication administration, pain management and patient education, does the patient require 24 hr/day rehab nursing? Yes 4. Does the patient require coordinated care of a physician, rehab nurse, PT (1-2 hrs/day, 5 days/week), OT (1-2 hrs/day, 5 days/week) and SLP (1-2 hrs/day, 5 days/week) to address physical and functional deficits in the context of the above medical diagnosis(es)? Yes Addressing deficits in the following areas: balance, endurance, locomotion, strength, transferring, bowel/bladder control, bathing, dressing, feeding, grooming, toileting, cognition, speech, language, swallowing and psychosocial support 5. Can the patient actively participate in an intensive therapy program of at least 3 hrs of therapy per day at least 5 days per week? Yes 6. The potential for patient to make measurable gains while on inpatient rehab is good 7. Anticipated functional outcomes upon discharge from inpatient rehab are min assist with PT, min to mod assist with OT, min assist with SLP. 8. Estimated rehab length of stay to reach the above functional goals is: 20-27 days 9. Does the patient have adequate social supports to accommodate these discharge functional goals? Yes 10. Anticipated D/C setting: Home 11. Anticipated post D/C treatments: [facility] therapy 12. Overall Rehab/Functional Prognosis: excellent  RECOMMENDATIONS: This patient's condition is appropriate for continued rehabilitative care in the following setting: CIR Patient has agreed to participate  in recommended program. Yes Note that insurance prior authorization may be required for reimbursement for recommended care.  Comment: Rehab Admissions Coordinator to follow up.  Family supportive and known to me.  Thanks,  Alroy Dust  Alen Blew, MD, Mell[facility] Drown     08/31/2013

## 2013-08-31 NOTE — Evaluation (Addendum)
Occupational Therapy Evaluation Patient Details Name: Matthew Hodge MRN: [id number] DOB: 06-Dec-1924 Today's Date: 08/31/2013 Time: 5102-5852 OT Time Calculation (min): 34 min  OT Assessment / Plan / Recommendation History of present illness Pt adm with lt CVA and given TPA.   Clinical Impression   Pt presents with below problem list. Pt requiring Min A for ADLs, PTA. Feel pt would benefit from acute OT to increase independence. Recommending CIR for additional rehab.    OT Assessment  Patient needs continued OT Services    Follow Up Recommendations  CIR    Barriers to Discharge      Equipment Recommendations  Other (comment) (defer to next venue)    Recommendations for Other Services    Frequency  Min 2X/week    Precautions / Restrictions Precautions Precautions: Fall Restrictions Weight Bearing Restrictions: No   Pertinent Vitals/Pain No pain reported.     ADL  Eating/Feeding: Minimal assistance (assisted in positioning arm-assistance given at arm at times) Where Assessed - Eating/Feeding: Chair Grooming: Set up Where Assessed - Grooming: Supported sitting Lower Body Dressing: +2 Total assistance Lower Body Dressing: Patient Percentage: 10% Where Assessed - Lower Body Dressing: Supported sit to stand Toilet Transfer: +2 Total assistance Toilet Transfer: Patient Percentage: 30% Toilet Transfer Method: Sit to Loss adjuster, chartered: Other (comment) (recliner chair) Tub/Shower Transfer Method: Not assessed Equipment Used: Gait belt Transfers/Ambulation Related to ADLs: +2 Total A ADL Comments: Educated on dressing technique. Told family/caregiver to have RUE supported on pillow. OT assisted in donning/doffing socks. Educated caregiver on how to assist with feeding.    OT Diagnosis: Hemiplegia dominant side;Cognitive deficits  OT Problem List: Decreased strength;Decreased activity tolerance;Decreased cognition;Impaired balance (sitting and/or  standing);Decreased coordination;Decreased knowledge of use of DME or AE;Decreased knowledge of precautions;Impaired UE functional use OT Treatment Interventions: Self-care/ADL training;Therapeutic exercise;Neuromuscular education;DME and/or AE instruction;Therapeutic activities;Cognitive remediation/compensation;Patient/family education;Balance training   OT Goals(Current goals can be found in the care plan section) Acute Rehab OT Goals Patient Stated Goal: did not state OT Goal Formulation: With patient/family Time For Goal Achievement: 09/07/13 Potential to Achieve Goals: Good ADL Goals Pt Will Perform Eating: with modified independence;sitting Pt Will Perform Grooming: with modified independence;sitting Pt Will Perform Lower Body Dressing: with min assist;sitting/lateral leans Pt Will Transfer to Toilet: with +2 assist;with mod assist;stand pivot transfer;bedside commode Pt Will Perform Toileting - Clothing Manipulation and hygiene: with mod assist;sitting/lateral leans  Visit Information  Last OT Received On: 08/31/13 Assistance Needed: +2 History of Present Illness: Pt adm with lt CVA and given TPA.       Prior [place] expects to be discharged to:: Private residence Living Arrangements: Spouse/significant other Available Help at Discharge: Family;Neighbor;Available PRN/intermittently Type of Home: House Home Access: Stairs to enter [employer] of Steps: 2 Entrance Stairs-Rails: None Home Layout: One level Home Equipment: Cane - single point;Bedside commode;Grab bars - tub/shower;Shower seat  Lives With: Spouse Prior Function Level of Independence: Needs assistance Gait / Transfers Assistance Needed: Amb independently per pt. ADL's / Homemaking Assistance Needed: assistance for shower transfer; Min A for LB dressing. Communication Communication: HOH Dominant Hand: Right         Vision/Perception Vision -  History Baseline Vision: Wears glasses only for reading Patient Visual Report: No change from baseline   Cognition  Cognition Arousal/Alertness: Awake/alert Behavior During Therapy: WFL for tasks assessed/performed Overall Cognitive Status: Impaired/Different from baseline Area of Impairment: Orientation;Problem solving; Memory Orientation Level: Disoriented to;Place;Time Memory: Decreased short-term  memory Problem Solving: Slow processing;Requires verbal cues;Requires tactile cues;Difficulty sequencing    Extremity/Trunk Assessment Upper Extremity Assessment Upper Extremity Assessment: RUE deficits/detail RUE Deficits / Details: decreased grip strength; Brunnstrom stage V for hand (relative independence from synergy) and Stage VI (isolated joint movement)  for right arm. RUE Coordination: decreased gross motor;decreased fine motor     Mobility  Transfers Overall transfer level: Needs assistance Transfers: Sit to/from Stand Sit to Stand: +2 physical assistance;Max assist Stand pivot transfers: Max assist;+2 physical assistance General transfer comment: cues/assistance for technique. stood 2x and did not achieve full upright position.     Exercise     Balance     End of Session OT - End of Session Equipment Utilized During Treatment: Gait belt Activity Tolerance: Patient tolerated treatment well Patient left: in chair;with call bell/phone within reach;with chair alarm set;with family/visitor present Nurse Communication: Other (comment) (assist with feeding)  GO     Benito Mccreedy OTR/L [phone] 08/31/2013, 3:49 PM

## 2013-08-31 NOTE — Progress Notes (Signed)
UR complete.  Addisson Frate RN, MSN 

## 2013-08-31 NOTE — Progress Notes (Signed)
Physical Therapy Treatment Patient Details Name: Matthew Hodge MRN: [id number] DOB: [birth year] Today's Date: 08/31/2013 Time: 1140-1200 PT Time Calculation (min): 20 min  PT Assessment / Plan / Recommendation  History of Present Illness Pt adm with lt CVA and given TPA.   PT Comments   Pt very motivated and attempts to follow all directions.  Pt fatigues with mobility.  Will continue to follow.    Follow Up Recommendations  CIR     Does the patient have the potential to tolerate intense rehabilitation     Barriers to Discharge        Equipment Recommendations  None recommended by PT    Recommendations for Other Services    Frequency Min 4X/week   Progress towards PT Goals Progress towards PT goals: Progressing toward goals  Plan Current plan remains appropriate    Precautions / Restrictions Precautions Precautions: Fall Restrictions Weight Bearing Restrictions: No   Pertinent Vitals/Pain Denied pain    Mobility  Bed Mobility Overal bed mobility: Needs Assistance Bed Mobility: Supine to Sit Supine to sit: Max assist;+2 for physical assistance General bed mobility comments: pt needs cues for safe technique and attending to task.   Transfers Overall transfer level: Needs assistance Equipment used: 2 person hand held assist Transfers: Sit to/from [facility] Sit to Stand: Max assist;+2 physical assistance Stand pivot transfers: Max assist;+2 physical assistance General transfer comment: cues for use of UE, maintaining balance over BOS, movement of LEs trough SPT.   Modified Rankin (Stroke Patients Only) Pre-Morbid Rankin Score: Moderate disability Modified Rankin: Severe disability    Exercises     PT Diagnosis:    PT Problem List:   PT Treatment Interventions:     PT Goals (current goals can now be found in the care plan section) Acute Rehab PT Goals Patient Stated Goal: did not state Time For Goal Achievement: 09/11/13 Potential to  Achieve Goals: Good  Visit Information  Last PT Received On: 08/31/13 Assistance Needed: +2 History of Present Illness: Pt adm with lt CVA and given TPA.    Subjective Data  Patient Stated Goal: did not state   Cognition  Cognition Arousal/Alertness: Awake/alert Behavior During Therapy: WFL for tasks assessed/performed Overall Cognitive Status: Impaired/Different from baseline Area of Impairment: Orientation;Memory;Problem solving;Safety/judgement;Awareness Orientation Level: Disoriented to;Place;Time Memory: Decreased short-term memory Following Commands: Follows one step commands consistently Safety/Judgement: Decreased awareness of safety;Decreased awareness of deficits Awareness: Emergent;Anticipatory Problem Solving: Slow processing;Difficulty sequencing;Requires verbal cues;Requires tactile cues    Balance     End of Session PT - End of Session Equipment Utilized During Treatment: Gait belt Activity Tolerance: Patient tolerated treatment well Patient left: in chair;with call bell/phone within reach;with family/visitor present Nurse Communication: Mobility status;Need for lift equipment   GP     Matthew Hodge, [place] 08/31/2013, 2:05 PM

## 2013-09-01 ENCOUNTER — Inpatient Hospital Stay (HOSPITAL_COMMUNITY)
Admission: RE | Admit: 2013-09-01 | Discharge: 2013-09-26 | DRG: 945 | Disposition: A | Discharge status: Home / Self Care | Payer: Medicare Other | Source: Intra-hospital | Attending: Physical Medicine & Rehabilitation | Admitting: Physical Medicine & Rehabilitation

## 2013-09-01 DIAGNOSIS — R29898 Other symptoms and signs involving the musculoskeletal system: Secondary | ICD-10-CM | POA: Diagnosis present

## 2013-09-01 DIAGNOSIS — R339 Retention of urine, unspecified: Secondary | ICD-10-CM

## 2013-09-01 DIAGNOSIS — D649 Anemia, unspecified: Secondary | ICD-10-CM

## 2013-09-01 DIAGNOSIS — Z79899 Other long term (current) drug therapy: Secondary | ICD-10-CM

## 2013-09-01 DIAGNOSIS — F0390 Unspecified dementia without behavioral disturbance: Secondary | ICD-10-CM

## 2013-09-01 DIAGNOSIS — K5909 Other constipation: Secondary | ICD-10-CM

## 2013-09-01 DIAGNOSIS — Z833 Family history of diabetes mellitus: Secondary | ICD-10-CM

## 2013-09-01 DIAGNOSIS — Z8249 Family history of ischemic heart disease and other diseases of the circulatory system: Secondary | ICD-10-CM

## 2013-09-01 DIAGNOSIS — G309 Alzheimer's disease, unspecified: Secondary | ICD-10-CM | POA: Diagnosis present

## 2013-09-01 DIAGNOSIS — Z85828 Personal history of other malignant neoplasm of skin: Secondary | ICD-10-CM

## 2013-09-01 DIAGNOSIS — Z9849 Cataract extraction status, unspecified eye: Secondary | ICD-10-CM

## 2013-09-01 DIAGNOSIS — I6330 Cerebral infarction due to thrombosis of unspecified cerebral artery: Secondary | ICD-10-CM | POA: Diagnosis present

## 2013-09-01 DIAGNOSIS — Z5189 Encounter for other specified aftercare: Principal | ICD-10-CM

## 2013-09-01 DIAGNOSIS — I6350 Cerebral infarction due to unspecified occlusion or stenosis of unspecified cerebral artery: Secondary | ICD-10-CM

## 2013-09-01 DIAGNOSIS — I639 Cerebral infarction, unspecified: Secondary | ICD-10-CM | POA: Diagnosis present

## 2013-09-01 DIAGNOSIS — Z823 Family history of stroke: Secondary | ICD-10-CM

## 2013-09-01 DIAGNOSIS — Z7982 Long term (current) use of aspirin: Secondary | ICD-10-CM

## 2013-09-01 DIAGNOSIS — F0280 Dementia in other diseases classified elsewhere without behavioral disturbance: Secondary | ICD-10-CM | POA: Diagnosis present

## 2013-09-01 DIAGNOSIS — N390 Urinary tract infection, site not specified: Secondary | ICD-10-CM | POA: Diagnosis not present

## 2013-09-01 DIAGNOSIS — K5900 Constipation, unspecified: Secondary | ICD-10-CM | POA: Diagnosis present

## 2013-09-01 MED ORDER — ONDANSETRON HCL 4 MG/2ML IJ SOLN: 4.0000 mg | Freq: Four times a day (QID) | INTRAMUSCULAR | Status: DC | PRN

## 2013-09-01 MED ORDER — ASPIRIN 325 MG PO TABS
325.0000 mg | ORAL_TABLET | Freq: Every day | ORAL | Status: DC
  Administered 2013-09-02 – 2013-09-14 (×13): 325 mg via ORAL
  Filled 2013-09-01 (×15): qty 1

## 2013-09-01 MED ORDER — SORBITOL 70 % SOLN
30.0000 mL | Freq: Every day | Status: DC | PRN
  Administered 2013-09-09 – 2013-09-17 (×2): 30 mL via ORAL
  Filled 2013-09-01 (×4): qty 30

## 2013-09-01 MED ORDER — PANTOPRAZOLE SODIUM 40 MG PO TBEC
40.0000 mg | DELAYED_RELEASE_TABLET | Freq: Every day | ORAL | Status: DC
  Administered 2013-09-02 – 2013-09-26 (×25): 40 mg via ORAL
  Filled 2013-09-01 (×26): qty 1

## 2013-09-01 MED ORDER — ONDANSETRON HCL 4 MG PO TABS
4.0000 mg | ORAL_TABLET | Freq: Four times a day (QID) | ORAL | Status: DC | PRN
  Administered 2013-09-17 – 2013-09-24 (×2): 4 mg via ORAL
  Filled 2013-09-01 (×2): qty 1

## 2013-09-01 MED ORDER — SENNOSIDES-DOCUSATE SODIUM 8.6-50 MG PO TABS
1.0000 | ORAL_TABLET | Freq: Every day | ORAL | Status: DC
  Administered 2013-09-02 – 2013-09-19 (×18): 1 via ORAL
  Filled 2013-09-01 (×21): qty 1

## 2013-09-01 MED ORDER — ACETAMINOPHEN 325 MG PO TABS
650.0000 mg | ORAL_TABLET | ORAL | Status: DC | PRN
  Administered 2013-09-04: 650 mg via ORAL
  Filled 2013-09-01 (×2): qty 2

## 2013-09-01 MED ORDER — SENNOSIDES-DOCUSATE SODIUM 8.6-50 MG PO TABS: 1.0000 | ORAL_TABLET | Freq: Every morning | ORAL | Status: DC

## 2013-09-01 MED ORDER — ACETAMINOPHEN 650 MG RE SUPP: 650.0000 mg | RECTAL | Status: DC | PRN

## 2013-09-01 MED ORDER — SIMVASTATIN 20 MG PO TABS
20.0000 mg | ORAL_TABLET | Freq: Every day | ORAL | Status: DC
  Administered 2013-09-01 – 2013-09-25 (×26): 20 mg via ORAL
  Filled 2013-09-01 (×26): qty 1

## 2013-09-01 NOTE — Progress Notes (Signed)
Stroke Team Progress Note  HISTORY Matthew Hodge is an 80 y.o. male who takes no home medications. Patient was noted to be normal this AM 08/27/2013, had breakfast, talkative and moving all extremities. At 10 AM his neighbor (who helps care for him) noted patient walked to the chair and sat down. He had a blank stare on his face and was non-communicative. He was also noted to have right arm and leg weakness. EMS was called and patient was brought as a code stroke. On arrival patient would answer questions with "yes" otherwise aphasic. He showed right arm and leg hemiparesis. NIHSS was 13.Due to significant aphasia and right hemiparesis tPA was ordered. Of note: wife states he has had periods in the past where he will stare into space and tremble. These episodes, however, have never presented with prolonged inability to speak or right arm and leg weakness. Patient was administered TPA . He was admitted to the neuro ICU for further evaluation and treatment.  SUBJECTIVE His wife and son are at the bedside.  PT has been by this am per them. They are waiting on IP rehab to come.  OBJECTIVE Most recent Vital Signs: Filed Vitals:   08/31/13 1703 08/31/13 2015 09/01/13 0205 09/01/13 0522  BP: 127/72 106/64 138/69 135/83  Pulse: 81 76 81 87  Temp: 97.6 F (36.4 C) 98.5 F (36.9 C) 98.3 F (36.8 C) 98.1 F (36.7 C)  TempSrc: Oral Oral Oral Oral  Resp: 18 18 18 18   Height:      Weight:      SpO2: 96% 97% 95% 94%   CBG (last 3)  No results found for this basename: GLUCAP,  in the last 72 hours  IV Fluid Intake:   . sodium chloride 14 mL/hr at 09/01/13 0517    MEDICATIONS  . aspirin  325 mg Oral Daily  . pantoprazole  40 mg Oral Daily  . simvastatin  20 mg Oral q1800   PRN:  acetaminophen, acetaminophen, labetalol, senna-docusate  Diet:  Cardiac thin liquids Activity:  Up with assistance DVT Prophylaxis:  SCDs   CLINICALLY SIGNIFICANT STUDIES Basic Metabolic Panel:   Recent Labs Lab  08/27/13 1123 08/27/13 1133  NA 137 137  K 4.7 4.6  CL 98 100  CO2 26  --   GLUCOSE 106* 107*  BUN 19 21  CREATININE 1.09 1.10  CALCIUM 9.1  --    Liver Function Tests:   Recent Labs Lab 08/27/13 1123  AST 18  ALT 10  ALKPHOS 91  BILITOT 0.4  PROT 7.3  ALBUMIN 3.8   CBC:   Recent Labs Lab 08/27/13 1123 08/27/13 1133  WBC 7.6  --   NEUTROABS 5.4  --   HGB 13.8 15.0  HCT 40.6 44.0  MCV 92.9  --   PLT 311  --    Coagulation:   Recent Labs Lab 08/27/13 1125  LABPROT 13.0  INR 1.00   Cardiac Enzymes:   Recent Labs Lab 08/27/13 1124  TROPONINI <0.30   Urinalysis: No results found for this basename: COLORURINE, APPERANCEUR, LABSPEC, PHURINE, GLUCOSEU, HGBUR, BILIRUBINUR, KETONESUR, PROTEINUR, UROBILINOGEN, NITRITE, LEUKOCYTESUR,  in the last 168 hours  Lipid Panel     Component Value Date/Time   CHOL 181 08/28/2013 0735   TRIG 72 08/28/2013 0735   HDL 41 08/28/2013 0735   CHOLHDL 4.4 08/28/2013 0735   VLDL 14 08/28/2013 0735   LDLCALC 126* 08/28/2013 0735    HgbA1C  Lab Results  Component Value Date  HGBA1C 6.0* 08/28/2013    Urine Drug Screen:   No results found for this basename: labopia,  cocainscrnur,  labbenz,  amphetmu,  thcu,  labbarb    Alcohol Level: No results found for this basename: ETH,  in the last 168 hours    CT of the brain  08/27/2013   Progression of atrophy since 2010.  Marland Kitchen  No acute abnormality.   MRI of the brain  Scattered small areas of acute infarct in the left parietal lobe.  Possible small area of acute infarct in the right parietal white  matter which shows questionable restricted diffusion.Arachnoid cyst left middle cranial  fossa measuring 29 x 39 mm.  MRA of the brain  Moderately severe intracranial atherosclerotic disease. No large  vessel occlusion.  2D Echocardiogram  EF 50-55% with no source of embolus.   Carotid Doppler  No evidence of hemodynamically significant internal carotid artery stenosis. Vertebral artery  flow is antegrade.   CXR  Not done  EKG  normal sinus rhythm.   EEG : moderate bihemispheric slowing. No seizures  Therapy Recommendations CIR  Physical Exam   Frail elderly caucasian male not in distress.Awake alert. Afebrile. Head is nontraumatic. Neck is supple without bruit. Hearing is normal. Cardiac exam no murmur or gallop. Lungs are clear to auscultation. Distal pulses are well felt. Neurological Exam :  Awake alert, oriented to person and time only. Diminished attention, registration and recall. Nonfluent speech short sentences and monosyllables only. Follows one step commands only. No gaze deviation. Not cooperative for visual fileds test but blinks to threat bilaterally.no face weakness. Decrease hearing bilaterallyTongue midline. Minimal RUE and marked RLE drift present. Mild right grip weak. No sensory loss. Plantars both equivocal. Gait not tested.Marland Kitchen NIHSS 8  ASSESSMENT [name] is a 80 y.o. male presenting with aphasia, right arm and leg hemiplegia. Status post IV t-PA 08/27/2013 at 1150. Imaging confirms   bicerebral embolic infarcts . Patient not a antiocoagulation candidate given dementia and likely fall risk hence will not do TEE.  On no antithrombotics per hx, MAR prior to admission. Now on aspirin 325 mg orally every day for secondary stroke prevention. Patient with resultant  Mild right hemiparesis Work up completed.   Alzheimer's disease baseline  Family hx stroke (mother, father)  Hospital day # 5  TREATMENT/PLAN  Continue aspirin 325 mg orally every day for secondary stroke prevention   Medically ready for CIR once bed is available. Hope for transfer there today.  D/W patient, wife, daughter and neighbour friend   Burnetta Sabin, MSN, RN, ANVP-BC, ANP-BC, [facility] Pager: [phone] 09/01/2013 10:45 AM  I have personally obtained a history, examined the patient, evaluated imaging results, and formulated the assessment and  plan of care. I agree with the above.   Antony Contras, MD  09/01/2013 10:45 AM

## 2013-09-01 NOTE — Interval H&P Note (Signed)
Matthew Hodge was admitted today to Inpatient Rehabilitation with the diagnosis of right MCA infarct.  The patient's history has been reviewed, patient examined, and there is no change in status.  Patient continues to be appropriate for intensive inpatient rehabilitation.  I have reviewed the patient's chart and labs.  Questions were answered to the patient's satisfaction.  SWARTZ,ZACHARY T 09/01/2013, 9:19 PM

## 2013-09-01 NOTE — PMR Pre-admission (Signed)
PMR Admission Coordinator Pre-Admission Assessment  Patient: Matthew Hodge is an 80 y.o., male MRN: [id number] DOB: Nov 16, 1924 Height: 6' (182.9 cm) Weight: 82.4 kg (181 lb 10.5 oz)              Insurance Information HMO:     PPO:      PCP:      IPA:      80/20: yes     OTHER: no HMO PRIMARY: Medicare a and b      Policy#: [id number]      Subscriber: pt Benefits:  Phone #: online     Name: 08/31/13 Eff. Date: 12/19/91     Deduct: $1260      Out of Pocket Max: none      Life Max: none CIR: 100%      SNF: 20 full days Outpatient: 80%     Co-Pay: 20% Home Health: 100%      Co-Pay: none DME: 80%     Co-Pay: 20% Providers: pt choice  SECONDARY: [employer] supplement      Policy#: [id number]      Subscriber: pt No auth needed  Emergency Contact Information Contact Information   Name Relation Home Work Mobile   [place] Son [phone]  [phone]   [facility] Daughter [phone]     [place] Spouse [phone]       Current Medical History  Patient Admitting Diagnosis: right MCA infarct  History of Present Illness: Matthew Hodge is a 80 y.o. right-handed male was documented history of Alzheimer's disease. Patient lives with his wife and used a cane prior to admission. Patient on no scheduled medications prior to admission. Admitted 08/27/2013 with right side weakness and altered mental status. Cranial CT scan showed no acute abnormalities. Patient did receive TPA. MRI of the brain showed small patchy areas of acute infarct in the left parietal lobe. Possible 5 mm acute infarct right parietal white matter. MRA of the head with no large vessel occlusion. Echocardiogram with ejection fraction of 55% and normal systolic function.  Carotid Dopplers with no ICA stenosis. EEG negative for seizure. Neurology services consulted maintained on aspirin for CVA prophylaxis. Patient is tolerating a regular diet.   Total: 4 NIH    Past Medical History  Past Medical History   Diagnosis Date  . Alzheimer's disease   . Skin cancer   . Chronic constipation     Family History  family history includes COPD in his brother; Diabetes in his brother; Heart disease in his brother; Stroke in his father and mother.  Prior Rehab/Hospitalizations: none   Current Medications  Current facility-administered medications:0.9 %  sodium chloride infusion, , Intravenous, Continuous, Garvin Fila, MD, Last Rate: 14 mL/hr at 09/01/13 0517;  acetaminophen (TYLENOL) suppository 650 mg, 650 mg, Rectal, Q4H PRN, Marliss Coots, PA-C;  acetaminophen (TYLENOL) tablet 650 mg, 650 mg, Oral, Q4H PRN, Marliss Coots, PA-C;  aspirin tablet 325 mg, 325 mg, Oral, Daily, Garvin Fila, MD, 325 mg at 09/01/13 0944 labetalol (NORMODYNE,TRANDATE) injection 10 mg, 10 mg, Intravenous, Q10 min PRN, Marliss Coots, PA-C, 10 mg at 08/27/13 1506;  pantoprazole (PROTONIX) EC tablet 40 mg, 40 mg, Oral, Daily, David L Rinehuls, PA-C, 40 mg at 09/01/13 0945;  [START ON 09/02/2013] senna-docusate (Senokot-S) tablet 1 tablet, 1 tablet, Oral, q morning - 10a, Garvin Fila, MD;  simvastatin (ZOCOR) tablet 20 mg, 20 mg, Oral, q1800, Garvin Fila, MD, 20 mg at 08/31/13 1721  Patients Current Diet: Cardiac  Precautions / Restrictions Precautions Precautions: Fall Restrictions Weight Bearing Restrictions: No   Prior Activity Level Community (5-7x/wk): community with caregiver/neighbor 3 to 4 times per week; 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 ADL's / Homemaking Assistance Needed: assistance for shower transfer; Min A for LB  dressing.  Current Functional Level Cognition  Arousal/Alertness: Awake/alert Overall Cognitive Status: Impaired/Different from baseline Orientation Level: Oriented to person;Oriented to place Following Commands: Follows one step commands consistently Safety/Judgement: Decreased awareness of safety;Decreased awareness of deficits Attention: Focused Focused Attention: Impaired Focused Attention Impairment: Functional basic Memory: Impaired Memory Impairment: Decreased recall of new information;Decreased long term memory;Decreased short term memory Decreased Long Term Memory: Functional basic;Verbal basic Decreased Short Term Memory: Verbal basic;Functional basic Awareness: Impaired Awareness Impairment: Intellectual impairment Problem Solving: Impaired Problem Solving Impairment: Verbal basic;Functional basic Executive Function: Reasoning;Sequencing;Organizing Reasoning: Impaired Reasoning Impairment: Verbal basic;Functional basic Sequencing: Impaired Sequencing Impairment: Verbal basic;Functional basic Organizing: Impaired Organizing Impairment: Verbal basic;Functional basic    Extremity Assessment (includes Sensation/Coordination)          ADLs  Eating/Feeding: Minimal assistance Where Assessed - Eating/Feeding: Chair Grooming: Set up Where Assessed - Grooming: Supported sitting Lower Body Dressing: +2 Total assistance Lower Body Dressing: Patient Percentage: 10% Where Assessed - Lower Body Dressing: Supported sit to stand Toilet Transfer: +2 Total assistance Toilet Transfer: Patient Percentage: 30% Toilet Transfer Method: Sit to Loss adjuster, chartered: Other (comment) (recliner chair) Tub/Shower Transfer Method: Not assessed Equipment Used: Gait belt Transfers/Ambulation Related to ADLs: +2 Total A ADL Comments: Educated on dressing technique. Told family/caregiver to have RUE supported on pillow. OT assisted in donning/doffing socks. Educated caregiver on  how to assist with feeding.    Mobility    Bed Mobility  Overal bed mobility: Needs Assistance;+2 for physical assistance  Bed Mobility: Supine to Sit  Supine to sit: Max assist;+2 for physical assistance  General bed mobility comments: cues for safe technique and initiation of movement.  Transfers  Overall transfer level: Needs assistance  Equipment used: 2 person hand held assist  Transfers: Sit to/from [facility]  Sit to Stand: Max assist;+2 physical assistance  Stand pivot transfers: Max assist;+2 physical assistance  General transfer comment: cues for positioning of LEs, anterior wt shift with coming to stand, movement of LEs through SPT. Repeated standing from chair x3 with cues for wt shift anterior over BOS, trunk/hip extension, upright posture. Utilized pad under hips to A with transfers.  Modified Rankin (Stroke Patients Only)    Transfers    Ambulation / Gait / Stairs / Office manager / Balance Dynamic Sitting Balance Dynamic Sitting - Comments: Pt sat EOB x 10 minutes with min to mod A.  Had pt prop on lt elbow to attempt to bring him closer to midline. Then able to briefly (10 sec) sit with min guard    Special needs/care consideration Bowel mgmt: continent Bladder mgmt: continent    Previous Home Environment Living Arrangements: Spouse/significant other  Lives With: Spouse Available Help at Discharge: Other (Comment) Matthew Hodge, neighbor and caregiver assists 4 to 5 hrs per d) Type of Home: House Home Layout: One level Home Access: Stairs to enter Entrance  Stairs-Rails: None Entrance Stairs-Number of Steps: 2 Bathroom Shower/Tub: Multimedia programmer: Handicapped height Bathroom Accessibility: Yes How Accessible: Accessible via walker Amelia: No Additional Comments: son and dtr very involved; [name] has been assisting since November; can do more hrs as needed  Discharge Living Setting Plans for  Discharge Living Setting: Patient's home;Lives with (comment) (wife) Type of Home at Discharge: House Discharge Home Layout: One level Discharge Home Access: Stairs to enter Entrance Stairs-Rails: None Entrance Stairs-Number of Steps: 2 Discharge Bathroom Shower/Tub: Walk-in shower;Other (comment) (with all needed handicapped accesories in bathroom) Discharge Bathroom Toilet: Handicapped height Discharge Bathroom Accessibility: Yes How Accessible: Accessible via walker Does the patient have any problems obtaining your medications?: No  Social/Family/Support Systems Patient Roles: Spouse;Parent Contact Information: Arlow Spiers wife and children Anticipated Caregiver: Wife and neighbor/caregiver Matthew Hodge) as before admission Anticipated Caregiver's Contact Information: see above Ability/Limitations of Caregiver: wife walks with cane. Physical assist provided by [name] Caregiver Availability: 24/7 Discharge Plan Discussed with Primary Caregiver: Yes Is Caregiver In Agreement with Plan?: Yes Does Caregiver/Family have Issues with Lodging/Transportation while Pt is in Rehab?: No  Matthew Hodge neighbor and caregiver since 11/14. Gary's Dad was a pt at CIR multiple times / His Dad has since died 2023/01/16.  Goals/Additional Needs Patient/Family Goal for Rehab: supervision to min assist with PT, OT, and SLP Expected length of stay: ELOS 20 to 27 days Pt/Family Agrees to Admission and willing to participate: Yes Program Orientation Provided & Reviewed with Pt/Caregiver Including Roles  & Responsibilities: Yes   Decrease burden of Care through IP rehab admission: n/a  Possible need for SNF placement upon discharge: not anticipated   Patient Condition: This patient's condition remains as documented in the consult dated 08/31/13, in which the Rehabilitation Physician determined and documented that the patient's condition is appropriate for intensive rehabilitative care in an inpatient  rehabilitation facility. Will admit to inpatient rehab today.  Preadmission Screen Completed By:  Cleatrice Burke, 09/01/2013 2:51 PM ______________________________________________________________________   Discussed status with Dr. Naaman Plummer on 09/01/13 at  1458 and received telephone approval for admission today.  Admission Coordinator:  Cleatrice Burke, time 2229 Date 09/01/13.

## 2013-09-01 NOTE — H&P (Signed)
Physical Medicine and Rehabilitation Admission H&P      Chief complaint: Right-sided weakness  HPI: Matthew Hodge is a 80 y.o. right-handed male was documented history of Alzheimer's disease. Patient lives with his wife and used a cane prior to admission. Patient on no scheduled medications prior to admission. Admitted 08/27/2013 with right side weakness and altered mental status. Cranial CT scan showed no acute abnormalities. Patient did receive TPA. MRI of the brain showed small patchy areas of acute infarct in the left parietal lobe. Possible 5 mm acute infarct right parietal white matter. MRA of the head with no large vessel occlusion. Echocardiogram with ejection fraction of 55% and normal systolic function.  Carotid Dopplers with no ICA stenosis. EEG negative for seizure. Neurology services consulted maintained on aspirin for CVA prophylaxis. Patient is tolerating a regular diet. Physical and occupational therapy evaluations completed 08/28/2013 with recommendations for physical medicine rehabilitation consult to consider inpatient rehabilitation services.Patient was felt to be a good canidate for inpatient rehabilitation services was admitted for comprehensive rehabilitation program.   ROS Review of Systems  Psychiatric/Behavioral: Positive for memory loss premorbidly  Past Medical History  Diagnosis Date  . Alzheimer's disease   . Skin cancer   . Chronic constipation    Past Surgical History  Procedure Laterality Date  . Hand tendon surgery    . Cataract extraction, bilateral    . Skin cancer removal forehead    . Green light laser turp (transurethral resection of prostate  08/11/2012    Procedure: GREEN LIGHT LASER TURP (TRANSURETHRAL RESECTION OF PROSTATE;  Surgeon: Ailene Rud, MD;  Location: [facility];  Service: Urology;  Laterality: N/A;  Julianne Rice Laser of Prostate  . Insertion of suprapubic catheter  08/11/2012    Procedure: INSERTION OF SUPRAPUBIC CATHETER;   Surgeon: Ailene Rud, MD;  Location: [facility];  Service: Urology;  Laterality: N/A;  . Circumcision  08/11/2012    Procedure: CIRCUMCISION ADULT;  Surgeon: Ailene Rud, MD;  Location: [facility];  Service: Urology;  Laterality: N/A;  DORSAL SLIT CIRCUMCISION  . Cystoscopy  08/11/2012    Procedure: CYSTOSCOPY;  Surgeon: Ailene Rud, MD;  Location: [facility];  Service: Urology;  Laterality: N/A;   Family History  Problem Relation Age of Onset  . Stroke Mother   . Stroke Father   . COPD Brother   . Heart disease Brother   . Diabetes Brother    Social History:  reports that he has never smoked. He has never used smokeless tobacco. He reports that he does not drink alcohol or use illicit drugs. Allergies: No Known Allergies No prescriptions prior to admission    Home: Home Living Family/patient expects to be discharged to:: Private residence Living Arrangements: Spouse/significant other Available Help at Discharge: Family;Neighbor;Available PRN/intermittently Type of Home: House Home Access: Stairs to enter [employer] of Steps: 2 Entrance Stairs-Rails: None Home Layout: One level Home Equipment: Cane - single point;Bedside commode;Grab bars - tub/shower;Shower seat Additional Comments: Per old chart son and daughter live nearby and can offer assistance.  Lives With: Spouse   Functional History: Prior Function Vocation: Retired  Functional Status:  Mobility:          ADL: ADL Eating/Feeding: Minimal assistance Where Assessed - Eating/Feeding: Chair Grooming: Set up Where Assessed - Grooming: Supported sitting Lower Body Dressing: +2 Total assistance Where Assessed - Lower Body Dressing: Supported sit to Lobbyist: +2 Total assistance Toilet Transfer Method: Sit to Loss adjuster, chartered:  Other (comment) (recliner chair) Tub/Shower Transfer Method: Not assessed Equipment Used: Gait belt Transfers/Ambulation Related  to ADLs: +2 Total A ADL Comments: Educated on dressing technique. Told family/caregiver to have RUE supported on pillow. OT assisted in donning/doffing socks. Educated caregiver on how to assist with feeding.  Cognition: Cognition Overall Cognitive Status: Impaired/Different from baseline Arousal/Alertness: Awake/alert Orientation Level: Oriented to person;Oriented to place Attention: Focused Focused Attention: Impaired Focused Attention Impairment: Functional basic Memory: Impaired Memory Impairment: Decreased recall of new information;Decreased long term memory;Decreased short term memory Decreased Long Term Memory: Functional basic;Verbal basic Decreased Short Term Memory: Verbal basic;Functional basic Awareness: Impaired Awareness Impairment: Intellectual impairment Problem Solving: Impaired Problem Solving Impairment: Verbal basic;Functional basic Executive Function: Reasoning;Sequencing;Organizing Reasoning: Impaired Reasoning Impairment: Verbal basic;Functional basic Sequencing: Impaired Sequencing Impairment: Verbal basic;Functional basic Organizing: Impaired Organizing Impairment: Verbal basic;Functional basic Cognition Arousal/Alertness: Awake/alert Behavior During Therapy: WFL for tasks assessed/performed Overall Cognitive Status: Impaired/Different from baseline Area of Impairment: Orientation;Problem solving;Memory Orientation Level: Disoriented to;Place;Time Memory: Decreased short-term memory Following Commands: Follows one step commands consistently Safety/Judgement: Decreased awareness of safety;Decreased awareness of deficits Awareness: Emergent;Anticipatory Problem Solving: Slow processing;Requires verbal cues;Requires tactile cues;Difficulty sequencing  Physical Exam: Blood pressure 135/83, pulse 87, temperature 98.1 F (36.7 C), temperature source Oral, resp. rate 18, height 6' (1.829 m), weight 82.4 kg (181 lb 10.5 oz), SpO2 94.00%. Physical Exam Vitals  reviewed.  HENT:  Head: Normocephalic.  Eyes: EOM are normal.  Neck: Normal range of motion. Neck supple. No thyromegaly present.  Cardiovascular: Normal rate and regular rhythm.  Respiratory: Effort normal and breath sounds normal. No respiratory distress.  GI: Bowel sounds are normal. He exhibits no distension.  Neurological:  Patient was arousable and made eye contact with examiner. He was nonverbal except would answer yes no questions but inconsistent. He was not able to provide his name and was inconsistent in follow commands. Insight and awareness is limited. Was able to id simple objects and reada a short sentence. Told me who is wife was. RUE is 1 to 1+ and inconsistent. RLE is similar and 1 to 1+ prox to 1- distally at the ankle. Senses pain. No resting tone.  Skin: Skin is warm and dry.  Psychiatric:  Flat, fatigued   No results found for this or any previous visit (from the past 48 hour(s)). No results found.  Post Admission Physician Evaluation: 1. Functional deficits secondary  to thrombotic right MCA infarct. 2. Patient is admitted to receive collaborative, interdisciplinary care between the physiatrist, rehab nursing staff, and therapy team. 3. Patient's level of medical complexity and substantial therapy needs in context of that medical necessity cannot be provided at a lesser intensity of care such as a SNF. 4. Patient has experienced substantial functional loss from his/her baseline which was documented above under the "Functional History" and "Functional Status" headings.  Judging by the patient's diagnosis, physical exam, and functional history, the patient has potential for functional progress which will result in measurable gains while on inpatient rehab.  These gains will be of substantial and practical use upon discharge  in facilitating mobility and self-care at the household level. 5. Physiatrist will provide 24 hour management of medical needs as well as oversight of  the therapy plan/treatment and provide guidance as appropriate regarding the interaction of the two. 6. 24 hour rehab nursing will assist with bladder management, bowel management, safety, skin/wound care, disease management, medication administration, pain management and patient education  and help integrate therapy concepts, techniques,education, etc. 7. PT will  assess and treat for/with: Lower extremity strength, range of motion, stamina, balance, functional mobility, safety, adaptive techniques and equipment, NMR, visual spatial awareness, cognitive perceptual awareness.   Goals are: min assist +. 8. OT will assess and treat for/with: ADL's, functional mobility, safety, upper extremity strength, adaptive techniques and equipment, NMR, cognitive perceptual and visual perceptual awareness.   Goals are: min to mod assist. 9. SLP will assess and treat for/with: cognition, swallowing, communication.  Goals are: mod assist. 10. Case Management and Social Worker will assess and treat for psychological issues and discharge planning. 11. Team conference will be held weekly to assess progress toward goals and to determine barriers to discharge. 12. Patient will receive at least 3 hours of therapy per day at least 5 days per week. 13. ELOS: 2-3 weeks depending upon carryover, engagement and whether focus is more on family education       4. Prognosis:  good   Medical Problem List and Plan: 1. Thrombotic Right MCA infarct 2. DVT Prophylaxis/Anticoagulation: SCDs.Monitor for any signs of DVT. 3. Pain Management: Tylenol as needed 4. Mood/dementia: baseline cognitive deficits prior to CVA. Perhaps some mild improvement just before DVA.  - Bed alarm for safety  -might benefit from trial of memory aid 5. Neuropsych: This patient is not capable of making decisions on his own behalf.    Meredith Staggers, MD, [facility]   09/01/2013

## 2013-09-01 NOTE — Discharge Summary (Signed)
Stroke Discharge Summary  Patient ID: shown melrose   MRN: [id number]      DOB: 17-Mar-1925  Date of Admission: 08/27/2013 Date of Discharge: 09/01/2013  Attending Physician:  Suzzanne Cloud, MD, Stroke MD  Consulting Physician(s):    Alger Simons, MD (Physical Medicine & Rehabtilitation)  Patient's PCP:  Odette Fraction, MD  Discharge Diagnoses:  Principal Problem:   Stroke - bicerebral embolic infarcts, source unknown, s/p IV tPA Active Problems:   Alzheimer's disease BMI  Body mass index is 24.63 kg/(m^2).   Past Medical History  Diagnosis Date  . Alzheimer's disease   . Skin cancer   . Chronic constipation    Past Surgical History  Procedure Laterality Date  . Hand tendon surgery    . Cataract extraction, bilateral    . Skin cancer removal forehead    . Green light laser turp (transurethral resection of prostate  08/11/2012    Procedure: GREEN LIGHT LASER TURP (TRANSURETHRAL RESECTION OF PROSTATE;  Surgeon: Ailene Rud, MD;  Location: [facility];  Service: Urology;  Laterality: N/A;  Julianne Rice Laser of Prostate  . Insertion of suprapubic catheter  08/11/2012    Procedure: INSERTION OF SUPRAPUBIC CATHETER;  Surgeon: Ailene Rud, MD;  Location: [facility];  Service: Urology;  Laterality: N/A;  . Circumcision  08/11/2012    Procedure: CIRCUMCISION ADULT;  Surgeon: Ailene Rud, MD;  Location: [facility];  Service: Urology;  Laterality: N/A;  DORSAL SLIT CIRCUMCISION  . Cystoscopy  08/11/2012    Procedure: CYSTOSCOPY;  Surgeon: Ailene Rud, MD;  Location: [facility];  Service: Urology;  Laterality: N/A;    Medications to be continued on Rehab . aspirin  325 mg Oral Daily  . pantoprazole  40 mg Oral Daily  . [START ON 09/02/2013] senna-docusate  1 tablet Oral q morning - 10a  . simvastatin  20 mg Oral q1800    LABORATORY STUDIES CBC    Component Value Date/Time   WBC 7.6 08/27/2013 1123   RBC 4.37 08/27/2013 1123   HGB 15.0 08/27/2013  1133   HCT 44.0 08/27/2013 1133   PLT 311 08/27/2013 1123   MCV 92.9 08/27/2013 1123   MCH 31.6 08/27/2013 1123   MCHC 34.0 08/27/2013 1123   RDW 12.9 08/27/2013 1123   LYMPHSABS 1.3 08/27/2013 1123   MONOABS 0.6 08/27/2013 1123   EOSABS 0.2 08/27/2013 1123   BASOSABS 0.0 08/27/2013 1123   CMP    Component Value Date/Time   NA 137 08/27/2013 1133   K 4.6 08/27/2013 1133   CL 100 08/27/2013 1133   CO2 26 08/27/2013 1123   GLUCOSE 107* 08/27/2013 1133   BUN 21 08/27/2013 1133   CREATININE 1.10 08/27/2013 1133   CREATININE 1.11 01/19/2013 1023   CALCIUM 9.1 08/27/2013 1123   PROT 7.3 08/27/2013 1123   ALBUMIN 3.8 08/27/2013 1123   AST 18 08/27/2013 1123   ALT 10 08/27/2013 1123   ALKPHOS 91 08/27/2013 1123   BILITOT 0.4 08/27/2013 1123   GFRNONAA 59* 08/27/2013 1123   GFRAA 69* 08/27/2013 1123   COAGS Lab Results  Component Value Date   INR 1.00 08/27/2013   Lipid Panel    Component Value Date/Time   CHOL 181 08/28/2013 0735   TRIG 72 08/28/2013 0735   HDL 41 08/28/2013 0735   CHOLHDL 4.4 08/28/2013 0735   VLDL 14 08/28/2013 0735   LDLCALC 126* 08/28/2013 0735   HgbA1C  Lab Results  Component Value  Date   HGBA1C 6.0* 08/28/2013   Cardiac Panel (last 3 results) No results found for this basename: CKTOTAL, CKMB, TROPONINI, RELINDX,  in the last 72 hours Urinalysis    Component Value Date/Time   COLORURINE YELLOW 01/19/2013 1036   APPEARANCEUR CLOUDY* 01/19/2013 1036   LABSPEC 1.015 01/19/2013 1036   PHURINE 7.0 01/19/2013 1036   GLUCOSEU NEG 01/19/2013 1036   HGBUR SMALL* 01/19/2013 1036   BILIRUBINUR NEG 01/19/2013 1036   KETONESUR NEG 01/19/2013 1036   PROTEINUR NEG 01/19/2013 1036   UROBILINOGEN 0.2 01/19/2013 1036   NITRITE POS* 01/19/2013 1036   LEUKOCYTESUR SMALL* 01/19/2013 1036   Urine Drug Screen  No results found for this basename: labopia, cocainscrnur, labbenz, amphetmu, thcu, labbarb    Alcohol Level No results found for this basename: eth   SIGNIFICANT DIAGNOSTIC STUDIES CT of the brain 08/27/2013 Progression of atrophy since  2010. Marland Kitchen No acute abnormality.  MRI of the brain Scattered small areas of acute infarct in the left parietal lobe.  Possible small area of acute infarct in the right parietal white  matter which shows questionable restricted diffusion.Arachnoid cyst left middle cranial  fossa measuring 29 x 39 mm.  MRA of the brain Moderately severe intracranial atherosclerotic disease. No large  vessel occlusion.  2D Echocardiogram EF 50-55% with no source of embolus.  Carotid Doppler No evidence of hemodynamically significant internal carotid artery stenosis. Vertebral artery flow is antegrade.  CXR Not done  EKG normal sinus rhythm.  EEG : moderate bihemispheric slowing. No seizures    History of Present Illness   Matthew Hodge is an 80 y.o. male who takes no home medications. Patient was noted to be normal this AM 08/27/2013, had breakfast, talkative and moving all extremities. At 10 AM his neighbor (who helps care for him) noted patient walked to the chair and sat down. He had a blank stare on his face and was non-communicative. He was also noted to have right arm and leg weakness. EMS was called and patient was brought as a code stroke. On arrival patient would answer questions with "yes" otherwise aphasic. He showed right arm and leg hemiparesis. NIHSS was 13.Due to significant aphasia and right hemiparesis tPA was ordered. Of note: wife states he has had periods in the past where he will stare into space and tremble. These episodes, however, have never presented with prolonged inability to speak or right arm and leg weakness. Patient was administered TPA . He was admitted to the neuro ICU for further evaluation and treatment.  Hospital Course Patient tolerated tPA without complication. Imaging at 24 hours shows no hemorrhage. MRI confirmed bicerebral embolic infarcts. Strokes were felt to be embolic, though source unknown. Given patient not a antiocoagulation candidate due to dementia and fall risk, will not  pursue TEE or other embolic workup. On no antithrombotics per history. Started on aspirin 325 mg orally every day for secondary stroke prevention.   Patient with vascular risk factors of:  Family hx stroke (mother, father)  Patient also with:  Alzheimer's disease baseline   Patient with resultant Mild right hemiparesis. Physical therapy, occupational therapy and speech therapy evaluated patient. All agreed inpatient rehab is needed. Patient's family is/are supportive and can provide care at discharge. CIR bed is available today and patient will be transferred there.  Discharge Exam  Blood pressure 135/83, pulse 87, temperature 98.1 F (36.7 C), temperature source Oral, resp. rate 18, height 6' (1.829 m), weight 82.4 kg (181 lb 10.5 oz), SpO2  94.00%.  Frail elderly caucasian male not in distress.Awake alert. Afebrile. Head is nontraumatic. Neck is supple without bruit. Hearing is normal. Cardiac exam no murmur or gallop. Lungs are clear to auscultation. Distal pulses are well felt.  Neurological Exam : Awake alert, oriented to person and time only. Diminished attention, registration and recall. Nonfluent speech short sentences and monosyllables only. Follows one step commands only. No gaze deviation. Not cooperative for visual fileds test but blinks to threat bilaterally.no face weakness. Decrease hearing bilaterallyTongue midline. Minimal RUE and marked RLE drift present. Mild right grip weak. No sensory loss. Plantars both equivocal. Gait not tested.Marland Kitchen NIHSS 8   Discharge Diet  Cardiac thin liquids  Discharge Plan  Disposition:  Transfer to [facility] for ongoing PT, OT and ST  aspirin 325 mg orally every day for secondary stroke prevention.  Recommend ongoing risk factor control by Primary Care Physician at time of discharge from inpatient rehabilitation.  Follow-up PICKARD,WARREN TOM, MD in 1 month following discharge from rehab.  Follow-up with Dr. Antony Contras, Stroke  Clinic in 2 months.  30 minutes were spent preparing discharge.  Signed Burnetta Sabin, AVNP, ANP-BC, [facility] Nurse Practitioner 09/01/2013, 12:49 PM  I have personally examined this patient, reviewed pertinent data and developed the plan of care. I agree with above.  Antony Contras, MD

## 2013-09-01 NOTE — Progress Notes (Signed)
I met with pt, his wife, and care gvier at bedside. Discussed inpt rehab venue as an option for his recovery and they are in agreement. I will contact Burnetta Sabin, [facility] and arrange. [phone]

## 2013-09-01 NOTE — H&P (View-Only) (Signed)
Physical Medicine and Rehabilitation Admission H&P      Chief complaint: Right-sided weakness  HPI: Matthew Hodge is a 80 y.o. right-handed male was documented history of Alzheimer's disease. Patient lives with his wife and used a cane prior to admission. Patient on no scheduled medications prior to admission. Admitted 08/27/2013 with right side weakness and altered mental status. Cranial CT scan showed no acute abnormalities. Patient did receive TPA. MRI of the brain showed small patchy areas of acute infarct in the left parietal lobe. Possible 5 mm acute infarct right parietal white matter. MRA of the head with no large vessel occlusion. Echocardiogram with ejection fraction of 55% and normal systolic function.  Carotid Dopplers with no ICA stenosis. EEG negative for seizure. Neurology services consulted maintained on aspirin for CVA prophylaxis. Patient is tolerating a regular diet. Physical and occupational therapy evaluations completed 08/28/2013 with recommendations for physical medicine rehabilitation consult to consider inpatient rehabilitation services.Patient was felt to be a good canidate for inpatient rehabilitation services was admitted for comprehensive rehabilitation program.   ROS Review of Systems  Psychiatric/Behavioral: Positive for memory loss premorbidly  Past Medical History  Diagnosis Date  . Alzheimer's disease   . Skin cancer   . Chronic constipation    Past Surgical History  Procedure Laterality Date  . Hand tendon surgery    . Cataract extraction, bilateral    . Skin cancer removal forehead    . Green light laser turp (transurethral resection of prostate  08/11/2012    Procedure: GREEN LIGHT LASER TURP (TRANSURETHRAL RESECTION OF PROSTATE;  Surgeon: Sigmund I Tannenbaum, MD;  Location: [facility];  Service: Urology;  Laterality: N/A;  Green Light Laser of Prostate  . Insertion of suprapubic catheter  08/11/2012    Procedure: INSERTION OF SUPRAPUBIC CATHETER;   Surgeon: Sigmund I Tannenbaum, MD;  Location: [facility];  Service: Urology;  Laterality: N/A;  . Circumcision  08/11/2012    Procedure: CIRCUMCISION ADULT;  Surgeon: Sigmund I Tannenbaum, MD;  Location: [facility];  Service: Urology;  Laterality: N/A;  DORSAL SLIT CIRCUMCISION  . Cystoscopy  08/11/2012    Procedure: CYSTOSCOPY;  Surgeon: Sigmund I Tannenbaum, MD;  Location: [facility];  Service: Urology;  Laterality: N/A;   Family History  Problem Relation Age of Onset  . Stroke Mother   . Stroke Father   . COPD Brother   . Heart disease Brother   . Diabetes Brother    Social History:  reports that he has never smoked. He has never used smokeless tobacco. He reports that he does not drink alcohol or use illicit drugs. Allergies: No Known Allergies No prescriptions prior to admission    Home: Home Living Family/patient expects to be discharged to:: Private residence Living Arrangements: Spouse/significant other Available Help at Discharge: Family;Neighbor;Available PRN/intermittently Type of Home: House Home Access: Stairs to enter Entrance Stairs-Number of Steps: 2 Entrance Stairs-Rails: None Home Layout: One level Home Equipment: Cane - single point;Bedside commode;Grab bars - tub/shower;Shower seat Additional Comments: Per old chart son and daughter live nearby and can offer assistance.  Lives With: Spouse   Functional History: Prior Function Vocation: Retired  Functional Status:  Mobility:          ADL: ADL Eating/Feeding: Minimal assistance Where Assessed - Eating/Feeding: Chair Grooming: Set up Where Assessed - Grooming: Supported sitting Lower Body Dressing: +2 Total assistance Where Assessed - Lower Body Dressing: Supported sit to stand Toilet Transfer: +2 Total assistance Toilet Transfer Method: Sit to stand Toilet Transfer Equipment:   Other (comment) (recliner chair) Tub/Shower Transfer Method: Not assessed Equipment Used: Gait belt Transfers/Ambulation Related  to ADLs: +2 Total A ADL Comments: Educated on dressing technique. Told family/caregiver to have RUE supported on pillow. OT assisted in donning/doffing socks. Educated caregiver on how to assist with feeding.  Cognition: Cognition Overall Cognitive Status: Impaired/Different from baseline Arousal/Alertness: Awake/alert Orientation Level: Oriented to person;Oriented to place Attention: Focused Focused Attention: Impaired Focused Attention Impairment: Functional basic Memory: Impaired Memory Impairment: Decreased recall of new information;Decreased long term memory;Decreased short term memory Decreased Long Term Memory: Functional basic;Verbal basic Decreased Short Term Memory: Verbal basic;Functional basic Awareness: Impaired Awareness Impairment: Intellectual impairment Problem Solving: Impaired Problem Solving Impairment: Verbal basic;Functional basic Executive Function: Reasoning;Sequencing;Organizing Reasoning: Impaired Reasoning Impairment: Verbal basic;Functional basic Sequencing: Impaired Sequencing Impairment: Verbal basic;Functional basic Organizing: Impaired Organizing Impairment: Verbal basic;Functional basic Cognition Arousal/Alertness: Awake/alert Behavior During Therapy: WFL for tasks assessed/performed Overall Cognitive Status: Impaired/Different from baseline Area of Impairment: Orientation;Problem solving;Memory Orientation Level: Disoriented to;Place;Time Memory: Decreased short-term memory Following Commands: Follows one step commands consistently Safety/Judgement: Decreased awareness of safety;Decreased awareness of deficits Awareness: Emergent;Anticipatory Problem Solving: Slow processing;Requires verbal cues;Requires tactile cues;Difficulty sequencing  Physical Exam: Blood pressure 135/83, pulse 87, temperature 98.1 F (36.7 C), temperature source Oral, resp. rate 18, height 6' (1.829 m), weight 82.4 kg (181 lb 10.5 oz), SpO2 94.00%. Physical Exam Vitals  reviewed.  HENT:  Head: Normocephalic.  Eyes: EOM are normal.  Neck: Normal range of motion. Neck supple. No thyromegaly present.  Cardiovascular: Normal rate and regular rhythm.  Respiratory: Effort normal and breath sounds normal. No respiratory distress.  GI: Bowel sounds are normal. He exhibits no distension.  Neurological:  Patient was arousable and made eye contact with examiner. He was nonverbal except would answer yes no questions but inconsistent. He was not able to provide his name and was inconsistent in follow commands. Insight and awareness is limited. Was able to id simple objects and reada a short sentence. Told me who is wife was. RUE is 1 to 1+ and inconsistent. RLE is similar and 1 to 1+ prox to 1- distally at the ankle. Senses pain. No resting tone.  Skin: Skin is warm and dry.  Psychiatric:  Flat, fatigued   No results found for this or any previous visit (from the past 48 hour(s)). No results found.  Post Admission Physician Evaluation: 1. Functional deficits secondary  to thrombotic right MCA infarct. 2. Patient is admitted to receive collaborative, interdisciplinary care between the physiatrist, rehab nursing staff, and therapy team. 3. Patient's level of medical complexity and substantial therapy needs in context of that medical necessity cannot be provided at a lesser intensity of care such as a SNF. 4. Patient has experienced substantial functional loss from his/her baseline which was documented above under the "Functional History" and "Functional Status" headings.  Judging by the patient's diagnosis, physical exam, and functional history, the patient has potential for functional progress which will result in measurable gains while on inpatient rehab.  These gains will be of substantial and practical use upon discharge  in facilitating mobility and self-care at the household level. 5. Physiatrist will provide 24 hour management of medical needs as well as oversight of  the therapy plan/treatment and provide guidance as appropriate regarding the interaction of the two. 6. 24 hour rehab nursing will assist with bladder management, bowel management, safety, skin/wound care, disease management, medication administration, pain management and patient education  and help integrate therapy concepts, techniques,education, etc. 7. PT will  assess and treat for/with: Lower extremity strength, range of motion, stamina, balance, functional mobility, safety, adaptive techniques and equipment, NMR, visual spatial awareness, cognitive perceptual awareness.   Goals are: min assist +. 8. OT will assess and treat for/with: ADL's, functional mobility, safety, upper extremity strength, adaptive techniques and equipment, NMR, cognitive perceptual and visual perceptual awareness.   Goals are: min to mod assist. 9. SLP will assess and treat for/with: cognition, swallowing, communication.  Goals are: mod assist. 10. Case Management and Social Worker will assess and treat for psychological issues and discharge planning. 11. Team conference will be held weekly to assess progress toward goals and to determine barriers to discharge. 12. Patient will receive at least 3 hours of therapy per day at least 5 days per week. 13. ELOS: 2-3 weeks depending upon carryover, engagement and whether focus is more on family education       4. Prognosis:  good   Medical Problem List and Plan: 1. Thrombotic Right MCA infarct 2. DVT Prophylaxis/Anticoagulation: SCDs.Monitor for any signs of DVT. 3. Pain Management: Tylenol as needed 4. Mood/dementia: baseline cognitive deficits prior to CVA. Perhaps some mild improvement just before DVA.  - Bed alarm for safety  -might benefit from trial of memory aid 5. Neuropsych: This patient is not capable of making decisions on his own behalf.    Meredith Staggers, MD, [facility]   09/01/2013

## 2013-09-01 NOTE — Progress Notes (Signed)
Physical Therapy Treatment Patient Details Name: Matthew Hodge MRN: [id number] DOB: October 06, 1924 Today's Date: 09/01/2013 Time: 2952-8413 PT Time Calculation (min): 27 min  PT Assessment / Plan / Recommendation  History of Present Illness Pt adm with lt CVA and given TPA.   PT Comments   Pt very agreeable to participate with mobility.  Pt continues to require 2nd person to A with mobility for lifting A and safety.  Will continue to follow.    Follow Up Recommendations  CIR     Does the patient have the potential to tolerate intense rehabilitation     Barriers to Discharge        Equipment Recommendations  None recommended by PT    Recommendations for Other Services    Frequency Min 4X/week   Progress towards PT Goals Progress towards PT goals: Progressing toward goals  Plan Current plan remains appropriate    Precautions / Restrictions Precautions Precautions: Fall Restrictions Weight Bearing Restrictions: No   Pertinent Vitals/Pain Denied pain.      Mobility  Bed Mobility Overal bed mobility: Needs Assistance;+2 for physical assistance Bed Mobility: Supine to Sit Supine to sit: Max assist;+2 for physical assistance General bed mobility comments: cues for safe technique and initiation of movement.   Transfers Overall transfer level: Needs assistance Equipment used: 2 person hand held assist Transfers: Sit to/from [facility] Sit to Stand: Max assist;+2 physical assistance Stand pivot transfers: Max assist;+2 physical assistance General transfer comment: cues for positioning of LEs, anterior wt shift with coming to stand, movement of LEs through SPT.  Repeated standing from chair x3 with cues for wt shift anterior over BOS, trunk/hip extension, upright posture.  Utilized pad under hips to A with transfers.   Modified Rankin (Stroke Patients Only) Pre-Morbid Rankin Score: Moderate disability Modified Rankin: Severe disability    Exercises     PT  Diagnosis:    PT Problem List:   PT Treatment Interventions:     PT Goals (current goals can now be found in the care plan section) Acute Rehab PT Goals Patient Stated Goal: did not state Time For Goal Achievement: 09/11/13 Potential to Achieve Goals: Good  Visit Information  Last PT Received On: 09/01/13 Assistance Needed: +2 History of Present Illness: Pt adm with lt CVA and given TPA.    Subjective Data  Patient Stated Goal: did not state   Cognition  Cognition Arousal/Alertness: Awake/alert Behavior During Therapy: WFL for tasks assessed/performed Overall Cognitive Status: Impaired/Different from baseline Area of Impairment: Orientation;Memory;Safety/judgement;Awareness;Problem solving Orientation Level: Disoriented to;Time Memory: Decreased short-term memory Following Commands: Follows one step commands consistently Safety/Judgement: Decreased awareness of safety;Decreased awareness of deficits Awareness: Emergent;Anticipatory Problem Solving: Slow processing;Requires verbal cues;Requires tactile cues;Difficulty sequencing    Balance  Balance Overall balance assessment: Needs assistance Sitting-balance support: Bilateral upper extremity supported;Feet supported Sitting balance-Leahy Scale: Poor  End of Session PT - End of Session Equipment Utilized During Treatment: Gait belt Activity Tolerance: Patient tolerated treatment well Patient left: in chair;with call bell/phone within reach;with chair alarm set;with family/visitor present Nurse Communication: Mobility status;Need for lift equipment   GP     Catarina Hartshorn, [place] 09/01/2013, 12:14 PM

## 2013-09-02 ENCOUNTER — Inpatient Hospital Stay (HOSPITAL_COMMUNITY): Payer: Medicare Other

## 2013-09-02 ENCOUNTER — Inpatient Hospital Stay (HOSPITAL_COMMUNITY): Payer: Medicare Other | Admitting: Physical Therapy

## 2013-09-02 ENCOUNTER — Inpatient Hospital Stay (HOSPITAL_COMMUNITY): Payer: Medicare Other | Admitting: Occupational Therapy

## 2013-09-02 DIAGNOSIS — I6350 Cerebral infarction due to unspecified occlusion or stenosis of unspecified cerebral artery: Secondary | ICD-10-CM

## 2013-09-02 DIAGNOSIS — R339 Retention of urine, unspecified: Secondary | ICD-10-CM

## 2013-09-02 DIAGNOSIS — F0390 Unspecified dementia without behavioral disturbance: Secondary | ICD-10-CM

## 2013-09-02 LAB — CBC WITH DIFFERENTIAL/PLATELET
BASOS PCT: 0 % (ref 0–1)
Basophils Absolute: 0 10*3/uL (ref 0.0–0.1)
EOS ABS: 0.6 10*3/uL (ref 0.0–0.7)
Eosinophils Relative: 9 % — ABNORMAL HIGH (ref 0–5)
HCT: 37.9 % — ABNORMAL LOW (ref 39.0–52.0)
Hemoglobin: 13.1 g/dL (ref 13.0–17.0)
Lymphocytes Relative: 13 % (ref 12–46)
Lymphs Abs: 0.9 10*3/uL (ref 0.7–4.0)
MCH: 31.4 pg (ref 26.0–34.0)
MCHC: 34.6 g/dL (ref 30.0–36.0)
MCV: 90.9 fL (ref 78.0–100.0)
Monocytes Absolute: 0.7 10*3/uL (ref 0.1–1.0)
Monocytes Relative: 10 % (ref 3–12)
Neutro Abs: 4.7 10*3/uL (ref 1.7–7.7)
Neutrophils Relative %: 67 % (ref 43–77)
PLATELETS: 284 10*3/uL (ref 150–400)
RBC: 4.17 MIL/uL — ABNORMAL LOW (ref 4.22–5.81)
RDW: 12.9 % (ref 11.5–15.5)
WBC: 6.9 10*3/uL (ref 4.0–10.5)

## 2013-09-02 LAB — COMPREHENSIVE METABOLIC PANEL
ALK PHOS: 80 U/L (ref 39–117)
ALT: 11 U/L (ref 0–53)
AST: 21 U/L (ref 0–37)
Albumin: 3.3 g/dL — ABNORMAL LOW (ref 3.5–5.2)
BUN: 21 mg/dL (ref 6–23)
CO2: 25 mEq/L (ref 19–32)
Calcium: 9.1 mg/dL (ref 8.4–10.5)
Chloride: 101 mEq/L (ref 96–112)
Creatinine, Ser: 1.04 mg/dL (ref 0.50–1.35)
GFR calc non Af Amer: 63 mL/min — ABNORMAL LOW (ref >90)
GFR, EST AFRICAN AMERICAN: 73 mL/min — AB (ref >90)
GLUCOSE: 114 mg/dL — AB (ref 70–99)
POTASSIUM: 4.2 meq/L (ref 3.7–5.3)
SODIUM: 137 meq/L (ref 137–147)
TOTAL PROTEIN: 6.6 g/dL (ref 6.0–8.3)
Total Bilirubin: 0.3 mg/dL (ref 0.3–1.2)

## 2013-09-02 NOTE — Progress Notes (Signed)
Pt Removed IV.

## 2013-09-02 NOTE — Care Management Note (Signed)
[facility] Individual Statement of Services  Patient Name:  Matthew Hodge  Date:  09/02/2013  Welcome to the [facility].  Our goal is to provide you with an individualized program based on your diagnosis and situation, designed to meet your specific needs.  With this comprehensive rehabilitation program, you will be expected to participate in at least 3 hours of rehabilitation therapies Monday-Friday, with modified therapy programming on the weekends.  Your rehabilitation program will include the following services:  Physical Therapy (PT), Occupational Therapy (OT), Speech Therapy (ST), 24 hour per day rehabilitation nursing, Case Management (Social Worker), Rehabilitation Medicine, Nutrition Services and Pharmacy Services  Weekly team conferences will be held on Wednesday to discuss your progress.  Your Social Worker will talk with you frequently to get your input and to update you on team discussions.  Team conferences with you and your family in attendance may also be held.  Expected length of stay: 3-4 weeks Overall anticipated outcome: min/mod level  Depending on your progress and recovery, your program may change. Your Social Worker will coordinate services and will keep you informed of any changes. Your Social Worker's name and contact numbers are listed  below.  The following services may also be recommended but are not provided by the [facility]:    [facility] will be made to provide these services after discharge if needed.  Arrangements include referral to agencies that provide these services.  Your insurance has been verified to be:  Medicare & [employer] Your primary doctor is:  Dr Jenna Luo  Pertinent information will be shared with your doctor and your insurance company.  Social Worker:  Ovidio Kin, [place] or [phone]  Information discussed with and copy given to patient by: Elease Hashimoto, 09/02/2013, 3:06 PM

## 2013-09-02 NOTE — Progress Notes (Signed)
Patient information reviewed and entered into eRehab system by Syncere Eble, RN, CRRN, PPS Coordinator.  Information including medical coding and functional independence measure will be reviewed and updated through discharge.     Per nursing patient was given "Data Collection Information Summary for Patients in Inpatient Rehabilitation Facilities with attached "Privacy Act Statement-Health Care Records" upon admission.  

## 2013-09-02 NOTE — Progress Notes (Signed)
Social Work Assessment and Plan Social Work Assessment and Plan  Patient Details  Name: Matthew Hodge MRN: [id number] Date of Birth: 12-06-1924  Today's Date: 09/02/2013  Problem List:  Patient Active Problem List   Diagnosis Date Noted  . CVA (cerebral infarction) 09/01/2013  . Stroke 08/27/2013  . Alzheimer's disease   . Enlarged prostate with urinary retention 07/15/2012  . Chronic constipation 07/15/2012  . Hyperglycemia 07/15/2012  . Acute renal failure 07/15/2012  . Leukocytosis 07/15/2012  . Normocytic anemia 07/15/2012  . UTI (lower urinary tract infection) 07/15/2012   Past Medical History:  Past Medical History  Diagnosis Date  . Alzheimer's disease   . Skin cancer   . Chronic constipation    Past Surgical History:  Past Surgical History  Procedure Laterality Date  . Hand tendon surgery    . Cataract extraction, bilateral    . Skin cancer removal forehead    . Green light laser turp (transurethral resection of prostate  08/11/2012    Procedure: GREEN LIGHT LASER TURP (TRANSURETHRAL RESECTION OF PROSTATE;  Surgeon: Ailene Rud, MD;  Location: [facility];  Service: Urology;  Laterality: N/A;  Julianne Rice Laser of Prostate  . Insertion of suprapubic catheter  08/11/2012    Procedure: INSERTION OF SUPRAPUBIC CATHETER;  Surgeon: Ailene Rud, MD;  Location: [facility];  Service: Urology;  Laterality: N/A;  . Circumcision  08/11/2012    Procedure: CIRCUMCISION ADULT;  Surgeon: Ailene Rud, MD;  Location: [facility];  Service: Urology;  Laterality: N/A;  DORSAL SLIT CIRCUMCISION  . Cystoscopy  08/11/2012    Procedure: CYSTOSCOPY;  Surgeon: Ailene Rud, MD;  Location: [facility];  Service: Urology;  Laterality: N/A;   Social History:  reports that he has never smoked. He has never used smokeless tobacco. He reports that he does not drink alcohol or use illicit drugs.  Family / Support Systems Marital Status: Married Patient Roles:  Spouse;Parent Spouse/Significant Other: Opal  [phone]-home Children: Don-son  [phone]-home  [phone] Other Supports: Gary-neighbor/caregiver Anticipated Caregiver: [name] plans to increase his hours with pt  Ability/Limitations of Caregiver: [name] to speak with children on increasing his hours Caregiver Availability: 24/7 Family Dynamics: Close knit family that looks out for one another.  They rely heavily on [name] their neighbor/caregiver who has been thorugh this with his own father.  Wife is a retired Therapist, sports and would like to be able to do for pt but he is to much for her.  Social History Preferred language: English Religion:  Cultural Background: No issues Education: High School Read: Yes Write: Yes Employment Status: Retired Freight forwarder Issues: No issues Guardian/Conservator: Don-POA MD feels pt is not capable of making his own decisions while here.  Will look toward son since he is the formal POA of pt to make any decisions while here.   Abuse/Neglect Physical Abuse: Denies Verbal Abuse: Denies Sexual Abuse: Denies Exploitation of patient/patient's resources: Denies Self-Neglect: Denies  Emotional Status Pt's affect, behavior adn adjustment status: Pt is smiling and eating his lunch.  He reports he wants to do well here.  [name] reports he stays active and he and him have activities to keep him active and moving.  [name] is here to provide support and familiarity with.  He is participating in therapies and adjusting to the unit. Recent Psychosocial Issues: Other medical issues-alzhiemers Pyschiatric History: No history-deferred depression screen due to pt's cognitive issues-unable to do a depression screen.  Will monitor through team and his therapies,  also [name] if pt changes and he is aware of. Substance Abuse History: No issues  Patient / Family Perceptions, Expectations & Goals Pt/Family understanding of illness & functional limitations: Son and [name] can explain  pt's stroke and deficits.  Both are hopeful he will make progress and do well here.  [name] hopes he retruns back to baseline, since he was doing well. Premorbid pt/family roles/activities: Husband, Father, Retiree, Grandfather, Church member, Health visitor, etc Anticipated changes in roles/activities/participation: resume at discharge Pt/family expectations/goals: Pt states: " I am fine."  Son states: " I hope he does well there, but we will see."  [name] states: " I will do what he needs, I know he needs care."  [employer]: None Premorbid Home Care/DME Agencies: Other (Comment) ([place] in the past) Transportation available at discharge: Family Resource referrals recommended: Support group (specify) (CVA & Alzhiemers Support group)  Discharge Planning Living Arrangements: Spouse/significant other Support Systems: Spouse/significant other;Children;Friends/neighbors;Church/faith community;Other (Comment) (Hired caregiver) Type of Residence: Private residence Insurance Resources: Education officer, museum (specify) Museum/gallery curator) Financial Resources: Radio broadcast assistant Screen Referred: No Living Expenses: Lives with family Money Management: Family Does the patient have any problems obtaining your medications?: No Home Management: Wife and hired Arts administrator Preliminary Plans: Return home with wife and hired Research officer, trade union, spoke with son and [name] pt will need 24 hr care at discharge from here.  [name] and son to disucss this with wife to see what will work and what is most comfortable with all.  [name] plans to be here to attend therapies with pt. Social Work Anticipated Follow Up Needs: HH/OP;Support Group  Clinical Impression Pleasant gentleman who is happy and smiling.  Son is supportive along with Tree surgeon.  Aware pt will require 24 hour care at discharge from rehab.  Son and [name] to discuss this and discuss with pt's wife Work on a safe discharge  plan.  Elease Hashimoto 09/02/2013, 3:03 PM

## 2013-09-02 NOTE — Evaluation (Signed)
I have reviewed and I agree with the following eval/treatment note.  Tylea Hise Hall, PT, DPT  

## 2013-09-02 NOTE — Progress Notes (Signed)
Social Work Patient ID: Matthew Hodge, male   DOB: 02/20/1925, 80 y.o.   MRN: [id number] Met with pt and gary and spoke with Don-son via telephone to inform of team conference goals-min/mid level and targeted discharge 2/7. They will get together with pt's wife to discuss the plan, Matthew Hodge realizes his hours will need to be increased and pt will need 24 hr care at discharge. Will work with family on a safe discharge plan for pt.  He seems to be adjusting to unit well.

## 2013-09-02 NOTE — Evaluation (Signed)
Occupational Therapy Assessment and Plan  Patient Details  Name: Matthew Hodge MRN: [id number] Date of Birth: 1924/10/09  OT Diagnosis: abnormal posture, cognitive deficits, hemiplegia affecting dominant side and muscle weakness (generalized) Rehab Potential: Rehab Potential: Good ELOS: 24-28 days   Today's Date: 09/02/2013 Time: 2992-4268 Time Calculation (min): 60 min  Problem List:  Patient Active Problem List   Diagnosis Date Noted  . CVA (cerebral infarction) 09/01/2013  . Stroke 08/27/2013  . Alzheimer's disease   . Enlarged prostate with urinary retention 07/15/2012  . Chronic constipation 07/15/2012  . Hyperglycemia 07/15/2012  . Acute renal failure 07/15/2012  . Leukocytosis 07/15/2012  . Normocytic anemia 07/15/2012  . UTI (lower urinary tract infection) 07/15/2012    Past Medical History:  Past Medical History  Diagnosis Date  . Alzheimer's disease   . Skin cancer   . Chronic constipation    Past Surgical History:  Past Surgical History  Procedure Laterality Date  . Hand tendon surgery    . Cataract extraction, bilateral    . Skin cancer removal forehead    . Green light laser turp (transurethral resection of prostate  08/11/2012    Procedure: GREEN LIGHT LASER TURP (TRANSURETHRAL RESECTION OF PROSTATE;  Surgeon: Ailene Rud, MD;  Location: [facility];  Service: Urology;  Laterality: N/A;  Julianne Rice Laser of Prostate  . Insertion of suprapubic catheter  08/11/2012    Procedure: INSERTION OF SUPRAPUBIC CATHETER;  Surgeon: Ailene Rud, MD;  Location: [facility];  Service: Urology;  Laterality: N/A;  . Circumcision  08/11/2012    Procedure: CIRCUMCISION ADULT;  Surgeon: Ailene Rud, MD;  Location: [facility];  Service: Urology;  Laterality: N/A;  DORSAL SLIT CIRCUMCISION  . Cystoscopy  08/11/2012    Procedure: CYSTOSCOPY;  Surgeon: Ailene Rud, MD;  Location: [facility];  Service: Urology;  Laterality: N/A;    Assessment &  Plan Clinical Impression: Patient is a 80 y.o. right-handed male was documented history of Alzheimer's disease. Patient lives with his wife and used a cane prior to admission. Patient on no scheduled medications prior to admission. Admitted 08/27/2013 with right side weakness and altered mental status. Cranial CT scan showed no acute abnormalities. Patient did receive TPA. MRI of the brain showed small patchy areas of acute infarct in the left parietal lobe. Possible 5 mm acute infarct right parietal white matter. MRA of the head with no large vessel occlusion. Echocardiogram with ejection fraction of 55% and normal systolic function.  Carotid Dopplers with no ICA stenosis. EEG negative for seizure. Neurology services consulted maintained on aspirin for CVA prophylaxis. Patient is tolerating a regular diet. Physical and occupational therapy evaluations completed 08/28/2013 with recommendations for physical medicine rehabilitation consult to consider inpatient rehabilitation services.Patient was felt to be a good canidate for inpatient rehabilitation services.   Patient transferred to CIR on 09/01/2013 .    Patient currently requires total with basic self-care skills secondary to muscle weakness, impaired timing and sequencing and decreased coordination, decreased midline orientation and decreased attention to right, decreased awareness, decreased problem solving, decreased safety awareness and decreased memory and decreased sitting balance and decreased standing balance.  Prior to hospitalization, patient could complete ADLs with min.  Patient will benefit from skilled intervention to decrease level of assist with basic self-care skills prior to discharge home with care partner.  Anticipate patient will require 24 hour supervision and moderate physical assestance and follow up home health.  OT - End of Session Activity Tolerance: Tolerates  30+ min activity with multiple rests Endurance Deficit: Yes OT  Assessment Rehab Potential: Good Barriers to Discharge: Decreased caregiver support OT Patient demonstrates impairments in the following area(s): Balance;Cognition;Endurance;Motor;Safety;Perception OT Basic ADL's Functional Problem(s): Grooming;Bathing;Dressing;Toileting OT Transfers Functional Problem(s): Toilet;Tub/Shower OT Plan OT Intensity: Minimum of 1-2 x/day, 45 to 90 minutes OT Frequency: 5 out of 7 days OT Duration/Estimated Length of Stay: 24-28 days OT Treatment/Interventions: Balance/vestibular training;Cognitive remediation/compensation;Discharge planning;Disease mangement/prevention;DME/adaptive equipment instruction;Functional mobility training;Neuromuscular re-education;Patient/family education;Psychosocial support;Self Care/advanced ADL retraining;Therapeutic Activities;Therapeutic Exercise;UE/LE Strength taining/ROM;UE/LE Coordination activities OT Self Feeding Anticipated Outcome(s): supervision/setup OT Basic Self-Care Anticipated Outcome(s): mod assist OT Toileting Anticipated Outcome(s): mod assist OT Bathroom Transfers Anticipated Outcome(s): mod assist OT Recommendation Patient destination: Home Follow Up Recommendations: Home health OT;24 hour supervision/assistance Equipment Recommended: To be determined   Skilled Therapeutic Intervention OT eval initiated, ADL assessment completed at sink.  Pt required +2 for all standing due to Rt lateral lean in standing and sitting.  Pt with decreased sequencing with transitional movements due to decreased awareness, sequencing, and weakness.  Pt required total assist for LB dressing and +2 to maintain standing while therapist pulled pants over hips.  Pt with decreased sitting balance with ability to identify in mirror but decreased ability to correct.  OT Evaluation Precautions/Restrictions  Precautions Precautions: Fall Restrictions Weight Bearing Restrictions: No Pain Pain Assessment Pain Assessment: No/denies  pain Pain Score: 0-No pain Patients Stated Pain Goal: 3 Multiple Pain Sites: No Home Living/Prior Functioning Home Living Available Help at Discharge: Neighbor;Family (Caregiver available PRN, plans to arrange 24hr care) Type of Home: House Home Access: Stairs to enter [employer] of Steps: 2 Entrance Stairs-Rails:  (Grab bars) Home Layout: One level  Lives With: Spouse IADL History Homemaking Responsibilities: No Prior Function Level of Independence: Needs assistance with gait;Needs assistance with ADLs;Needs assistance with homemaking  Able to Take Stairs?: Yes Vocation: Retired ADL  See FIM Vision/Perception  Vision - History Baseline Vision: Wears glasses all the time Patient Visual Report: No change from baseline Vision - Assessment Eye Alignment: Within Functional Limits Vision Assessment: Vision tested Ocular Range of Motion: Restricted on the right Tracking/Visual Pursuits: Requires cues, head turns, or add eye shifts to track Additional Comments: Decreased scanning to Rt Perception Perception: Impaired Inattention/Neglect:  (R inattention) Praxis Praxis: Impaired Praxis Impairment Details: Motor planning  Cognition Overall Cognitive Status: Impaired/Different from baseline Orientation Level: Oriented to person;Oriented to place Memory: Impaired Memory Impairment: Decreased long term memory;Decreased short term memory Awareness: Impaired Problem Solving: Impaired Sensation Sensation Light Touch: Appears Intact Stereognosis: Not tested Hot/Cold: Appears Intact Proprioception: Impaired by gross assessment Coordination Gross Motor Movements are Fluid and Coordinated: No Fine Motor Movements are Fluid and Coordinated: No Finger Nose Finger Test: Slightly decreased coordination BUE R>L Motor  Motor Motor: Hemiplegia Motor - Skilled Clinical Observations: R hemiparesis LE>UE, pushes/leans to R in sitting and standing, delayed motor  planning Mobility  Bed Mobility Bed Mobility: Supine to Sit;Sitting - Scoot to Edge of Bed Supine to Sit: 1: +1 Total assist Sitting - Scoot to Edge of Bed: 1: +1 Total assist Transfers Sit to Stand: 1: +2 Total assist  Trunk/Postural Assessment  Cervical Assessment Cervical Assessment: Exceptions to [facility] (forward head) Thoracic Assessment Thoracic Assessment: Exceptions to [facility] (Kyphotic, significant lean to R) Lumbar Assessment Lumbar Assessment: Exceptions to [facility] (posterior pelvic tilt) Postural Control Postural Control: Deficits on evaluation (lean and pushing to R)  Balance Static Sitting Balance Static Sitting - Balance Support: Feet supported Static Sitting - Level of  Assistance: 3: Mod assist Static Sitting - Comment/# of Minutes: Pt with significant lean to R in sitting, mod A to bring to midline Static Standing Balance Static Standing - Balance Support: Bilateral upper extremity supported Static Standing - Level of Assistance: 1: +2 Total assist Static Standing - Comment/# of Minutes: Difficulty with anterior weight shift/hip extension. Maintains trunk flexion in standing. Pushes to R Extremity/Trunk Assessment RUE Assessment RUE Assessment: Exceptions to [facility] (shoulder AROM 90 degrees, bicep/tricep grossly 3/5) LUE Assessment LUE Assessment: Within Functional Limits  FIM:  FIM - Grooming Grooming Steps: Wash, rinse, dry face Grooming: 2: Patient completes 1 of 4 or 2 of 5 steps FIM - Bathing Bathing Steps Patient Completed: Chest;Abdomen;Right upper leg;Left upper leg Bathing: 2: Max-Patient completes 3-4 [id number] parts or 25-49% FIM - Upper Body Dressing/Undressing Upper body dressing/undressing steps patient completed: Thread/unthread right sleeve of pullover shirt/dresss;Thread/unthread left sleeve of pullover shirt/dress Upper body dressing/undressing: 3: Total A (helper does all/Pt. < 25%) FIM - Lower Body Dressing/Undressing Lower body dressing/undressing: 1: Two  helpers FIM - BIT sales professionalTransfer: 1: Supine > Sit: Total A (helper does all/Pt. < 25%);1: Two helpers FIM - TRadio producerDevices: Bedside commode (UE support on sink) Toilet Transfers: 1-Two helpers   Refer to Care Plan for Long Term Goals  Recommendations for other services: None  Discharge Criteria: Patient will be discharged from OT if patient refuses treatment 3 consecutive times without medical reason, if treatment goals not met, if there is a change in medical status, if patient makes no progress towards goals or if patient is discharged from hospital.  The above assessment, treatment plan, treatment alternatives and goals were discussed and mutually agreed upon: by patient  HSimonne Come1/14/2015, 12:33 PM

## 2013-09-02 NOTE — Evaluation (Signed)
Physical Therapy Assessment and Plan  Patient Details  Name: Matthew Hodge MRN: [id number] Date of Birth: 1925-06-21  PT Diagnosis: Abnormal posture, Abnormality of gait, Cognitive deficits, Difficulty walking, Hemiparesis dominant, Impaired cognition and Muscle weakness Rehab Potential: Good ELOS: 26 days   Today's Date: 09/02/2013 Time: 0900-1005 Time Calculation (min): 65 min  Problem List:  Patient Active Problem List   Diagnosis Date Noted  . CVA (cerebral infarction) 09/01/2013  . Stroke 08/27/2013  . Alzheimer's disease   . Enlarged prostate with urinary retention 07/15/2012  . Chronic constipation 07/15/2012  . Hyperglycemia 07/15/2012  . Acute renal failure 07/15/2012  . Leukocytosis 07/15/2012  . Normocytic anemia 07/15/2012  . UTI (lower urinary tract infection) 07/15/2012    Past Medical History:  Past Medical History  Diagnosis Date  . Alzheimer's disease   . Skin cancer   . Chronic constipation    Past Surgical History:  Past Surgical History  Procedure Laterality Date  . Hand tendon surgery    . Cataract extraction, bilateral    . Skin cancer removal forehead    . Green light laser turp (transurethral resection of prostate  08/11/2012    Procedure: GREEN LIGHT LASER TURP (TRANSURETHRAL RESECTION OF PROSTATE;  Surgeon: Ailene Rud, MD;  Location: [facility];  Service: Urology;  Laterality: N/A;  Julianne Rice Laser of Prostate  . Insertion of suprapubic catheter  08/11/2012    Procedure: INSERTION OF SUPRAPUBIC CATHETER;  Surgeon: Ailene Rud, MD;  Location: [facility];  Service: Urology;  Laterality: N/A;  . Circumcision  08/11/2012    Procedure: CIRCUMCISION ADULT;  Surgeon: Ailene Rud, MD;  Location: [facility];  Service: Urology;  Laterality: N/A;  DORSAL SLIT CIRCUMCISION  . Cystoscopy  08/11/2012    Procedure: CYSTOSCOPY;  Surgeon: Ailene Rud, MD;  Location: [facility];  Service: Urology;  Laterality: N/A;    Assessment &  Plan Clinical Impression:Matthew Hodge is a 80 y.o. right-handed male was documented history of Alzheimer's disease. Patient lives with his wife and used a cane prior to admission. Patient on no scheduled medications prior to admission. Admitted 08/27/2013 with right side weakness and altered mental status. Cranial CT scan showed no acute abnormalities. Patient did receive TPA. MRI of the brain showed small patchy areas of acute infarct in the left parietal lobe. Possible 5 mm acute infarct right parietal white matter. MRA of the head with no large vessel occlusion. Echocardiogram with ejection fraction of 55% and normal systolic function. Carotid Dopplers with no ICA stenosis. EEG negative for seizure. Neurology services consulted maintained on aspirin for CVA prophylaxis. Patient is tolerating a regular diet. Physical and occupational therapy evaluations completed 08/28/2013 with recommendations for physical medicine rehabilitation consult to consider inpatient rehabilitation services.Patient was felt to be a good canidate for inpatient rehabilitation services was admitted for comprehensive rehabilitation program. Patient transferred to CIR on 09/01/2013 .   Patient currently requires total with mobility secondary to muscle weakness, impaired timing and sequencing, decreased coordination and decreased motor planning, decreased midline orientation and decreased attention to right, decreased awareness, decreased memory and delayed processing and decreased sitting balance, decreased standing balance, decreased postural control, hemiplegia and decreased balance strategies.  Prior to hospitalization, patient was supervision with mobility and lived with Spouse in a House home.  Home access is 2Stairs to enter.  Patient will benefit from skilled PT intervention to maximize safe functional mobility, minimize fall risk and decrease caregiver burden for planned discharge home with 24  hour assist.  Anticipate patient  will benefit from follow up [place] at discharge.  PT - End of Session Activity Tolerance: Tolerates 10 - 20 min activity with multiple rests Endurance Deficit: Yes PT Assessment Rehab Potential: Good Barriers to Discharge:  (Availability of assistance) PT Patient demonstrates impairments in the following area(s): Balance;Endurance;Motor;Perception;Safety PT Transfers Functional Problem(s): Bed Mobility;Bed to Chair;Car;Furniture PT Locomotion Functional Problem(s): Ambulation;Wheelchair Mobility;Stairs PT Plan PT Intensity: Minimum of 1-2 x/day ,45 to 90 minutes PT Frequency: 5 out of 7 days PT Duration Estimated Length of Stay: 26 days PT Treatment/Interventions: Ambulation/gait training;Balance/vestibular training;Cognitive remediation/compensation;Community reintegration;Discharge planning;DME/adaptive equipment instruction;Functional mobility training;Neuromuscular re-education;Patient/family education;Stair training;Therapeutic Activities;Therapeutic Exercise;UE/LE Strength taining/ROM;UE/LE Coordination activities;Visual/perceptual remediation/compensation;Wheelchair propulsion/positioning PT Transfers Anticipated Outcome(s): Min A PT Locomotion Anticipated Outcome(s): Mod A PT Recommendation Follow Up Recommendations: Home health PT;24 hour supervision/assistance Patient destination: Home Equipment Recommended: Wheelchair (measurements);Wheelchair cushion (measurements). Pt owns single point cane and rolling walker  Skilled Therapeutic Intervention Pt seen for physical therapy evaluation, details below. Pt's neighbor/caregiver, Matthew Hodge, present for session. Pt total A for bed mobility and total A+2 for transfers and ambulation. Pt demonstrated significant R lateral lean in sitting and pushing to R in standing and during transfers. Some R inattention/difficulty visually scanning to R noted. After ambulation, pt stated need to use bathroom. Sit<>stand transfer at sink to [facility] +2A. Pt did not  void or have BM, though stated he thought he had had a BM. +3A to complete hygiene (+2 for sit<>stand, 3rd person to perform peri hygiene). Pt left seated in w/c with caregiver and physician present.   PT Evaluation Precautions/Restrictions Precautions Precautions: Fall Restrictions Weight Bearing Restrictions: No General   Vital Signs  Pain Pain Assessment Pain Assessment: No/denies pain Pain Score: 0-No pain Patients Stated Pain Goal: 3 Multiple Pain Sites: No Home Living/Prior Functioning Home Living Available Help at Discharge: Neighbor;Family (Caregiver available PRN, plans to arrange 24hr care) Type of Home: House Home Access: Stairs to enter [employer] of Steps: 2 Entrance Stairs-Rails:  (Grab bars) Home Layout: One level  Lives With: Spouse Prior Function Level of Independence: Needs assistance with gait;Needs assistance with ADLs;Needs assistance with homemaking  Able to Take Stairs?: Yes Vocation: Retired Vision/Perception  Vision - History Baseline Vision: Wears glasses all the time Patient Visual Report: No change from baseline Perception Perception: Impaired Inattention/Neglect:  (R inattention) Praxis Praxis: Impaired Praxis Impairment Details: Motor planning  Cognition Overall Cognitive Status: Impaired/Different from baseline Orientation Level: Oriented to person;Oriented to place Memory: Impaired Memory Impairment: Decreased long term memory;Decreased short term memory Awareness: Impaired Problem Solving: Impaired Sensation Sensation Light Touch: Appears Intact Stereognosis: Not tested Hot/Cold: Appears Intact Proprioception: Impaired by gross assessment Coordination Gross Motor Movements are Fluid and Coordinated: No Fine Motor Movements are Fluid and Coordinated: No Finger Nose Finger Test: Slightly decreased coordination BUE R>L Motor  Motor Motor: Hemiplegia Motor - Skilled Clinical Observations: R hemiparesis LE>UE,  pushes/leans to R in sitting and standing, delayed motor planning  Mobility Bed Mobility Bed Mobility: Supine to Sit;Sitting - Scoot to Edge of Bed Supine to Sit: 1: +1 Total assist Sitting - Scoot to Edge of Bed: 1: +1 Total assist Transfers Transfers: Yes Sit to Stand: 1: +2 Total assist Stand Pivot Transfers: 1: +2 Total assist Locomotion  Ambulation Ambulation: Yes Ambulation/Gait Assistance: 1: +2 Total assist Ambulation Distance (Feet): 20 Feet Assistive device: 2 person hand held assist Ambulation/Gait Assistance Details: Verbal cues for technique;Verbal cues for sequencing;Verbal cues for gait pattern;Manual facilitation for weight shifting;Manual facilitation  for placement;Manual facilitation for weight bearing Gait Gait: Yes Gait Pattern: Impaired Gait Pattern: Decreased step length - right;Decreased step length - left;Decreased stance time - right;Decreased stance time - left;Decreased hip/knee flexion - right;Decreased hip/knee flexion - left;Decreased weight shift to right;Trunk flexed  Trunk/Postural Assessment  Cervical Assessment Cervical Assessment: Exceptions to [facility] (forward head) Thoracic Assessment Thoracic Assessment: Exceptions to WFL (Kyphotic, significant lean to R) Lumbar Assessment Lumbar Assessment: Exceptions to [facility] (posterior pelvic tilt) Postural Control Postural Control: Deficits on evaluation (lean and pushing to R)  Balance Static Sitting Balance Static Sitting - Balance Support: Feet supported Static Sitting - Level of Assistance: 3: Mod assist Static Sitting - Comment/# of Minutes: Pt with significant lean to R in sitting, mod A to bring to midline Static Standing Balance Static Standing - Balance Support: Bilateral upper extremity supported Static Standing - Level of Assistance: 1: +2 Total assist Static Standing - Comment/# of Minutes: Difficulty with anterior weight shift/hip extension. Maintains trunk flexion in standing. Pushes to  R Extremity Assessment  RUE Assessment RUE Assessment: Within Functional Limits LUE Assessment LUE Assessment: Within Functional Limits RLE Assessment RLE Assessment: Exceptions to [facility] RLE Strength RLE Overall Strength Comments: 2/5 hip flexion and knee extension, 4/5 knee flexion, 3+/5 dorsiflexion LLE Assessment LLE Assessment: Within Functional Limits  FIM:  FIM - Bed/Chair Transfer Bed/Chair Transfer: 1: Supine > Sit: Total A (helper does all/Pt. < 25%);1: Two helpers FIM - Locomotion: Wheelchair Locomotion: Wheelchair: 1: Total Assistance/staff pushes wheelchair (Pt<25%) FIM - Locomotion: Ambulation Locomotion: Ambulation Assistive Devices:  (+2 HHA) Ambulation/Gait Assistance: 1: +2 Total assist Locomotion: Ambulation: 1: Two helpers FIM - Locomotion: Stairs Locomotion: Stairs: 0: Activity did not occur   Refer to Care Plan for Long Term Goals  Recommendations for other services: None  Discharge Criteria: Patient will be discharged from PT if patient refuses treatment 3 consecutive times without medical reason, if treatment goals not met, if there is a change in medical status, if patient makes no progress towards goals or if patient is discharged from hospital.  The above assessment, treatment plan, treatment alternatives and goals were discussed and mutually agreed upon: by patient  [facility], [facility] 09/02/2013, 12:08 PM

## 2013-09-02 NOTE — Progress Notes (Signed)
Physical Therapy Session Note  Patient Details  Name: Matthew Hodge MRN: [id number] Date of Birth: [birth year]  Today's Date: 09/02/2013 Time: 5974-1638 Time Calculation (min): 39 min  Short Term Goals: Week 1:  PT Short Term Goal 1 (Week 1): Pt will perform supine<>sit with max A PT Short Term Goal 2 (Week 1): Pt will transfer bed<>w/c max A +1 PT Short Term Goal 3 (Week 1): Pt will maintain static sitting balance with min A  Skilled Therapeutic Interventions/Progress Updates:   Pt still up in w/c, just finished lunch.  Pt caregiver requesting that pt return to bed to rest until SLP evaluation.  Initiated transfer w/c > bed to L side with therapist cuing pt to reach with LUE as far across the bed as possible to facilitate L lateral weight shift and lean. Performed multiple squat scoots to L with max-total A with pt requiring extra time to initiate squat and facilitation for pushing through RLE to pivot to L.  Once seated on bed pt reported needing to have BM.  Transferred to R bed > BSC with squat pivot max-total A with over the back technique to maintain anterior and lateral leans and weight shift.  Pt able to come to partial stand x 2 with max-total A for clothing management and hygiene with caregiver.  Transferred BSC > bed to L with squat pivot max-total A.  Performed sit > supine with mod A and repositioned in bed with bridging and +2 A.    Therapy Documentation Precautions:  Precautions Precautions: Fall Restrictions Weight Bearing Restrictions: No Pain: Pain Assessment Pain Assessment: No/denies pain   See FIM for current functional status  Therapy/Group: Individual Therapy  [name] [facility] 09/02/2013, 2:09 PM

## 2013-09-02 NOTE — Evaluation (Signed)
Speech Language Pathology Assessment and Plan  Patient Details  Name: Matthew Hodge MRN: [id number] Date of Birth: 07/24/1925  SLP Diagnosis: Cognitive Impairments;Speech and Language deficits  Rehab Potential: Fair ELOS: 24-28 days    Today's Date: 09/02/2013 Time: 1500-1600 Time Calculation (min): 60 min  Problem List:  Patient Active Problem List   Diagnosis Date Noted  . CVA (cerebral infarction) 09/01/2013  . Stroke 08/27/2013  . Alzheimer's disease   . Enlarged prostate with urinary retention 07/15/2012  . Chronic constipation 07/15/2012  . Hyperglycemia 07/15/2012  . Acute renal failure 07/15/2012  . Leukocytosis 07/15/2012  . Normocytic anemia 07/15/2012  . UTI (lower urinary tract infection) 07/15/2012   Past Medical History:  Past Medical History  Diagnosis Date  . Alzheimer's disease   . Skin cancer   . Chronic constipation    Past Surgical History:  Past Surgical History  Procedure Laterality Date  . Hand tendon surgery    . Cataract extraction, bilateral    . Skin cancer removal forehead    . Green light laser turp (transurethral resection of prostate  08/11/2012    Procedure: GREEN LIGHT LASER TURP (TRANSURETHRAL RESECTION OF PROSTATE;  Surgeon: Ailene Rud, MD;  Location: [facility];  Service: Urology;  Laterality: N/A;  Julianne Rice Laser of Prostate  . Insertion of suprapubic catheter  08/11/2012    Procedure: INSERTION OF SUPRAPUBIC CATHETER;  Surgeon: Ailene Rud, MD;  Location: [facility];  Service: Urology;  Laterality: N/A;  . Circumcision  08/11/2012    Procedure: CIRCUMCISION ADULT;  Surgeon: Ailene Rud, MD;  Location: [facility];  Service: Urology;  Laterality: N/A;  DORSAL SLIT CIRCUMCISION  . Cystoscopy  08/11/2012    Procedure: CYSTOSCOPY;  Surgeon: Ailene Rud, MD;  Location: [facility];  Service: Urology;  Laterality: N/A;    Assessment / Plan / Recommendation Clinical Impression  Pt is an 79 y.o. right-handed  male with documented h/o Alzheimer's disease. Pt lives with his wife and used a cane PTA. Patient on no scheduled medications prior to admission. Admitted 08/27/13 with right side weakness and AMS. Cranial CT scan showed no acute abnormalities. Patient did receive TPA. MRI of the brain showed small patchy areas of acute infarct in the left parietal lobe. Possible 5 mm acute infarct right parietal white matter. MRA of the head with no large vessel occlusion. Echocardiogram with ejection fraction of 55% and normal systolic function. EEG negative for seizure. Pt is tolerating a regular diet. PT/OT evaluations completed 08/28/13 with recommendations for physical medicine rehabilitation consult to consider inpatient rehabilitation services. Patient was felt to be a good candidate for inpatient rehabilitation services was admitted for comprehensive rehabilitation program 1/13; SLP cognitive-linguistic evaluation completed 1/14. Pt presents with disorientation, delayed processing time, and impaired sustained attention, basic problem solving, ability to follow commands, and recall of information. Pt was easily frustrated and tearful when cognitively challenged, and will therefore require additional treatment focusing on differential diagnosis of cognitive-linguistic abilities in functional environments. Family reports that he is not at his cognitive baseline and that he has not been communicating as much. Therefore, pt will benefit from skilled SLP services to maximize cognitive function and functional communication prior to discharge.    SLP Assessment  Patient will need skilled [place] [facility] Services during CIR admission    Recommendations  Patient destination: Home Follow up Recommendations: Home Health SLP;24 hour supervision/assistance Equipment Recommended: None recommended by SLP    SLP Frequency 5 out of 7  days   SLP Treatment/Interventions Cognitive remediation/compensation;Cueing  hierarchy;Environmental controls;Functional tasks;Internal/external aids;Patient/family education;Speech/Language facilitation    Pain Pain Assessment Pain Assessment: No/denies pain Prior Functioning Cognitive/Linguistic Baseline: Baseline deficits Baseline deficit details: Diagnosis of Alzheimers Type of Home: House  Lives With: Spouse Available Help at Discharge: Neighbor;Family Vocation: Retired  Industrial/product designer Term Goals: Week 1: SLP Short Term Goal 1 (Week 1): Pt will sustain attention to functional task for 60 seconds with Max cues SLP Short Term Goal 2 (Week 1): Pt will use environmental clues to demonstrate orientation to time, place, and situation with Max cues SLP Short Term Goal 3 (Week 1): Pt will communicate basic wants/needs with Max cues SLP Short Term Goal 4 (Week 1): Pt will use basic problem solving to utilize call bell with Max cues  See FIM for current functional status Refer to Care Plan for Long Term Goals  Recommendations for other services: None  Discharge Criteria: Patient will be discharged from SLP if patient refuses treatment 3 consecutive times without medical reason, if treatment goals not met, if there is a change in medical status, if patient makes no progress towards goals or if patient is discharged from hospital.  The above assessment, treatment plan, treatment alternatives and goals were discussed and mutually agreed upon: by patient and by family   Germain Osgood, M.A. CCC-SLP [phone]   Germain Osgood 09/02/2013, 4:43 PM

## 2013-09-02 NOTE — Progress Notes (Signed)
Subjective/Complaints: 80 y.o. right-handed male was documented history of Alzheimer's disease. Patient lives with his wife and used a cane prior to admission. Patient on no scheduled medications prior to admission. Admitted 08/27/2013 with right side weakness and altered mental status. Cranial CT scan showed no acute abnormalities. Patient did receive TPA. MRI of the brain showed small patchy areas of acute infarct in the left parietal lobe. Possible 5 mm acute infarct right parietal white matter. MRA of the head with no large vessel occlusion. Echocardiogram with ejection fraction of 55% and normal systolic function.  Carotid Dopplers with no ICA stenosis. EEG negative for seizure    Review of Systems - incomplete secondary to cognitive issues  Objective: Vital Signs: Blood pressure 143/69, pulse 101, temperature 97.3 F (36.3 C), temperature source Oral, resp. rate 18, height 6' (1.829 m), weight 82.963 kg (182 lb 14.4 oz), SpO2 97.00%. No results found. Results for orders placed during the hospital encounter of 09/01/13 (from the past 72 hour(s))  CBC WITH DIFFERENTIAL     Status: Abnormal   Collection Time    09/02/13  6:20 AM      Result Value Range   WBC 6.9  4.0 - 10.5 K/uL   RBC 4.17 (*) 4.22 - 5.81 MIL/uL   Hemoglobin 13.1  13.0 - 17.0 g/dL   HCT 37.9 (*) 39.0 - 52.0 %   MCV 90.9  78.0 - 100.0 fL   MCH 31.4  26.0 - 34.0 pg   MCHC 34.6  30.0 - 36.0 g/dL   RDW 12.9  11.5 - 15.5 %   Platelets 284  150 - 400 K/uL   Neutrophils Relative % 67  43 - 77 %   Neutro Abs 4.7  1.7 - 7.7 K/uL   Lymphocytes Relative 13  12 - 46 %   Lymphs Abs 0.9  0.7 - 4.0 K/uL   Monocytes Relative 10  3 - 12 %   Monocytes Absolute 0.7  0.1 - 1.0 K/uL   Eosinophils Relative 9 (*) 0 - 5 %   Eosinophils Absolute 0.6  0.0 - 0.7 K/uL   Basophils Relative 0  0 - 1 %   Basophils Absolute 0.0  0.0 - 0.1 K/uL  COMPREHENSIVE METABOLIC PANEL     Status: Abnormal   Collection Time    09/02/13  6:20 AM       Result Value Range   Sodium 137  137 - 147 mEq/L   Potassium 4.2  3.7 - 5.3 mEq/L   Chloride 101  96 - 112 mEq/L   CO2 25  19 - 32 mEq/L   Glucose, Bld 114 (*) 70 - 99 mg/dL   BUN 21  6 - 23 mg/dL   Creatinine, Ser 1.04  0.50 - 1.35 mg/dL   Calcium 9.1  8.4 - 10.5 mg/dL   Total Protein 6.6  6.0 - 8.3 g/dL   Albumin 3.3 (*) 3.5 - 5.2 g/dL   AST 21  0 - 37 U/L   ALT 11  0 - 53 U/L   Alkaline Phosphatase 80  39 - 117 U/L   Total Bilirubin 0.3  0.3 - 1.2 mg/dL   GFR calc non Af Amer 63 (*) >90 mL/min   GFR calc Af Amer 73 (*) >90 mL/min   Comment: (NOTE)     The eGFR has been calculated using the CKD EPI equation.     This calculation has not been validated in all clinical situations.     eGFR's  persistently <90 mL/min signify possible Chronic Kidney     Disease.     HEENT: normal Cardio: RRR and no murmurs Resp: CTA B/L and unlabored GI: BS positive and ND Extremity:  Pulses positive and No Edema Skin:   Intact Neuro: Confused, Flat, Cranial Nerve II-XII normal, Abnormal Sensory cannot fully assess but grossly intact LT B UE and BLE, Abnormal Motor 4/5 Bilateral delt bi tri grip HF KE ADF and Abnormal FMC Ataxic/ dec FMC Musc/Skel:  Other bilateral shoulder contracture, bilateral hamstrings contracture Gen NAD   Assessment/Plan: 1. Functional deficits secondary to R MCA infarct superimposed on chronic deconditioning which require 3+ hours per day of interdisciplinary therapy in a comprehensive inpatient rehab setting. Physiatrist is providing close team supervision and 24 hour management of active medical problems listed below. Physiatrist and rehab team continue to assess barriers to discharge/monitor patient progress toward functional and medical goals. FIM:                   Comprehension Comprehension: 5-Follows basic conversation/direction: With no assist  Expression Expression: 5-Expresses complex 90% of the time/cues < 10% of the time  Social  Interaction Social Interaction: 6-Interacts appropriately with others with medication or extra time (anti-anxiety, antidepressant).  Problem Solving Problem Solving: 5-Solves basic problems: With no assist  Memory Memory: 6-More than reasonable amt of time  Medical Problem List and Plan:  1. Thrombotic Right MCA infarct  2. DVT Prophylaxis/Anticoagulation: SCDs.Monitor for any signs of DVT.  3. Pain Management: Tylenol as needed  4. Mood/dementia: baseline cognitive deficits prior to CVA. Perhaps some mild improvement just before CVA.  - Bed alarm for safety  -might benefit from trial of memory aid  5. Neuropsych: This patient is not capable of making decisions on his own behalf.   LOS (Days) 1 A FACE TO FACE EVALUATION WAS PERFORMED  Matthew Hodge E 09/02/2013, 11:14 AM

## 2013-09-02 NOTE — Patient Care Conference (Signed)
Inpatient RehabilitationTeam Conference and Plan of Care Update Date: 09/02/2013   Time: 11;05 AM    Patient Name: Matthew Hodge      Medical Record Number: [id number]  Date of Birth: 03-01-1925 Sex: Male         Room/Bed: 4W12C/4W12C-01 Payor Info: Payor: MEDICARE / Plan: MEDICARE PART A AND B / Product Type: *No Product type* /    Admitting Diagnosis: CVA  Admit Date/Time:  09/01/2013  5:10 PM Admission Comments: No comment available   Primary Diagnosis:  <principal problem not specified> Principal Problem: <principal problem not specified>  Patient Active Problem List   Diagnosis Date Noted  . CVA (cerebral infarction) 09/01/2013  . Stroke 08/27/2013  . Alzheimer's disease   . Enlarged prostate with urinary retention 07/15/2012  . Chronic constipation 07/15/2012  . Hyperglycemia 07/15/2012  . Acute renal failure 07/15/2012  . Leukocytosis 07/15/2012  . Normocytic anemia 07/15/2012  . UTI (lower urinary tract infection) 07/15/2012    Expected Discharge Date: Expected Discharge Date: 09/26/13  Team Members Present: Physician leading conference: Dr. Alysia Penna Social Worker Present: Ovidio Kin, LCSW Nurse Present: Nanine Means, RN PT Present: [name], PT;Emily Rinaldo Cloud, PT OT Present: Simonne Come, Starling Manns, Maryella Shivers, OT SLP Present: Gunnar Fusi, SLP PPS Coordinator present : Daiva Nakayama, RN, CRRN     Current Status/Progress Goal Weekly Team Focus  Medical   pt with decline PTA  maintain medical stability  initiate treatment   Bowel/Bladder   Continent of bowel LBM 09/01/13, Suprapubic cath in place  Continent of Bowel  Maintian patency of suprapubic cath   Swallow/Nutrition/ Hydration     regular diet        ADL's   total assist +2   min-mod assist overall  self-care retraining, sitting balance, transfers   Mobility   Total A +2  Min-mod A overall  bed mobility, sitting balance, transfers, gait   Communication   family  reports decreased verbal expression  Min  verbalizing wants/needs   Safety/Cognition/ Behavioral Observations  Max-Total A for orientation, sustained attention, basic problem solving  Min  orientaiton, use of external aids, sustained attention, basic problem solving   Pain   No c/o pain  Pain <3  assess for pain q shift and treat with prn meds as ordered   Skin   Bruised arms and elbow  No skin breakdown  Assess skin for breakdown q shift      *See Care Plan and progress notes for long and short-term goals.  Barriers to Discharge: Wife is unable to help    Possible Resolutions to Barriers:  caregiver training    Discharge Planning/Teaching Needs:    Home with wife and Gary-caregiver who will need to increase his hours, currently 4-5 hours a day.  Will discuss with son and pt's wife.     Team Discussion:  New eval-gary caregiver/neighbor here to attend therapies with pt.  Adjusting to unit.  Revisions to Treatment Plan:  New eval   Continued Need for Acute Rehabilitation Level of Care: The patient requires daily medical management by a physician with specialized training in physical medicine and rehabilitation for the following conditions: Daily direction of a multidisciplinary physical rehabilitation program to ensure safe treatment while eliciting the highest outcome that is of practical value to the patient.: Yes Daily medical management of patient stability for increased activity during participation in an intensive rehabilitation regime.: Yes Daily analysis of laboratory values and/or radiology reports with any subsequent need for  medication adjustment of medical intervention for : Neurological problems  Elease Hashimoto 09/04/2013, 8:33 AM

## 2013-09-03 ENCOUNTER — Inpatient Hospital Stay (HOSPITAL_COMMUNITY): Payer: Medicare Other

## 2013-09-03 ENCOUNTER — Encounter (HOSPITAL_COMMUNITY): Payer: Medicare Other | Admitting: Occupational Therapy

## 2013-09-03 MED ORDER — DONEPEZIL HCL 5 MG PO TABS
5.0000 mg | ORAL_TABLET | Freq: Every day | ORAL | Status: DC
  Administered 2013-09-03 – 2013-09-25 (×23): 5 mg via ORAL
  Filled 2013-09-03 (×25): qty 1

## 2013-09-03 NOTE — IPOC Note (Signed)
Overall Plan of Care [facility]) Patient Details Name: Matthew Hodge MRN: [id number] DOB: Dec 20, 1924  Admitting Diagnosis: CVA  Hospital Problems: Active Problems:   CVA (cerebral infarction)     Functional Problem List: Nursing Bladder;Bowel;Endurance;Medication Management;Nutrition;Pain;Perception;Safety;Skin Integrity  PT Balance;Endurance;Motor;Perception;Safety  OT Balance;Cognition;Endurance;Motor;Safety;Perception  SLP Cognition;Linguistic  TR         Basic ADL's: OT Grooming;Bathing;Dressing;Toileting     Advanced  ADL's: OT       Transfers: PT Bed Mobility;Bed to Chair;Car;Furniture  OT Toilet;Tub/Shower     Locomotion: PT Ambulation;Wheelchair Mobility;Stairs     Additional Impairments: OT    SLP Communication;Social Cognition expression Problem Solving;Memory;Attention;Awareness  TR      Anticipated Outcomes Item Anticipated Outcome  Self Feeding supervision/setup  Swallowing      Basic self-care  mod assist  Toileting  mod assist   Bathroom Transfers mod assist  Bowel/Bladder  continent of bowel  modified independent, min assist with suprapubic catheter  Transfers  Min A  Locomotion  Mod A  Communication  Min  Cognition  Min with basic cognition, Mod with external aids for recall  Pain  3 or less on scale of 1-10  Safety/Judgment  supervision   Therapy Plan: PT Intensity: Minimum of 1-2 x/day ,45 to 90 minutes PT Frequency: 5 out of 7 days PT Duration Estimated Length of Stay: 26 days OT Intensity: Minimum of 1-2 x/day, 45 to 90 minutes OT Frequency: 5 out of 7 days OT Duration/Estimated Length of Stay: 24-28 days SLP Intensity: Minumum of 1-2 x/day, 30 to 90 minutes SLP Frequency: 5 out of 7 days SLP Duration/Estimated Length of Stay: 24-28 days       Team Interventions: Nursing Interventions Patient/Family Education;Bladder Management;Bowel Management;Disease Management/Prevention;Pain Management;Medication Management;Skin  Care/Wound Management;Cognitive Remediation/Compensation;Psychosocial Support;Discharge Planning  PT interventions Ambulation/gait training;Balance/vestibular training;Cognitive remediation/compensation;Community reintegration;Discharge planning;DME/adaptive equipment instruction;Functional mobility training;Neuromuscular re-education;Patient/family education;Stair training;Therapeutic Activities;Therapeutic Exercise;UE/LE Strength taining/ROM;UE/LE Coordination activities;Visual/perceptual remediation/compensation;Wheelchair propulsion/positioning  OT Interventions Balance/vestibular training;Cognitive remediation/compensation;Discharge planning;Disease mangement/prevention;DME/adaptive equipment instruction;Functional mobility training;Neuromuscular re-education;Patient/family education;Psychosocial support;Self Care/advanced ADL retraining;Therapeutic Activities;Therapeutic Exercise;UE/LE Strength taining/ROM;UE/LE Coordination activities  SLP Interventions Cognitive remediation/compensation;Cueing hierarchy;Environmental controls;Functional tasks;Internal/external aids;Patient/family education;Speech/Language facilitation  TR Interventions    SW/CM Interventions Discharge Planning;Psychosocial Support;Patient/Family Education    Team Discharge Planning: Destination: PT-Home ,OT- Home , SLP-Home Projected Follow-up: PT-Home health PT;24 hour supervision/assistance, OT-  Home health OT;24 hour supervision/assistance, SLP-Home Health SLP;24 hour supervision/assistance Projected Equipment Needs: PT-Wheelchair (measurements);Wheelchair cushion (measurements), OT- To be determined, SLP-None recommended by SLP Equipment Details: PT- , OT-  Patient/family involved in discharge planning: PT- Patient;Family member/caregiver,  OT-Patient;Family member/caregiver, SLP-Patient;Family member/caregiver  MD ELOS: 12-14 days Medical Rehab Prognosis:  Fair Assessment: 80 y.o. right-handed male was documented  history of Alzheimer's disease. Patient lives with his wife and used a cane prior to admission. Patient on no scheduled medications prior to admission. Admitted 08/27/2013 with right side weakness and altered mental status. Cranial CT scan showed no acute abnormalities. Patient did receive TPA. MRI of the brain showed small patchy areas of acute infarct in the left parietal lobe. Possible 5 mm acute infarct right parietal white matter. MRA of the head with no large vessel occlusion. Echocardiogram with ejection fraction of 55% and normal systolic function.   Now requiring 24/7 Rehab RN,MD, as well as CIR level PT, OT and SLP.  Treatment team will focus on ADLs and mobility with goals set at min/mod A   See Team Conference Notes for weekly updates to the plan of care

## 2013-09-03 NOTE — Progress Notes (Signed)
I have read and agree with above treatment note.  Ileana Ladd, PT

## 2013-09-03 NOTE — Progress Notes (Signed)
Subjective/Complaints: 80 y.o. right-handed male was documented history of Alzheimer's disease. Patient lives with his wife and used a cane prior to admission. Patient on no scheduled medications prior to admission. Admitted 08/27/2013 with right side weakness and altered mental status. Cranial CT scan showed no acute abnormalities. Patient did receive TPA. MRI of the brain showed small patchy areas of acute infarct in the left parietal lobe. Possible 5 mm acute infarct right parietal white matter. MRA of the head with no large vessel occlusion. Echocardiogram with ejection fraction of 55% and normal systolic function.  Carotid Dopplers with no ICA stenosis. EEG negative for seizure  Pt alert and cooperative.  Oriented to [facility]. Not aware of stroke  Review of Systems - incomplete secondary to cognitive issues  Objective: Vital Signs: Blood pressure 143/66, pulse 77, temperature 97.9 F (36.6 C), temperature source Oral, resp. rate 18, height 6' (1.829 m), weight 82.963 kg (182 lb 14.4 oz), SpO2 97.00%. No results found. Results for orders placed during the hospital encounter of 09/01/13 (from the past 72 hour(s))  CBC WITH DIFFERENTIAL     Status: Abnormal   Collection Time    09/02/13  6:20 AM      Result Value Range   WBC 6.9  4.0 - 10.5 K/uL   RBC 4.17 (*) 4.22 - 5.81 MIL/uL   Hemoglobin 13.1  13.0 - 17.0 g/dL   HCT 37.9 (*) 39.0 - 52.0 %   MCV 90.9  78.0 - 100.0 fL   MCH 31.4  26.0 - 34.0 pg   MCHC 34.6  30.0 - 36.0 g/dL   RDW 12.9  11.5 - 15.5 %   Platelets 284  150 - 400 K/uL   Neutrophils Relative % 67  43 - 77 %   Neutro Abs 4.7  1.7 - 7.7 K/uL   Lymphocytes Relative 13  12 - 46 %   Lymphs Abs 0.9  0.7 - 4.0 K/uL   Monocytes Relative 10  3 - 12 %   Monocytes Absolute 0.7  0.1 - 1.0 K/uL   Eosinophils Relative 9 (*) 0 - 5 %   Eosinophils Absolute 0.6  0.0 - 0.7 K/uL   Basophils Relative 0  0 - 1 %   Basophils Absolute 0.0  0.0 - 0.1 K/uL  COMPREHENSIVE  METABOLIC PANEL     Status: Abnormal   Collection Time    09/02/13  6:20 AM      Result Value Range   Sodium 137  137 - 147 mEq/L   Potassium 4.2  3.7 - 5.3 mEq/L   Chloride 101  96 - 112 mEq/L   CO2 25  19 - 32 mEq/L   Glucose, Bld 114 (*) 70 - 99 mg/dL   BUN 21  6 - 23 mg/dL   Creatinine, Ser 1.04  0.50 - 1.35 mg/dL   Calcium 9.1  8.4 - 10.5 mg/dL   Total Protein 6.6  6.0 - 8.3 g/dL   Albumin 3.3 (*) 3.5 - 5.2 g/dL   AST 21  0 - 37 U/L   ALT 11  0 - 53 U/L   Alkaline Phosphatase 80  39 - 117 U/L   Total Bilirubin 0.3  0.3 - 1.2 mg/dL   GFR calc non Af Amer 63 (*) >90 mL/min   GFR calc Af Amer 73 (*) >90 mL/min   Comment: (NOTE)     The eGFR has been calculated using the CKD EPI equation.     This calculation has  not been validated in all clinical situations.     eGFR's persistently <90 mL/min signify possible Chronic Kidney     Disease.     HEENT: normal Cardio: RRR and no murmurs Resp: CTA B/L and unlabored GI: BS positive and ND Extremity:  Pulses positive and No Edema Skin:   Intact Neuro: Confused, Flat, Cranial Nerve II-XII normal, Abnormal Sensory cannot fully assess but grossly intact LT B UE and BLE, Abnormal Motor 4/5 Bilateral delt bi tri grip HF KE ADF and Abnormal FMC Ataxic/ dec FMC Musc/Skel:  Other bilateral shoulder contracture, bilateral hamstrings contracture Gen NAD   Assessment/Plan: 1. Functional deficits secondary to R MCA infarct superimposed on chronic deconditioning which require 3+ hours per day of interdisciplinary therapy in a comprehensive inpatient rehab setting. Physiatrist is providing close team supervision and 24 hour management of active medical problems listed below. Physiatrist and rehab team continue to assess barriers to discharge/monitor patient progress toward functional and medical goals. FIM: FIM - Bathing Bathing Steps Patient Completed: Chest;Abdomen Bathing: 1: Total-Patient completes 0-2 of 10 parts or less than 25% (HOB  up, rolling and sitting EOB)  FIM - Upper Body Dressing/Undressing Upper body dressing/undressing steps patient completed: Thread/unthread right sleeve of pullover shirt/dresss;Thread/unthread left sleeve of pullover shirt/dress Upper body dressing/undressing: 1: Total-Patient completed less than 25% of tasks FIM - Lower Body Dressing/Undressing Lower body dressing/undressing: 1: Two helpers (EOB and stand)     FIM - Radio producer Devices: Bedside commode (UE support on sink) Toilet Transfers: 1-Two helpers  FIM - Engineer, site: 1: Two helpers  FIM - Locomotion: Glass blower/designer: Wheelchair: 1: Total Assistance/staff pushes wheelchair (Pt<25%) FIM - Locomotion: Ambulation Locomotion: Ambulation Assistive Devices:  (+2 HHA) Ambulation/Gait Assistance: 1: +2 Total assist Locomotion: Ambulation: 0: Activity did not occur  Comprehension Comprehension Mode: Auditory Comprehension: 3-Understands basic 50 - 74% of the time/requires cueing 25 - 50%  of the time  Expression Expression Mode: Verbal Expression: 3-Expresses basic 50 - 74% of the time/requires cueing 25 - 50% of the time. Needs to repeat parts of sentences.  Social Interaction Social Interaction: 4-Interacts appropriately 75 - 89% of the time - Needs redirection for appropriate language or to initiate interaction.  Problem Solving Problem Solving: 2-Solves basic 25 - 49% of the time - needs direction more than half the time to initiate, plan or complete simple activities  Memory Memory: 1-Recognizes or recalls less than 25% of the time/requires cueing greater than 75% of the time  Medical Problem List and Plan:  1. Thrombotic Right MCA infarct  2. DVT Prophylaxis/Anticoagulation: SCDs.Monitor for any signs of DVT.  3. Pain Management: Tylenol as needed  4. Mood/dementia: baseline cognitive deficits prior to CVA. Perhaps some mild improvement just before CVA.   - Bed alarm for safety  -might benefit from trial of aricept  5. Neuropsych: This patient is not capable of making decisions on his own behalf.   LOS (Days) 2 A FACE TO FACE EVALUATION WAS PERFORMED  Hira Trent E 09/03/2013, 12:45 PM

## 2013-09-03 NOTE — Progress Notes (Signed)
Physical Therapy Session Note  Patient Details  Name: Matthew Hodge MRN: [id number] Date of Birth: [birth year]  Today's Date: 09/03/2013 Time: 0930-1030, 1540-0867 Time Calculation (min): 60 min, 45 min  Short Term Goals: Week 1:  PT Short Term Goal 1 (Week 1): Pt will perform supine<>sit with max A PT Short Term Goal 2 (Week 1): Pt will transfer bed<>w/c max A +1 PT Short Term Goal 3 (Week 1): Pt will maintain static sitting balance with min A  Skilled Therapeutic Interventions/Progress Updates:   AM session: Pt received seated in w/c with caregiver, [name], present. Pt performed squat pivot transfer w/c>mat +2A. Pt performed static sitting balance edge of mat x3 with min A with verbal, visual, and tactile cues to maintain midline. Pt demonstrated improved midline posture as he continued. Pt aware of R lean and able to correct with min A and mod-max verbal cues. Performed reaching activity with LUE and leaning down on L elbow X5 to encourage weight shift to L. Pt stated "I feel like I'm standing on my head" when leaning on L elbow. Pt performed sit<>stand x3 with +2A. First trial +2HHA and following trials with RW. Pt demonstrated improved anterior pelvic translation and some decrease in R lateral lean with RW vs HHA. Pt performed squat pivot transfer mat>w/c +2A. Pt returned to room, left seated in w/c with needs in reach and caregiver present.   PM session: Pt received seated in w/c finishing lunch. Transported pt in w/c to therapy gym. Pt +2A for sit>stand. Pt ambulated 30 ft x3 utilizing L rail with max A. Verbal cues for sequencing and increased step length. Manual facilitation for avoidance of R lateral lean, weight shifting, and activation of R hip abductors in stance. Initially provided blocking at R knee to prevent buckling but as pt continued to ambulate he demonstrated improved knee stability no longer requiring blocking. Increased step length, emergence of step through pattern, and  improved weight shift noted as pt continued. Returned pt to room, transferred w/c>bed squat pivot +2A and sit>supine +2A. Pt left in bed with needs in reach and caregiver present.   Therapy Documentation Precautions:  Precautions Precautions: Fall Restrictions Weight Bearing Restrictions: No Pain: Pain Assessment Pain Score: 0-No pain  See FIM for current functional status  Therapy/Group: Individual Therapy  [name], [place] 09/03/2013, 12:21 PM

## 2013-09-03 NOTE — Progress Notes (Signed)
Occupational Therapy Session Note  Patient Details  Name: Matthew Hodge MRN: [id number] Date of Birth: 01-06-1925  Today's Date: 09/03/2013 Time: 0800-0900 Time Calculation (min): 60 min  Short Term Goals: Week 1:  OT Short Term Goal 1 (Week 1): Pt will complete sit <> stand for LB bathing with max assist of one caregiver OT Short Term Goal 2 (Week 1): Pt will complete toilet transfer with max assist of one caregiver OT Short Term Goal 3 (Week 1): Pt will complete UB dressing with mod assist of one caregiver OT Short Term Goal 4 (Week 1): Pt will complete LB drssing with max assist of one caregiver  Skilled Therapeutic Interventions/Progress Updates:  Upon arrival, patient eating breakfast in bed with HOB up and caregiver, [name] at his bedside.  Engaged in self care retraining to include sponge bath, dressing and bed>w/c transfer.  Patient assisted with bathing his peri area with HOB up and bottom was bathed by this therapist with patient in sidelying.  Patient with max to total assist for bed mobility.  Bath completed sitting EOB as well as dressing.  Patient required max cues for midline orientation strategies secondary to patient with pushing to right which became significant with more fatigue.  While seated, patient was assist to rest on his left elbow several times before proceeding with self care tasks which helped him with midline orientation.  Pt required total assist for LB dressing and +2 to stand for  pants to be pulled over hips. Patient was max assist with squat pivot transfer traveling to his right.  Patient leaning left with left elbow resting on the bed prior to transfer therefore patient required less assist and was able to assist with transfer.  Caregiver held the chair for safety.  Therapy Documentation Precautions:  Precautions Precautions: Fall Restrictions Weight Bearing Restrictions: No Pain: Denies pain ADL: See FIM for current functional status  Therapy/Group:  Individual Therapy  Otillia Cordone 09/03/2013, 11:33 AM

## 2013-09-03 NOTE — Progress Notes (Signed)
Speech Language Pathology Daily Session Note  Patient Details  Name: Matthew Hodge MRN: [id number] Date of Birth: 03/17/1925  Today's Date: 09/03/2013 Time: 4235-3614 Time Calculation (min): 30 min  Short Term Goals: Week 1: SLP Short Term Goal 1 (Week 1): Pt will sustain attention to functional task for 60 seconds with Max cues SLP Short Term Goal 2 (Week 1): Pt will use environmental clues to demonstrate orientation to time, place, and situation with Max cues SLP Short Term Goal 3 (Week 1): Pt will communicate basic wants/needs with Max cues SLP Short Term Goal 4 (Week 1): Pt will use basic problem solving to utilize call bell with Max cues  Skilled Therapeutic Interventions: Skilled treatment focused on cognitive goals. SLP facilitated session with basic, functional cognitive tasks. Pt sorted silverware in kitchen into three categories, with adequate sustained attention observed across ~7 minutes. Pt required supervision level cueing with selective attention when auditory distractions were introduced. Pt required Max cues for categorical naming task as well as Mod cues for basic verbal sequencing. Overall, he was more engaged and conversant in therapy today, with verbal expression at the sentence level. Continue plan of care.   FIM:  Comprehension Comprehension Mode: Auditory Comprehension: 4-Understands basic 75 - 89% of the time/requires cueing 10 - 24% of the time Expression Expression Mode: Verbal Expression: 4-Expresses basic 75 - 89% of the time/requires cueing 10 - 24% of the time. Needs helper to occlude trach/needs to repeat words. Social Interaction Social Interaction: 4-Interacts appropriately 75 - 89% of the time - Needs redirection for appropriate language or to initiate interaction. Problem Solving Problem Solving: 2-Solves basic 25 - 49% of the time - needs direction more than half the time to initiate, plan or complete simple activities Memory Memory: 1-Recognizes  or recalls less than 25% of the time/requires cueing greater than 75% of the time  Pain  No/denies pain  Therapy/Group: Individual Therapy   Germain Osgood, M.A. CCC-SLP [phone]   Germain Osgood 09/03/2013, 4:42 PM

## 2013-09-04 ENCOUNTER — Inpatient Hospital Stay (HOSPITAL_COMMUNITY): Payer: Medicare Other | Admitting: Physical Therapy

## 2013-09-04 ENCOUNTER — Inpatient Hospital Stay (HOSPITAL_COMMUNITY): Payer: Medicare Other

## 2013-09-04 ENCOUNTER — Inpatient Hospital Stay (HOSPITAL_COMMUNITY): Payer: Medicare Other | Admitting: Occupational Therapy

## 2013-09-04 ENCOUNTER — Inpatient Hospital Stay (HOSPITAL_COMMUNITY): Payer: Medicare Other | Admitting: Speech Pathology

## 2013-09-04 DIAGNOSIS — R339 Retention of urine, unspecified: Secondary | ICD-10-CM

## 2013-09-04 DIAGNOSIS — F0390 Unspecified dementia without behavioral disturbance: Secondary | ICD-10-CM

## 2013-09-04 DIAGNOSIS — I6350 Cerebral infarction due to unspecified occlusion or stenosis of unspecified cerebral artery: Secondary | ICD-10-CM

## 2013-09-04 NOTE — Progress Notes (Signed)
Subjective/Complaints: 80 y.o. right-handed male was documented history of Alzheimer's disease. Patient lives with his wife and used a cane prior to admission. Patient on no scheduled medications prior to admission. Admitted 08/27/2013 with right side weakness and altered mental status. Cranial CT scan showed no acute abnormalities. Patient did receive TPA. MRI of the brain showed small patchy areas of acute infarct in the left parietal lobe. Possible 5 mm acute infarct right parietal white matter. MRA of the head with no large vessel occlusion. Echocardiogram with ejection fraction of 55% and normal systolic function.  Carotid Dopplers with no ICA stenosis. EEG negative for seizure  Oriented to self, hearing aides not working, caregiver is with pt  Review of Systems - incomplete secondary to cognitive issues  Objective: Vital Signs: Blood pressure 130/80, pulse 84, temperature 97.5 F (36.4 C), temperature source Oral, resp. rate 18, height 6' (1.829 m), weight 82.963 kg (182 lb 14.4 oz), SpO2 97.00%. No results found. Results for orders placed during the hospital encounter of 09/01/13 (from the past 72 hour(s))  CBC WITH DIFFERENTIAL     Status: Abnormal   Collection Time    09/02/13  6:20 AM      Result Value Range   WBC 6.9  4.0 - 10.5 K/uL   RBC 4.17 (*) 4.22 - 5.81 MIL/uL   Hemoglobin 13.1  13.0 - 17.0 g/dL   HCT 37.9 (*) 39.0 - 52.0 %   MCV 90.9  78.0 - 100.0 fL   MCH 31.4  26.0 - 34.0 pg   MCHC 34.6  30.0 - 36.0 g/dL   RDW 12.9  11.5 - 15.5 %   Platelets 284  150 - 400 K/uL   Neutrophils Relative % 67  43 - 77 %   Neutro Abs 4.7  1.7 - 7.7 K/uL   Lymphocytes Relative 13  12 - 46 %   Lymphs Abs 0.9  0.7 - 4.0 K/uL   Monocytes Relative 10  3 - 12 %   Monocytes Absolute 0.7  0.1 - 1.0 K/uL   Eosinophils Relative 9 (*) 0 - 5 %   Eosinophils Absolute 0.6  0.0 - 0.7 K/uL   Basophils Relative 0  0 - 1 %   Basophils Absolute 0.0  0.0 - 0.1 K/uL  COMPREHENSIVE METABOLIC PANEL      Status: Abnormal   Collection Time    09/02/13  6:20 AM      Result Value Range   Sodium 137  137 - 147 mEq/L   Potassium 4.2  3.7 - 5.3 mEq/L   Chloride 101  96 - 112 mEq/L   CO2 25  19 - 32 mEq/L   Glucose, Bld 114 (*) 70 - 99 mg/dL   BUN 21  6 - 23 mg/dL   Creatinine, Ser 1.04  0.50 - 1.35 mg/dL   Calcium 9.1  8.4 - 10.5 mg/dL   Total Protein 6.6  6.0 - 8.3 g/dL   Albumin 3.3 (*) 3.5 - 5.2 g/dL   AST 21  0 - 37 U/L   ALT 11  0 - 53 U/L   Alkaline Phosphatase 80  39 - 117 U/L   Total Bilirubin 0.3  0.3 - 1.2 mg/dL   GFR calc non Af Amer 63 (*) >90 mL/min   GFR calc Af Amer 73 (*) >90 mL/min   Comment: (NOTE)     The eGFR has been calculated using the CKD EPI equation.     This calculation has not been  validated in all clinical situations.     eGFR's persistently <90 mL/min signify possible Chronic Kidney     Disease.     HEENT: normal Cardio: RRR and no murmurs Resp: CTA B/L and unlabored GI: BS positive and ND Extremity:  Pulses positive and No Edema Skin:   Intact Neuro: Confused, Flat, Cranial Nerve II-XII normal, Abnormal Sensory cannot fully assess but grossly intact LT B UE and BLE, Abnormal Motor 4/5 Bilateral delt bi tri grip HF KE ADF and Abnormal FMC Ataxic/ dec FMC Musc/Skel:  Other bilateral shoulder contracture, bilateral hamstrings contracture Gen NAD   Assessment/Plan: 1. Functional deficits secondary to R MCA infarct superimposed on chronic deconditioning which require 3+ hours per day of interdisciplinary therapy in a comprehensive inpatient rehab setting. Physiatrist is providing close team supervision and 24 hour management of active medical problems listed below. Physiatrist and rehab team continue to assess barriers to discharge/monitor patient progress toward functional and medical goals. FIM: FIM - Bathing Bathing Steps Patient Completed: Chest;Abdomen Bathing: 1: Total-Patient completes 0-2 of 10 parts or less than 25% (HOB up, rolling and  sitting EOB)  FIM - Upper Body Dressing/Undressing Upper body dressing/undressing steps patient completed: Thread/unthread right sleeve of pullover shirt/dresss;Thread/unthread left sleeve of pullover shirt/dress Upper body dressing/undressing: 1: Total-Patient completed less than 25% of tasks FIM - Lower Body Dressing/Undressing Lower body dressing/undressing: 1: Two helpers (EOB and stand)     FIM - Radio producer Devices: Bedside commode (UE support on sink) Toilet Transfers: 1-Two helpers  FIM - IT sales professional Transfer: 1: Two helpers  FIM - Locomotion: Glass blower/designer: Wheelchair: 1: Total Assistance/staff pushes wheelchair (Pt<25%) FIM - Locomotion: Ambulation Locomotion: Ambulation Assistive Devices: Other (comment) (L hallway rail) Ambulation/Gait Assistance: 2: Max assist Locomotion: Ambulation: 1: Travels less than 50 ft with maximal assistance (Pt: 25 - 49%)  Comprehension Comprehension Mode: Auditory Comprehension: 4-Understands basic 75 - 89% of the time/requires cueing 10 - 24% of the time  Expression Expression Mode: Verbal Expression: 4-Expresses basic 75 - 89% of the time/requires cueing 10 - 24% of the time. Needs helper to occlude trach/needs to repeat words.  Social Interaction Social Interaction: 4-Interacts appropriately 75 - 89% of the time - Needs redirection for appropriate language or to initiate interaction.  Problem Solving Problem Solving: 2-Solves basic 25 - 49% of the time - needs direction more than half the time to initiate, plan or complete simple activities  Memory Memory: 1-Recognizes or recalls less than 25% of the time/requires cueing greater than 75% of the time  Medical Problem List and Plan:  1. Thrombotic Right MCA infarct  2. DVT Prophylaxis/Anticoagulation: SCDs.Monitor for any signs of DVT.  3. Pain Management: Tylenol as needed  4. Mood/dementia: baseline cognitive deficits  prior to CVA. Perhaps some mild improvement just before CVA.  - Bed alarm for safety  - trial of aricept  5. Neuropsych: This patient is not capable of making decisions on his own behalf.   LOS (Days) 3 A FACE TO FACE EVALUATION WAS PERFORMED  Ernie Sagrero E 09/04/2013, 8:15 AM

## 2013-09-04 NOTE — Progress Notes (Signed)
Speech Language Pathology Daily Session Note  Patient Details  Name: Matthew Hodge MRN: [id number] Date of Birth: Mar 31, 1925  Today's Date: 09/04/2013 Time: 5638-9373 Time Calculation (min): 30 min  Short Term Goals: Week 1: SLP Short Term Goal 1 (Week 1): Pt will sustain attention to functional task for 60 seconds with Max cues SLP Short Term Goal 2 (Week 1): Pt will use environmental clues to demonstrate orientation to time, place, and situation with Max cues SLP Short Term Goal 3 (Week 1): Pt will communicate basic wants/needs with Max cues SLP Short Term Goal 4 (Week 1): Pt will use basic problem solving to utilize call bell with Max cues  Skilled Therapeutic Interventions: Skilled treatment session focused on addressing cognitive goals. SLP facilitated session with discussion regarding tasks patient performed at home PTA with caregiver present; patient accurately answered 50% of questions.  SLP also facilitated session with set-up assist for snack; patient was able to self-feed with initial cue for initiation of task and then increased time to complete in an environment with mild distractions. Continue plan of care.   FIM:  Comprehension Comprehension Mode: Auditory Comprehension: 3-Understands basic 50 - 74% of the time/requires cueing 25 - 50%  of the time Expression Expression Mode: Verbal Expression: 3-Expresses basic 50 - 74% of the time/requires cueing 25 - 50% of the time. Needs to repeat parts of sentences. Social Interaction Social Interaction: 3-Interacts appropriately 50 - 74% of the time - May be physically or verbally inappropriate. Problem Solving Problem Solving: 2-Solves basic 25 - 49% of the time - needs direction more than half the time to initiate, plan or complete simple activities Memory Memory: 1-Recognizes or recalls less than 25% of the time/requires cueing greater than 75% of the time FIM - Eating Eating Activity: 5: Set-up assist for open  containers  Pain Pain Assessment Pain Assessment: No/denies pain  Therapy/Group: Individual Therapy  Carmelia Roller., CCC-SLP [phone]  [place] 09/04/2013, 3:37 PM

## 2013-09-04 NOTE — Progress Notes (Signed)
Social Work Elease Hashimoto, LCSW Social Worker Signed  Patient Care Conference Service date: 09/02/2013 3:08 PM  Inpatient RehabilitationTeam Conference and Plan of Care Update Date: 09/02/2013   Time: 11;05 AM     Patient Name: Matthew Hodge       Medical Record Number: [id number]   Date of Birth: 07/11/1925 Sex: Male         Room/Bed: 4W12C/4W12C-01 Payor Info: Payor: MEDICARE / Plan: MEDICARE PART A AND B / Product Type: *No Product type* /   Admitting Diagnosis: CVA   Admit Date/Time:  09/01/2013  5:10 PM Admission Comments: No comment available   Primary Diagnosis:  <principal problem not specified> Principal Problem: <principal problem not specified>    Patient Active Problem List     Diagnosis  Date Noted   .  CVA (cerebral infarction)  09/01/2013   .  Stroke  08/27/2013   .  Alzheimer's disease     .  Enlarged prostate with urinary retention  07/15/2012   .  Chronic constipation  07/15/2012   .  Hyperglycemia  07/15/2012   .  Acute renal failure  07/15/2012   .  Leukocytosis  07/15/2012   .  Normocytic anemia  07/15/2012   .  UTI (lower urinary tract infection)  07/15/2012     Expected Discharge Date: Expected Discharge Date: 09/26/13  Team Members Present: Physician leading conference: Dr. Alysia Penna Social Worker Present: Ovidio Kin, LCSW Nurse Present: Nanine Means, RN PT Present: [name], PT;Emily Rinaldo Cloud, PT OT Present: Simonne Come, Starling Manns, Maryella Shivers, OT SLP Present: Gunnar Fusi, SLP PPS Coordinator present : Daiva Nakayama, RN, CRRN        Current Status/Progress  Goal  Weekly Team Focus   Medical     pt with decline PTA  maintain medical stability  initiate treatment   Bowel/Bladder     Continent of bowel LBM 09/01/13, Suprapubic cath in place  Continent of Bowel  Maintian patency of suprapubic cath   Swallow/Nutrition/ Hydration     regular diet       ADL's     total assist +2   min-mod assist overall  self-care  retraining, sitting balance, transfers   Mobility     Total A +2  Min-mod A overall  bed mobility, sitting balance, transfers, gait   Communication     family reports decreased verbal expression  Min  verbalizing wants/needs   Safety/Cognition/ Behavioral Observations    Max-Total A for orientation, sustained attention, basic problem solving  Min  orientaiton, use of external aids, sustained attention, basic problem solving   Pain     No c/o pain  Pain <3  assess for pain q shift and treat with prn meds as ordered   Skin     Bruised arms and elbow  No skin breakdown  Assess skin for breakdown q shift     *See Care Plan and progress notes for long and short-term goals.    Barriers to Discharge:  Wife is unable to help      Possible Resolutions to Barriers:    caregiver training      Discharge Planning/Teaching Needs:    Home with wife and Gary-caregiver who will need to increase his hours, currently 4-5 hours a day.  Will discuss with son and pt's wife.    Team Discussion:    New eval-gary caregiver/neighbor here to attend therapies with pt.  Adjusting to unit.   Revisions to Treatment Plan:  New eval    Continued Need for Acute Rehabilitation Level of Care: The patient requires daily medical management by a physician with specialized training in physical medicine and rehabilitation for the following conditions: Daily direction of a multidisciplinary physical rehabilitation program to ensure safe treatment while eliciting the highest outcome that is of practical value to the patient.: Yes Daily medical management of patient stability for increased activity during participation in an intensive rehabilitation regime.: Yes Daily analysis of laboratory values and/or radiology reports with any subsequent need for medication adjustment of medical intervention for : Neurological problems  Elease Hashimoto 09/04/2013, 8:33 AM          Patient ID: Matthew Hodge, male   DOB:  February 19, 1925, [birth year], 78 y.o.   MRN: [id number]

## 2013-09-04 NOTE — Progress Notes (Signed)
Physical Therapy Session Note  Patient Details  Name: Matthew Hodge MRN: [id number] Date of Birth: [birth year]  Today's Date: 09/04/2013 Time: 6067-7034 Time Calculation (min): 25 min  Skilled Therapeutic Interventions/Progress Updates:  1:1. Pt received sitting in w/c, ready for therapy. Focus this session on problem solving and maintaining midline position in standing. Pt w/ good tolerance to standing in standing frame x52min while playing two rounds of tic-tac-toe. Pt demonstrating good problem solving during game w/out cueing as well as initiating weight shift to L side to access tic-tac-toe board on L side w/ mod verbal/tactile cues. Max A x1person for squat pivot t/f w/c>bed and sit>sup at end of session. Pt semi-reclined in bed w/ all needs in reach and bed alarm on.   Therapy Documentation Precautions:  Precautions Precautions: Fall Restrictions Weight Bearing Restrictions: No Vital Signs: Therapy Vitals Temp: 97.6 F (36.4 C) Temp src: Oral Pulse Rate: 93 Resp: 16 BP: 144/72 mmHg Patient Position, if appropriate: Lying Oxygen Therapy SpO2: 99 % O2 Device: None (Room air) Pain: Pain Assessment Pain Assessment: No/denies pain  See FIM for current functional status  Therapy/Group: Individual Therapy  Gilmore Laroche 09/04/2013, 4:35 PM

## 2013-09-04 NOTE — Progress Notes (Signed)
Occupational Therapy Session Note  Patient Details  Name: Matthew Hodge MRN: [id number] Date of Birth: April 20, 1925  Today's Date: 09/04/2013 Time: 0830-0920 and 1300-1330 Time Calculation (min): 50 min and 30 min  Short Term Goals: Week 1:  OT Short Term Goal 1 (Week 1): Pt will complete sit <> stand for LB bathing with max assist of one caregiver OT Short Term Goal 2 (Week 1): Pt will complete toilet transfer with max assist of one caregiver OT Short Term Goal 3 (Week 1): Pt will complete UB dressing with mod assist of one caregiver OT Short Term Goal 4 (Week 1): Pt will complete LB drssing with max assist of one caregiver  Skilled Therapeutic Interventions/Progress Updates:    1) Pt seen for ADL retraining with focus on bed mobility, midline sitting balance, and increased participation in self-care tasks of bathing and dressing.  Pt received eating breakfast in bed with HOB up and caregiver, [name] at his bedside. Engaged in bathing periarea with HOB up and bottom with patient in sidelying. Patient with max to total assist for bed mobility and max cues for sequencing and hand placement to assist with rolling. Bath completed sitting EOB as well as dressing. Patient required max cues for midline orientation strategies secondary to patient with pushing to right which became significant with more fatigue.  Tactile cues provided at lower back to promote awareness of posterior lean.  Pt with increased ability to correct balance this session with only verbal and tactile cues.  Pt required total assist for LB dressing and +2 to stand for pants to be pulled over hips. Max assist squat pivot transfer when going to his right.  Pt left in w/c with caregiver present to finish breakfast.  2) Pt seen for 1:1 OT with focus on static sitting balance and obtaining midline during reaching activity.  Performed squat pivot w/c to therapy mat with max-total assist with caregiver stabilizing w/c.  Engaged in reaching  task with focus on re-obtaining midline during reaching activity.  Pt with decreased comprehension of task, utilized mirroring to promote understanding. Rt lean more prominent as pt fatigued. Weight bearing through Lt elbow to decrease pushing and Rt lean.  Pt left supine with a wedge behind back to wait for PT session with caregiver present.  Therapy Documentation Precautions:  Precautions Precautions: Fall Restrictions Weight Bearing Restrictions: No Pain: Pain Assessment Pain Assessment: No/denies pain  See FIM for current functional status  Therapy/Group: Individual Therapy  Simonne Come 09/04/2013, 9:49 AM

## 2013-09-04 NOTE — Plan of Care (Signed)
Problem: RH BLADDER ELIMINATION Goal: RH STG MANAGE BLADDER WITH ASSISTANCE STG Manage Bladder With maxx Assistance  Outcome: Not Progressing Pt has supra-pubic cathteter that staff provides care for.Pt has h/o Alzheimers.

## 2013-09-04 NOTE — Progress Notes (Signed)
Physical Therapy Session Note  Patient Details  Name: Matthew Hodge MRN: [id number] Date of Birth: 15-Oct-1924  Today's Date: 09/04/2013 Time: 3762-8315 and 1761-6073 Time Calculation (min): 42 min and 29 min  Short Term Goals: Week 1:  PT Short Term Goal 1 (Week 1): Pt will perform supine<>sit with max A PT Short Term Goal 2 (Week 1): Pt will transfer bed<>w/c max A +1 PT Short Term Goal 3 (Week 1): Pt will maintain static sitting balance with min A  Skilled Therapeutic Interventions/Progress Updates:   Pt already up in w/c upon therapist arrival.  Pt noted to be sitting with trunk more in midline today in w/c with decreased lateral lean/pushing to R.  Transferred to gym in w/c total A.  Performed sit > stand x 2 from w/c with total A; first sit > stand unable to begin stand pivot transfer with UE support on RW secondary to pt in significant amount of trunk and LE flexion, R lateropulsion and retropulsion and inability to laterally or forward  Weight shift.  During second sit > stand pt able to come to more upright position with extra time and assistance and performed stand pivot to mat +2 A for weight shifting and pivoting.  Performed NMR; see below for details.  Weight shift training with poor carry over to stand pivot transfer back to w/c at end of session secondary to pt anxiety and fatigue.  Continued to require +2 A to stand from mat and perform stand pivot back to w/c with assistance for weight shift to L in order to advance RLE and total verbal cues for sequencing.  Once in w/c returned to room with caregiver present.   PM session: Pt supine on mat following OT session.  Performed rolling to R side and performed passive and active pelvic anterior and posterior rotation for increased trunk ROM and dissociation for mobility.  Pt with significantly limited ROM for anterior rotation but pt better able to initiate anterior rotation after multiple repetitions.  Performed supine > sit with mod A  overall.  Pt agreeable to attempting stairs.  Performed stair negotiation up/down 3 stairs forwards to ascend with step over step sequence and retro to descend with step to sequence.  Pt required +2 for safety and to facilitate lateral and forwards weight shifting, verbal cues for sequencing and assistance for stabilization of RLE into extension during stance.  Pt returned to sitting on mat.  Transferred mat > w/c to R with squat pivot mod-max A of one person.  Returned to room to rest before SLP session.    Therapy Documentation Precautions:  Precautions Precautions: Fall Restrictions Weight Bearing Restrictions: No Pain: Pain Assessment Pain Assessment: No/denies pain Other Treatments: Treatments Neuromuscular Facilitation: Right;Left;Lower Extremity;Activity to increase motor control;Activity to increase timing and sequencing;Activity to increase grading;Activity to increase sustained activation;Activity to increase lateral weight shifting;Activity to increase anterior-posterior weight shifting beginning with seated reaching activity with focus on pt performing reaching down, forwards and to the L for horseshoe and then reaching up, forwards and to the L to place horse shoe on tall target to facilitate forward and lateral weight shifting and increased extension through RLE to come to squat position.  During attempts to come to squat pt would experience increased trunk extension and would place LLE in extended and ABD position causing pt to be unable to reach up to tall target.  Changed to standing position with +2 A with pt placing 5-6 horseshoes on tall target up, forwards and  to the L with extra time and encouragement to complete task secondary to fear; after first repetition pt demonstrated improved extension through RLE, improved forward and L lateral weight shift, decreased pushing/trunk extension through LLE but after seated rest break pt unable to repeat.  Returned to w/c.    See FIM for  current functional status  Therapy/Group: Individual Therapy  [name] [facility] 09/04/2013, 12:18 PM

## 2013-09-05 ENCOUNTER — Inpatient Hospital Stay (HOSPITAL_COMMUNITY): Payer: Medicare Other

## 2013-09-05 NOTE — Plan of Care (Signed)
Problem: RH BLADDER ELIMINATION Goal: RH STG MANAGE BLADDER WITH ASSISTANCE STG Manage Bladder With maxx Assistance  Outcome: Not Progressing Staff provides care for catheter

## 2013-09-05 NOTE — Progress Notes (Signed)
Subjective/Complaints: 80 y.o. right-handed male was documented history of Alzheimer's disease. Patient lives with his wife and used a cane prior to admission. Patient on no scheduled medications prior to admission. Admitted 08/27/2013 with right side weakness and altered mental status. Cranial CT scan showed no acute abnormalities. Patient did receive TPA. MRI of the brain showed small patchy areas of acute infarct in the left parietal lobe. Possible 5 mm acute infarct right parietal white matter. MRA of the head with no large vessel occlusion. Echocardiogram with ejection fraction of 55% and normal systolic function.  Carotid Dopplers with no ICA stenosis. EEG negative for seizure  Oriented to self, hearing aides now working, caregiver is with pt, pt can now insert hearing aides  Review of Systems - incomplete secondary to cognitive issues  Objective: Vital Signs: Blood pressure 147/75, pulse 75, temperature 97.3 F (36.3 C), temperature source Oral, resp. rate 18, height 6' (1.829 m), weight 82.963 kg (182 lb 14.4 oz), SpO2 96.00%. No results found. No results found for this or any previous visit (from the past 72 hour(s)).   HEENT: normal Cardio: RRR and no murmurs Resp: CTA B/L and unlabored GI: BS positive and ND Extremity:  Pulses positive and No Edema Skin:   Intact Neuro: Confused, Flat, Cranial Nerve II-XII normal, Abnormal Sensory cannot fully assess but grossly intact LT B UE and BLE, Abnormal Motor 4/5 Bilateral delt bi tri grip HF KE ADF and Abnormal FMC Ataxic/ dec FMC Musc/Skel:  Other bilateral shoulder contracture, bilateral hamstrings contracture Gen NAD   Assessment/Plan: 1. Functional deficits secondary to R MCA infarct superimposed on chronic deconditioning which require 3+ hours per day of interdisciplinary therapy in a comprehensive inpatient rehab setting. Physiatrist is providing close team supervision and 24 hour management of active medical problems listed  below. Physiatrist and rehab team continue to assess barriers to discharge/monitor patient progress toward functional and medical goals. FIM: FIM - Bathing Bathing Steps Patient Completed: Chest;Abdomen;Right upper leg;Left upper leg Bathing: 2: Max-Patient completes 3-4 22f 10 parts or 25-49%  FIM - Upper Body Dressing/Undressing Upper body dressing/undressing steps patient completed: Thread/unthread right sleeve of pullover shirt/dresss;Thread/unthread left sleeve of pullover shirt/dress Upper body dressing/undressing: 2: Max-Patient completed 25-49% of tasks FIM - Lower Body Dressing/Undressing Lower body dressing/undressing: 1: Two helpers     FIM - Radio producer Devices: Bedside commode (UE support on sink) Toilet Transfers: 1-Two helpers  FIM - Control and instrumentation engineer Devices: Copy: 1: Two helpers  FIM - Locomotion: Glass blower/designer: Wheelchair: 1: Total Assistance/staff pushes wheelchair (Pt<25%) FIM - Locomotion: Ambulation Locomotion: Ambulation Assistive Devices: Other (comment) (L hallway rail) Ambulation/Gait Assistance: 2: Max assist Locomotion: Ambulation: 0: Activity did not occur  Comprehension Comprehension Mode: Auditory Comprehension: 3-Understands basic 50 - 74% of the time/requires cueing 25 - 50%  of the time  Expression Expression Mode: Verbal Expression: 3-Expresses basic 50 - 74% of the time/requires cueing 25 - 50% of the time. Needs to repeat parts of sentences.  Social Interaction Social Interaction: 3-Interacts appropriately 50 - 74% of the time - May be physically or verbally inappropriate.  Problem Solving Problem Solving: 2-Solves basic 25 - 49% of the time - needs direction more than half the time to initiate, plan or complete simple activities  Memory Memory: 1-Recognizes or recalls less than 25% of the time/requires cueing greater than 75% of the  time  Medical Problem List and Plan:  1. Thrombotic Right MCA infarct  2. DVT  Prophylaxis/Anticoagulation: SCDs.Monitor for any signs of DVT.  3. Pain Management: Tylenol as needed  4. Mood/dementia: baseline cognitive deficits prior to CVA. Perhaps some mild improvement just before CVA.  - Bed alarm for safety  - trial of aricept  5. Neuropsych: This patient is not capable of making decisions on his own behalf.   LOS (Days) 4 A FACE TO FACE EVALUATION WAS PERFORMED  Mariam Helbert E 09/05/2013, 9:36 AM

## 2013-09-05 NOTE — Progress Notes (Signed)
Speech Language Pathology Daily Session Note  Patient Details  Name: Matthew Hodge MRN: [id number] Date of Birth: 05-Dec-1924  Today's Date: 09/05/2013 Time: 1300-1355 Time Calculation (min): 55 min  Short Term Goals: Week 1: SLP Short Term Goal 1 (Week 1): Pt will sustain attention to functional task for 60 seconds with Max cues SLP Short Term Goal 2 (Week 1): Pt will use environmental clues to demonstrate orientation to time, place, and situation with Max cues SLP Short Term Goal 3 (Week 1): Pt will communicate basic wants/needs with Max cues SLP Short Term Goal 4 (Week 1): Pt will use basic problem solving to utilize call bell with Max cues  Skilled Therapeutic Interventions: Skilled treatment focused on cognitive goals. SLP facilitated session with familiar functional cognitive task. Pt worked with familiar tools for ~30-35 minutes with Min cues for redirection. Pt required Mod cues for working memory as he compared/contrasted different tools appropriately. He demonstrated adequate basic problem solving with Min cues. Upon completion of task, he sorted tools into three categories with Min cues for initiation of task as well as for redirection to task. Pt demonstrated effective verbal expression at the conversational level throughout session. Continue plan of care.     FIM:  Comprehension Comprehension Mode: Auditory Comprehension: 3-Understands basic 50 - 74% of the time/requires cueing 25 - 50%  of the time Expression Expression Mode: Verbal Expression: 3-Expresses basic 50 - 74% of the time/requires cueing 25 - 50% of the time. Needs to repeat parts of sentences. Social Interaction Social Interaction: 4-Interacts appropriately 75 - 89% of the time - Needs redirection for appropriate language or to initiate interaction. Problem Solving Problem Solving: 4-Solves basic 75 - 89% of the time/requires cueing 10 - 24% of the time Memory Memory: 1-Recognizes or recalls less than 25% of  the time/requires cueing greater than 75% of the time FIM - Eating Eating Activity: 5: Set-up assist for cut food  Pain Pain Assessment Pain Assessment: No/denies pain  Therapy/Group: Individual Therapy   Germain Osgood, M.A. CCC-SLP [phone]   Germain Osgood 09/05/2013, 3:49 PM

## 2013-09-06 ENCOUNTER — Inpatient Hospital Stay (HOSPITAL_COMMUNITY): Payer: Medicare Other | Admitting: Physical Therapy

## 2013-09-06 ENCOUNTER — Inpatient Hospital Stay (HOSPITAL_COMMUNITY): Payer: Medicare Other | Admitting: Occupational Therapy

## 2013-09-06 ENCOUNTER — Ambulatory Visit (HOSPITAL_COMMUNITY): Payer: Medicare Other | Admitting: *Deleted

## 2013-09-06 NOTE — Progress Notes (Signed)
Physical Therapy Session Note  Patient Details  Name: Matthew Hodge MRN: [id number] Date of Birth: 1924/11/26  Today's Date: 09/06/2013 Time: 1300-1350 Time Calculation (min): 50 min  Short Term Goals: Week 1:  PT Short Term Goal 1 (Week 1): Pt will perform supine<>sit with max A PT Short Term Goal 2 (Week 1): Pt will transfer bed<>w/c max A +1 PT Short Term Goal 3 (Week 1): Pt will maintain static sitting balance with min A  Skilled Therapeutic Interventions/Progress Updates:  Pt was seen bedside in the pm. Pt transported to rehab gym. Pt performed multiple transfers in parallel bars with mod to max A. Pt tolerated standing about 1 to 2 minutes each time. While standing treatment focused on weight shifting and midline standing position. Pt required verbal cues and physical assist to obtain. Performed B LE exercises seated in w/c, AROM L LE and AAROM R LE. Pt returned to room. Pt transferred w/c to edge of bed to L side with max A, second person stand by for safety. Pt transferred edge of bed to supine with total A.   Therapy Documentation Precautions:  Precautions Precautions: Fall Restrictions Weight Bearing Restrictions: No General: Amount of Missed PT Time (min): 10 Minutes Missed Time Reason: Patient fatigue  Pain: No c/o pain.   See FIM for current functional status  Therapy/Group: Individual Therapy  Dub Amis 09/06/2013, 3:30 PM

## 2013-09-06 NOTE — Progress Notes (Signed)
Physical Therapy Session Note  Patient Details  Name: Matthew Hodge MRN: [id number] Date of Birth: [birth year]  Today's Date: 09/06/2013 Time: 8453-6468 Time Calculation (min): 57 min  Short Term Goals: Week 1:  PT Short Term Goal 1 (Week 1): Pt will perform supine<>sit with max A PT Short Term Goal 2 (Week 1): Pt will transfer bed<>w/c max A +1 PT Short Term Goal 3 (Week 1): Pt will maintain static sitting balance with min A  Skilled Therapeutic Interventions/Progress Updates:  Pt was seen bedside in the am. Pt transferred supine to edge of bed with max to total A. Pt transferred edge of bed to R side with +2 max to total A. Pt propelled w/c about 50 feet with L UE and max A. In gym treatment focused on transfers w/c to mat. Attempted sliding board transfers, max to total A when transferring to the L side and total A when transferring to the R side with verbal cues for technique and safety. Pt requires multiple verbal and tactile cues throughout treatment for upright posture. Pt tends to lean posteriorly and to the right, verbal both verbal cues and physical assist to correct. Pt transferred sit to stand in the parallel bars with max A, while standing focused on weight shifting. Pt also performed stand pivot transfer w/c to mat to L side x 2 with total A.     Therapy Documentation Precautions:  Precautions Precautions: Fall Restrictions Weight Bearing Restrictions: No General:   Pain: No c/o pain.   See FIM for current functional status  Therapy/Group: Individual Therapy  Dub Amis 09/06/2013, 12:48 PM

## 2013-09-06 NOTE — Progress Notes (Signed)
Occupational Therapy Session Note  Patient Details  Name: [name] MRN: [id number] Date of Birth: 08/01/1925  Today's Date: 09/06/2013  First session: 1012-1030 (18 minutes) Patient scheduled for ADL but already bathed and dressed with his personal caregiver.   The am session focused on caregiver/patient education, endurance and bed mobility.    Patient very fatigued and dosed for mos of session.   Second session: Time: 0109-3235 Time Calculation (min): 45 minBed mobility with Max A x 2; bed to w/c transfer bed to 3:1=Max to Total A x2;    Toileting= Total A x 3;    3:1-w/c transfer= Max A x2.   Patient was very, very fatigued, and his cognition and attention fluctuated during this session.    He tended to either push strongly to his left or not weight bear at time during transfers and standing due to tone in R LE.   He did participate and attend greater for his final transfer 3:1 to w/c this session.  Skilled Therapeutic Interventions/Progress Updates:     Therapy Documentation Precautions:  Precautions Precautions: Fall Restrictions Weight Bearing Restrictions: No  Pain:denied   See FIM for current functional status  Therapy/Group: Individual Therapy  Alfredia Ferguson [facility] 09/06/2013, 6:41 PM

## 2013-09-06 NOTE — Progress Notes (Signed)
Subjective/Complaints: 80 y.o. right-handed male was documented history of Alzheimer's disease. Patient lives with his wife and used a cane prior to admission. Patient on no scheduled medications prior to admission. Admitted 08/27/2013 with right side weakness and altered mental status. Cranial CT scan showed no acute abnormalities. Patient did receive TPA. MRI of the brain showed small patchy areas of acute infarct in the left parietal lobe. Possible 5 mm acute infarct right parietal white matter. MRA of the head with no large vessel occlusion. Echocardiogram with ejection fraction of 55% and normal systolic function.  Carotid Dopplers with no ICA stenosis. EEG negative for seizure  Eating well, caregiver asking for pass  Review of Systems - incomplete secondary to cognitive issues  Objective: Vital Signs: Blood pressure 142/78, pulse 98, temperature 98.5 F (36.9 C), temperature source Oral, resp. rate 20, height 6' (1.829 m), weight 82.963 kg (182 lb 14.4 oz), SpO2 94.00%. No results found. No results found for this or any previous visit (from the past 72 hour(s)).   HEENT: normal Cardio: RRR and no murmurs Resp: CTA B/L and unlabored GI: BS positive and ND Extremity:  Pulses positive and No Edema Skin:   Intact Neuro: Confused, Flat, Cranial Nerve II-XII normal, Abnormal Sensory cannot fully assess but grossly intact LT B UE and BLE, Abnormal Motor 4/5 Bilateral delt bi tri grip HF KE ADF and Abnormal FMC Ataxic/ dec FMC Musc/Skel:  Other bilateral shoulder contracture, bilateral hamstrings contracture Gen NAD   Assessment/Plan: 1. Functional deficits secondary to R MCA infarct superimposed on chronic deconditioning which require 3+ hours per day of interdisciplinary therapy in a comprehensive inpatient rehab setting. Physiatrist is providing close team supervision and 24 hour management of active medical problems listed below. Physiatrist and rehab team continue to assess barriers  to discharge/monitor patient progress toward functional and medical goals. FIM: FIM - Bathing Bathing Steps Patient Completed: Chest;Abdomen;Right upper leg;Left upper leg Bathing: 2: Max-Patient completes 3-4 43f 10 parts or 25-49%  FIM - Upper Body Dressing/Undressing Upper body dressing/undressing steps patient completed: Thread/unthread right sleeve of pullover shirt/dresss;Thread/unthread left sleeve of pullover shirt/dress Upper body dressing/undressing: 2: Max-Patient completed 25-49% of tasks FIM - Lower Body Dressing/Undressing Lower body dressing/undressing: 1: Two helpers  FIM - Toileting Toileting: 1: Total-Patient completed zero steps, helper did all 3  FIM - Radio producer Devices: Bedside commode (UE support on sink) Toilet Transfers: 1-Two helpers  FIM - Control and instrumentation engineer Devices: Copy: 1: Two helpers  FIM - Locomotion: Glass blower/designer: Wheelchair: 1: Total Assistance/staff pushes wheelchair (Pt<25%) FIM - Locomotion: Ambulation Locomotion: Ambulation Assistive Devices: Other (comment) (L hallway rail) Ambulation/Gait Assistance: 2: Max assist Locomotion: Ambulation: 0: Activity did not occur  Comprehension Comprehension Mode: Auditory Comprehension: 3-Understands basic 50 - 74% of the time/requires cueing 25 - 50%  of the time  Expression Expression Mode: Verbal Expression: 3-Expresses basic 50 - 74% of the time/requires cueing 25 - 50% of the time. Needs to repeat parts of sentences.  Social Interaction Social Interaction: 4-Interacts appropriately 75 - 89% of the time - Needs redirection for appropriate language or to initiate interaction.  Problem Solving Problem Solving: 4-Solves basic 75 - 89% of the time/requires cueing 10 - 24% of the time  Memory Memory: 1-Recognizes or recalls less than 25% of the time/requires cueing greater than 75% of the time  Medical  Problem List and Plan:  1. Thrombotic Right MCA infarct  2. DVT Prophylaxis/Anticoagulation: SCDs.Monitor for any signs  of DVT.  3. Pain Management: Tylenol as needed  4. Mood/dementia: baseline cognitive deficits prior to CVA. Perhaps some mild improvement just before CVA.  - Bed alarm for safety  - trial of aricept  5. Neuropsych: This patient is not capable of making decisions on his own behalf.   LOS (Days) 5 A FACE TO FACE EVALUATION WAS PERFORMED  KIRSTEINS,ANDREW E 09/06/2013, 8:26 AM

## 2013-09-07 ENCOUNTER — Inpatient Hospital Stay (HOSPITAL_COMMUNITY): Payer: Medicare Other | Admitting: Physical Therapy

## 2013-09-07 ENCOUNTER — Inpatient Hospital Stay (HOSPITAL_COMMUNITY): Payer: Medicare Other | Admitting: Speech Pathology

## 2013-09-07 ENCOUNTER — Inpatient Hospital Stay (HOSPITAL_COMMUNITY): Payer: Medicare Other | Admitting: Occupational Therapy

## 2013-09-07 DIAGNOSIS — F0390 Unspecified dementia without behavioral disturbance: Secondary | ICD-10-CM

## 2013-09-07 DIAGNOSIS — I6350 Cerebral infarction due to unspecified occlusion or stenosis of unspecified cerebral artery: Secondary | ICD-10-CM

## 2013-09-07 DIAGNOSIS — R339 Retention of urine, unspecified: Secondary | ICD-10-CM

## 2013-09-07 NOTE — Progress Notes (Signed)
Physical Therapy Session Note  Patient Details  Name: Matthew Hodge MRN: [id number] Date of Birth: Jan 15, 1925  Today's Date: 09/07/2013 Time: 1000-1106 and 0160-1093 Time Calculation (min): 66 min and 44 min  Short Term Goals: Week 1:  PT Short Term Goal 1 (Week 1): Pt will perform supine<>sit with max A PT Short Term Goal 2 (Week 1): Pt will transfer bed<>w/c max A +1 PT Short Term Goal 3 (Week 1): Pt will maintain static sitting balance with min A  Skilled Therapeutic Interventions/Progress Updates:   Pt resting in w/c with caregiver present.  Performed w/c mobility training with bilat LE propulsion with focus on attention to task, initiation of RLE knee flexion<>extension and coordinated alternating LE movement to propel w/c in controlled environment x 100' with max A to initiate and maintain activation of RLE.  Performed transfers mat <> w/c squat pivot with max A over the back technique and reaching to facilitate L lateral and forward weight shift.  Performed supine <> sit on flat mat with mod-max A and verbal cues and extra time for pt to initiate movement.  Also performed rolling on mat to L side <> supine > R side with mod A overall with assistance to fully flex RLE and verbal cues for head turn and reaching with UE across midline to initiate rolling.  R side > sit with max A to position RUE and to bring trunk fully upright on mat.  While on supine on mat performed bilat LE PROM with hamstring stretches with contract-relax and hip stretch into IR with focus on minimizing activation of L quad and ER.  In sidelying performed PROM RLE hip flexor stretch for increased ROM for standing and transfers.  Also in sidelying performed NMR; see below for details.  Back in room pt pelvis wedged with pillows for positioning and to minimize pushing to R.    PM session: pt anxious to begin walking.  Transferred to day room in w/c total A.  Pt set up in [place] with walking sling.  Performed gait x 40' + 13'  in [place] with one person guiding and steadying SARA and therapist on R side assisting with L lateral weight shifting and verbally cuing stepping sequence.  Pt demonstrated decreased pushing in SARA and improved forwards and lateral weight shifting with improved upright trunk, step and stride length and initiation of stepping and WB through RLE.  Returned to room and transferred to bed with max squat pivot and sit > supine with max A.    Therapy Documentation Precautions:  Precautions Precautions: Fall Restrictions Weight Bearing Restrictions: No Pain: Pain Assessment Pain Assessment: No/denies pain Other Treatments: Treatments Neuromuscular Facilitation: Right;Lower Extremity;Activity to increase coordination;Activity to increase motor control;Activity to increase timing and sequencing;Activity to increase sustained activation with RLE on powder board with focus on gait sequence of hip and knee flexion <> hip and knee extension activation x 12 reps with AAROM and verbal and tactile cues for glute activation.   See FIM for current functional status  Therapy/Group: Individual Therapy  [name] [facility] 09/07/2013, 12:13 PM

## 2013-09-07 NOTE — Progress Notes (Signed)
Occupational Therapy Session Note  Patient Details  Name: Matthew Hodge MRN: [id number] Date of Birth: 1925/08/20  Today's Date: 09/07/2013 Time: 4536-4680 Time Calculation (min): 55 min  Short Term Goals: Week 1:  OT Short Term Goal 1 (Week 1): Pt will complete sit <> stand for LB bathing with max assist of one caregiver OT Short Term Goal 2 (Week 1): Pt will complete toilet transfer with max assist of one caregiver OT Short Term Goal 3 (Week 1): Pt will complete UB dressing with mod assist of one caregiver OT Short Term Goal 4 (Week 1): Pt will complete LB drssing with max assist of one caregiver  Skilled Therapeutic Interventions/Progress Updates:    Pt seen for ADL retraining with focus on bed mobility, midline sitting balance, and increased participation in self-care tasks of bathing and dressing. Pt received in bed with HOB up shaving with electric razor and caregiver, [name] at his bedside. Engaged in pt bathing [place] with HOB up and therapist washing bottom with patient in sidelying. Patient with mod-max assist for bed mobility and max cues for sequencing and hand placement to assist with rolling, 2nd person to stabilize pt in sidelying while therapist washed bottom. Bath completed sitting EOB as well as dressing. Patient required max cues for midline orientation strategies secondary to patient with pushing to right which became more significant with fatigue. Tactile cues provided at lower back to promote awareness of posterior lean. Attempted weight shifting down on Lt elbow to promote forward weight shift, pt unable to follow and maintain, therefore encouraged pt to place Rt hand on Lt to knee to maintain forward midline sitting.  Pt required total assist for LB dressing and +2 to stand for pants to be pulled over hips. Max assist +2 squat pivot transfer when going to his right. Upon seated in w/c, pt reports need to toilet.  Performed squat pivot +2 to drop arm BSC, pt left on [facility] with  caregiver and nurse tech present.  Therapy Documentation Precautions:  Precautions Precautions: Fall Restrictions Weight Bearing Restrictions: No Pain: Pain Assessment Pain Assessment: No/denies pain  See FIM for current functional status  Therapy/Group: Individual Therapy  Simonne Come 09/07/2013, 9:32 AM

## 2013-09-07 NOTE — Progress Notes (Signed)
Speech Language Pathology Daily Session Note  Patient Details  Name: Matthew Hodge MRN: [id number] Date of Birth: 02/28/1925  Today's Date: 09/07/2013 Time: 5366-4403 Time Calculation (min): 35 min  Short Term Goals: Week 1: SLP Short Term Goal 1 (Week 1): Pt will sustain attention to functional task for 60 seconds with Max cues SLP Short Term Goal 2 (Week 1): Pt will use environmental clues to demonstrate orientation to time, place, and situation with Max cues SLP Short Term Goal 3 (Week 1): Pt will communicate basic wants/needs with Max cues SLP Short Term Goal 4 (Week 1): Pt will use basic problem solving to utilize call bell with Max cues  Skilled Therapeutic Interventions: Skilled treatment session focused on addressing cognitive goals. SLP facilitated session with new, functional cognitive task. Patient sustained attention to task for ~30-35 minutes with Supervision level verbal cues for redirection. Patient required Mod cues for sequencing, problem solving and working memory as he compared/contrasted picture to 3-D object we were constructing.  Continue plan of care.     FIM:  Comprehension Comprehension Mode: Auditory Comprehension: 3-Understands basic 50 - 74% of the time/requires cueing 25 - 50%  of the time Expression Expression Mode: Verbal Expression: 4-Expresses basic 75 - 89% of the time/requires cueing 10 - 24% of the time. Needs helper to occlude trach/needs to repeat words. Social Interaction Social Interaction: 4-Interacts appropriately 75 - 89% of the time - Needs redirection for appropriate language or to initiate interaction. Problem Solving Problem Solving: 3-Solves basic 50 - 74% of the time/requires cueing 25 - 49% of the time Memory Memory: 1-Recognizes or recalls less than 25% of the time/requires cueing greater than 75% of the time FIM - Eating Eating Activity: 5: Set-up assist for cut food  Pain Pain Assessment Pain Assessment: No/denies  pain  Therapy/Group: Individual Therapy  Carmelia Roller., CCC-SLP [phone]  [place] 09/07/2013, 4:51 PM

## 2013-09-07 NOTE — Progress Notes (Signed)
Subjective/Complaints: 80 y.o. right-handed male was documented history of Alzheimer's disease. Patient lives with his wife and used a cane prior to admission. Patient on no scheduled medications prior to admission. Admitted 08/27/2013 with right side weakness and altered mental status. Cranial CT scan showed no acute abnormalities. Patient did receive TPA. MRI of the brain showed small patchy areas of acute infarct in the left parietal lobe. Possible 5 mm acute infarct right parietal white matter. MRA of the head with no large vessel occlusion. Echocardiogram with ejection fraction of 55% and normal systolic function.  Carotid Dopplers with no ICA stenosis. EEG negative for seizure  Oriented to self only.  Resting quietly, easily arousable, HOH  Review of Systems - incomplete secondary to cognitive issues  Objective: Vital Signs: Blood pressure 145/79, pulse 83, temperature 98 F (36.7 C), temperature source Oral, resp. rate 19, height 6' (1.829 m), weight 82.963 kg (182 lb 14.4 oz), SpO2 96.00%. No results found. No results found for this or any previous visit (from the past 72 hour(s)).   HEENT: normal Cardio: RRR and no murmurs Resp: CTA B/L and unlabored GI: BS positive and ND, suprapubic cath site intact Extremity:  Pulses positive and No Edema Skin:   Intact Neuro: Confused, Flat, Cranial Nerve II-XII normal, Abnormal Sensory cannot fully assess but grossly intact LT B UE and BLE, Abnormal Motor 4/5 Bilateral delt bi tri grip HF KE ADF and Abnormal FMC Ataxic/ dec FMC Musc/Skel:  Other bilateral shoulder contracture, bilateral hamstrings contracture Gen NAD   Assessment/Plan: 1. Functional deficits secondary to R MCA infarct superimposed on chronic deconditioning which require 3+ hours per day of interdisciplinary therapy in a comprehensive inpatient rehab setting. Physiatrist is providing close team supervision and 24 hour management of active medical problems listed  below. Physiatrist and rehab team continue to assess barriers to discharge/monitor patient progress toward functional and medical goals. FIM: FIM - Bathing Bathing Steps Patient Completed: Chest;Abdomen;Right upper leg;Left upper leg Bathing: 0: Activity did not occur (caregiver completed his ADL early this am)  FIM - Upper Body Dressing/Undressing Upper body dressing/undressing steps patient completed: Thread/unthread right sleeve of pullover shirt/dresss;Thread/unthread left sleeve of pullover shirt/dress Upper body dressing/undressing: 1: Two helpers FIM - Lower Body Dressing/Undressing Lower body dressing/undressing: 1: Two helpers  FIM - Toileting Toileting: 1: Total-Patient completed zero steps, helper did all 3  FIM - Radio producer Devices: Recruitment consultant Transfers: 1-Two helpers  FIM - Control and instrumentation engineer Devices: [place] Transfer: 0: Activity did not occur  FIM - Locomotion: Wheelchair Locomotion: Wheelchair: 0: Activity did not occur FIM - Locomotion: Ambulation Locomotion: Ambulation Assistive Devices: Other (comment) (L hallway rail) Ambulation/Gait Assistance: 2: Max assist Locomotion: Ambulation: 0: Activity did not occur  Comprehension Comprehension Mode: Auditory Comprehension: 3-Understands basic 50 - 74% of the time/requires cueing 25 - 50%  of the time  Expression Expression Mode: Verbal Expression: 3-Expresses basic 50 - 74% of the time/requires cueing 25 - 50% of the time. Needs to repeat parts of sentences.  Social Interaction Social Interaction: 4-Interacts appropriately 75 - 89% of the time - Needs redirection for appropriate language or to initiate interaction.  Problem Solving Problem Solving: 3-Solves basic 50 - 74% of the time/requires cueing 25 - 49% of the time  Memory Memory: 1-Recognizes or recalls less than 25% of the time/requires cueing greater  than 75% of the time  Medical Problem List and Plan:  1. Thrombotic Right MCA infarct  2. DVT Prophylaxis/Anticoagulation: SCDs.Monitor for any signs of DVT.  3. Pain Management: Tylenol as needed  4. Mood/dementia: baseline cognitive deficits prior to CVA. Perhaps some mild improvement just before CVA.  - Bed alarm for safety  - trial of aricept  5. Neuropsych: This patient is not capable of making decisions on his own behalf. 6.  Hx bladder outlet obstruction chronic supr pubic cath  LOS (Days) 6 A FACE TO FACE EVALUATION WAS PERFORMED  Kimani Hovis E 09/07/2013, 7:00 AM

## 2013-09-08 ENCOUNTER — Inpatient Hospital Stay (HOSPITAL_COMMUNITY): Payer: Medicare Other | Admitting: Speech Pathology

## 2013-09-08 ENCOUNTER — Inpatient Hospital Stay (HOSPITAL_COMMUNITY): Payer: Medicare Other

## 2013-09-08 ENCOUNTER — Inpatient Hospital Stay (HOSPITAL_COMMUNITY): Payer: Medicare Other | Admitting: Occupational Therapy

## 2013-09-08 NOTE — Plan of Care (Signed)
Problem: RH BLADDER ELIMINATION Goal: RH STG MANAGE BLADDER WITH ASSISTANCE STG Manage Bladder With maxx Assistance  Outcome: Not Progressing Staff provides care for supra-pubic cathter

## 2013-09-08 NOTE — Progress Notes (Signed)
Subjective/Complaints: 80 y.o. right-handed male was documented history of Alzheimer's disease. Patient lives with his wife and used a cane prior to admission. Patient on no scheduled medications prior to admission. Admitted 08/27/2013 with right side weakness and altered mental status. Cranial CT scan showed no acute abnormalities. Patient did receive TPA. MRI of the brain showed small patchy areas of acute infarct in the left parietal lobe. Possible 5 mm acute infarct right parietal white matter. MRA of the head with no large vessel occlusion. Echocardiogram with ejection fraction of 55% and normal systolic function.  Carotid Dopplers with no ICA stenosis. EEG negative for seizure  Oriented to self and [facility] "I need some shorts"  Review of Systems - incomplete secondary to cognitive issues  Objective: Vital Signs: Blood pressure 150/68, pulse 81, temperature 97.9 F (36.6 C), temperature source Oral, resp. rate 18, height 6' (1.829 m), weight 82.963 kg (182 lb 14.4 oz), SpO2 98.00%. No results found. No results found for this or any previous visit (from the past 72 hour(s)).   HEENT: normal Cardio: RRR and no murmurs Resp: CTA B/L and unlabored GI: BS positive and ND, suprapubic cath site intact Extremity:  Pulses positive and No Edema Skin:   Intact Neuro: Confused, Flat, Cranial Nerve II-XII normal, Abnormal Sensory cannot fully assess but grossly intact LT B UE and BLE, Abnormal Motor 4/5 Bilateral delt bi tri grip HF KE ADF and Abnormal FMC Ataxic/ dec FMC Musc/Skel:  Other bilateral shoulder contracture, bilateral hamstrings contracture Gen NAD   Assessment/Plan: 1. Functional deficits secondary to R MCA infarct superimposed on chronic deconditioning which require 3+ hours per day of interdisciplinary therapy in a comprehensive inpatient rehab setting. Physiatrist is providing close team supervision and 24 hour management of active medical problems listed below. Physiatrist and  rehab team continue to assess barriers to discharge/monitor patient progress toward functional and medical goals. FIM: FIM - Bathing Bathing Steps Patient Completed: Chest;Right Arm;Left Arm;Abdomen;Front perineal area;Right upper leg;Left upper leg Bathing: 3: Mod-Patient completes 5-7 59f 10 parts or 50-74%  FIM - Upper Body Dressing/Undressing Upper body dressing/undressing steps patient completed: Thread/unthread right sleeve of pullover shirt/dresss;Thread/unthread left sleeve of pullover shirt/dress Upper body dressing/undressing: 3: Mod-Patient completed 50-74% of tasks FIM - Lower Body Dressing/Undressing Lower body dressing/undressing: 1: Two helpers  FIM - Toileting Toileting: 1: Two helpers  FIM - Radio producer Devices: Recruitment consultant Transfers: 1-Two helpers  FIM - Control and instrumentation engineer Devices: Arm rests Bed/Chair Transfer: 2: Chair or W/C > Bed: Max A (lift and lower assist);2: Bed > Chair or W/C: Max A (lift and lower assist)  FIM - Locomotion: Wheelchair Locomotion: Wheelchair: 2: Travels 50 - 149 ft with maximal assistance (Pt: 25 - 49%) FIM - Locomotion: Ambulation Locomotion: Ambulation Assistive Devices: Other (comment) (L hallway rail) Ambulation/Gait Assistance: 2: Max assist Locomotion: Ambulation: 0: Activity did not occur  Comprehension Comprehension Mode: Auditory Comprehension: 3-Understands basic 50 - 74% of the time/requires cueing 25 - 50%  of the time  Expression Expression Mode: Verbal Expression: 4-Expresses basic 75 - 89% of the time/requires cueing 10 - 24% of the time. Needs helper to occlude trach/needs to repeat words.  Social Interaction Social Interaction: 4-Interacts appropriately 75 - 89% of the time - Needs redirection for appropriate language or to initiate interaction.  Problem Solving Problem Solving: 3-Solves basic 50 - 74% of the time/requires cueing 25 - 49% of the  time  Memory Memory: 1-Recognizes or recalls less than  25% of the time/requires cueing greater than 75% of the time  Medical Problem List and Plan:  1. Thrombotic Right MCA infarct  2. DVT Prophylaxis/Anticoagulation: SCDs.Monitor for any signs of DVT.  3. Pain Management: Tylenol as needed  4. Mood/dementia: baseline cognitive deficits prior to CVA. Perhaps some mild improvement just before CVA.  - Bed alarm for safety  - trial of aricept  5. Neuropsych: This patient is not capable of making decisions on his own behalf. 6.  Hx bladder outlet obstruction chronic supr pubic cath  LOS (Days) 7 A FACE TO FACE EVALUATION WAS PERFORMED  Jaleil Renwick E 09/08/2013, 6:40 AM

## 2013-09-08 NOTE — Progress Notes (Signed)
Occupational Therapy Session Note  Patient Details  Name: Matthew Hodge MRN: [id number] Date of Birth: [birth year]  Today's Date: 09/08/2013 Time: 0832-0930 Time Calculation (min): 58 min  Short Term Goals: Week 1:  OT Short Term Goal 1 (Week 1): Pt will complete sit <> stand for LB bathing with max assist of one caregiver OT Short Term Goal 2 (Week 1): Pt will complete toilet transfer with max assist of one caregiver OT Short Term Goal 3 (Week 1): Pt will complete UB dressing with mod assist of one caregiver OT Short Term Goal 4 (Week 1): Pt will complete LB drssing with max assist of one caregiver  Skilled Therapeutic Interventions/Progress Updates:    Pt seen for ADL retraining with focus on bed mobility, midline sitting balance, and increased participation in self-care tasks of bathing and dressing. Pt received in bed with HOB up having just completed shaving with caregiver, [name] at his bedside. Engaged in pt bathing periarea with HOB up and therapist washing bottom with patient in sidelying. Patient with mod-max assist for bed mobility and max cues for sequencing and hand placement to assist with rolling, 2nd person to stabilize pt in sidelying while therapist washed bottom. Bath completed sitting EOB as well as dressing. Patient required max cues for midline orientation strategies secondary to patient with pushing posterior and to right which became more significant with fatigue. Tactile cues provided at lower back to promote awareness of posterior lean. Encouraged pt to place Rt hand on Lt to knee to maintain forward midline sitting. Engaged pt in reaching forward to pull pants over knees with pt requiring max encouragement. Pt required total assist for LB dressing and +2 to stand for pants to be pulled over hips, pt unable to achieve full stand at this time. Max assist +2 squat pivot transfer with pt going to his right.   Therapy Documentation Precautions:  Precautions Precautions:  Fall Restrictions Weight Bearing Restrictions: No Pain:   Pt with no c/o pain this session.  See FIM for current functional status  Therapy/Group: Individual Therapy  Simonne Come 09/08/2013, 9:44 AM

## 2013-09-08 NOTE — Progress Notes (Signed)
Physical Therapy Session Note  Patient Details  Name: Matthew Hodge MRN: [id number] Date of Birth: 1925-04-22  Today's Date: 09/08/2013 Time: 10:00 - 10:55 and [id number] Time Calculation (min): 55 minutes and 40 min  Short Term Goals: Week 1:  PT Short Term Goal 1 (Week 1): Pt will perform supine<>sit with max A PT Short Term Goal 2 (Week 1): Pt will transfer bed<>w/c max A +1 PT Short Term Goal 3 (Week 1): Pt will maintain static sitting balance with min A  Skilled Therapeutic Interventions/Progress Updates:  Session 1: Patient sitting in wheelchair upon entering room. Patient pushed in wheelchair to gym. Patient stood with stedy multiple reps working on:  flexing forward at trunk to get weight shifted forward; maintaining midline and decreasing push to right; and getting terminal extension of hips and knees during stance. Patient continues to push to right but this decreased throughout the session. Attempted to stand with rolling walker. Patient pushing heavily to right and requesting to sit. Attempted to ambulate in hallway with railing on left and right UE over therapist shoulder. Patient complained of pain so this was terminated after about 10 feet. Patient worked on Librarian, academic using bilateral LE's forward to work on hamstrings and backwards to work on [employer] about 20 feet each direction. Patient left sitting in wheelchair with friend, [name] in room and call bell in reach.  Session 2: Focus on ambulation with Clarise Cruz plus. Patient ambulated 50 feet x 1, 80 feet x 1 and 90 feet x 1 with +2 assist and sara plus - 1 to guide sara and 1 to facilitate patient. Patient required assist with weight shift to left to unweight right LE and progress it forward. Patient tended to slide left LE forward and required verbal cueing to take larger step on right. Patient returned to room and left sitting in wheelchair with friend, [name] in room and call bell in reach.  Therapy  Documentation Precautions:  Precautions Precautions: Fall Restrictions Weight Bearing Restrictions: No  Pain: Pain Assessment Pain Assessment: No/denies pain Locomotion : Ambulation Ambulation/Gait Assistance: 1: +2 Total assist   See FIM for current functional status  Therapy/Group: Individual Therapy  Elder Love M 09/08/2013, 4:03 PM

## 2013-09-08 NOTE — Progress Notes (Signed)
Speech Language Pathology Weekly Progress Note & Daily Session   Patient Details  Name: Matthew Hodge MRN: [id number] Date of Birth: [birth year]  Today's Date: 09/08/2013 Time: 1510-1550 Time Calculation (min): 40 min  Short Term Goals: Week 1: SLP Short Term Goal 1 (Week 1): Pt will sustain attention to functional task for 60 seconds with Max cues SLP Short Term Goal 1 - Progress (Week 1): Met SLP Short Term Goal 2 (Week 1): Pt will use environmental clues to demonstrate orientation to time, place, and situation with Max cues SLP Short Term Goal 2 - Progress (Week 1): Met SLP Short Term Goal 3 (Week 1): Pt will communicate basic wants/needs with Max cues SLP Short Term Goal 3 - Progress (Week 1): Progressing toward goal SLP Short Term Goal 4 (Week 1): Pt will use basic problem solving to utilize call bell with Max cues SLP Short Term Goal 4 - Progress (Week 1): Progressing toward goal  Week 2: SLP Short Term Goal 1 (Week 2): Pt will sustain attention to functional task for 2 minutes with Max cues SLP Short Term Goal 2 (Week 2): Pt will use environmental clues to demonstrate orientation to time and place with Max cues SLP Short Term Goal 3 (Week 2): Pt will communicate basic wants/needs with Max cues SLP Short Term Goal 4 (Week 2): Pt will use basic problem solving to utilize call bell with Max cues  Weekly Progress Updates: Patient met 2 out of 4 short term goals this reporting period due to gains in sustained attention for short periods of time and orientation to time and place with clinician cues. Patient made progress toward expression of basic wants and needs use of call bell despite not meeting goals this week; as a result these goals will be carried over into the next reporting period.  Patient continues to require 24/7 supervision and Mod-Max cues to complete basic tasks due to fluctuating cognition.  This patient requires continued skilled SLP services to maximize his initiation  of verbal expression as well as functional problem solving prior to discharge home with 24/7 assist form family/friends.   SLP Intensity: Minumum of 1-2 x/day, 30 to 90 minutes SLP Frequency: 5 out of 7 days SLP Duration/Estimated Length of Stay: 24-28 days SLP Treatment/Interventions: Cognitive remediation/compensation;Cueing hierarchy;Environmental controls;Functional tasks;Internal/external aids;Patient/family education;Speech/Language facilitation  Daily Session Skilled Intervention:  Skilled treatment session focused on addressing cognitive goals. SLP facilitated session with organizing object task.  Patient was asked to sort objects according to function and where they are kept in the house; he required Mod cues to participate and Max cues to organize/sort. Patient became briefly tearful with discussion regarding current situation to address awareness and orientation and requested that SLP stop asking him questions.  Patient problem solved elf-feeding with overall Min cues.  Patient left at RN station at end of session; continue plan of care.    FIM:  Comprehension Comprehension Mode: Auditory Comprehension: 2-Understands basic 25 - 49% of the time/requires cueing 51 - 75% of the time Expression Expression Mode: Verbal Expression: 3-Expresses basic 50 - 74% of the time/requires cueing 25 - 50% of the time. Needs to repeat parts of sentences. Social Interaction Social Interaction: 3-Interacts appropriately 50 - 74% of the time - May be physically or verbally inappropriate. Problem Solving Problem Solving: 2-Solves basic 25 - 49% of the time - needs direction more than half the time to initiate, plan or complete simple activities Memory Memory: 1-Recognizes or recalls less than 25% of the  time/requires cueing greater than 75% of the time General  Amount of Missed SLP Time (min): 5 Minutes Pain Pain Assessment Pain Assessment: No/denies pain  Therapy/Group: Individual  Therapy  Carmelia Roller., [place] [phone]  [place] 09/08/2013, 5:03 PM

## 2013-09-09 ENCOUNTER — Inpatient Hospital Stay (HOSPITAL_COMMUNITY): Payer: Medicare Other | Admitting: Physical Therapy

## 2013-09-09 ENCOUNTER — Inpatient Hospital Stay (HOSPITAL_COMMUNITY): Payer: Medicare Other | Admitting: Speech Pathology

## 2013-09-09 ENCOUNTER — Inpatient Hospital Stay (HOSPITAL_COMMUNITY): Payer: Medicare Other | Admitting: Occupational Therapy

## 2013-09-09 NOTE — Progress Notes (Signed)
Social Work Elease Hashimoto, LCSW Social Worker Signed  Patient Care Conference Service date: 09/09/2013 3:00 PM  Inpatient RehabilitationTeam Conference and Plan of Care Update Date: 09/09/2013   Time: 11;45 Am     Patient Name: Matthew Hodge       Medical Record Number: [id number]   Date of Birth: 02/21/1925 Sex: Male         Room/Bed: 4W12C/4W12C-01 Payor Info: Payor: MEDICARE / Plan: MEDICARE PART A AND B / Product Type: *No Product type* /   Admitting Diagnosis: CVA   Admit Date/Time:  09/01/2013  5:10 PM Admission Comments: No comment available   Primary Diagnosis:  <principal problem not specified> Principal Problem: <principal problem not specified>    Patient Active Problem List     Diagnosis  Date Noted   .  CVA (cerebral infarction)  09/01/2013   .  Stroke  08/27/2013   .  Alzheimer's disease     .  Enlarged prostate with urinary retention  07/15/2012   .  Chronic constipation  07/15/2012   .  Hyperglycemia  07/15/2012   .  Acute renal failure  07/15/2012   .  Leukocytosis  07/15/2012   .  Normocytic anemia  07/15/2012   .  UTI (lower urinary tract infection)  07/15/2012     Expected Discharge Date: Expected Discharge Date: 09/26/13  Team Members Present: Physician leading conference: Dr. Alysia Penna Social Worker Present: Ovidio Kin, LCSW Nurse Present: Other (comment) Nigel Bridgeman) PT Present: [name], PT;Emily Rinaldo Cloud, PT OT Present: Simonne Come, OT;Kris Nira Retort, OT SLP Present: Gunnar Fusi, SLP PPS Coordinator present : Daiva Nakayama, RN, CRRN        Current Status/Progress  Goal  Weekly Team Focus   Medical     Shoulder and knee contractures improving  Increase mobility and range of motion  coontinue treatment program, initiate caregiver training   Bowel/Bladder     Contient of bowel suprapubic cath   Remain continent of bowel, caregiver able to manage suprapubic cath   Educate on suprapubic cath maintanence    Swallow/Nutrition/ Hydration     [facility]       ADL's     mod assist bathing at bed level, mod assist UB dressing, +2 for LB dressing at sit > stand level, max to +2 for transfers  min-mod assist overall  self-care retraining, sitting balance, transfers   Mobility     Max-total A  Min-mod A overall  decreasing assistance for transfers, postural control, standing balance, gait   Communication     Mod-Mod assist for initiation   Min assist   increase initiation of wants and needs   Safety/Cognition/ Behavioral Observations    Max assist for orientation, attention to task and basic problem solving  Min assist   increase use of external aids, attention, basic problem solving    Pain     no c/o pain  pain <2  Assess pain q shift and prn and treat as needed   Skin     scattered bruising to BUE; suprapubic cath site split quaze, CDI  No further skin breakdown/infection  Assess skin q shift and prn continue with turn schedule     *See Care Plan and progress notes for long and short-term goals.    Barriers to Discharge:  Wife is unable help      Possible Resolutions to Barriers:    Increased hours of caregiver      Discharge Planning/Teaching Needs:  Family aware pt will require 24 hr care, [name] caregiver here daily and participating in pt's care      Team Discussion:    Pt making slow progress, maybe do better in own environment due to dementia.  Will begin caregiver training and see if managable with gary.  Becoming more anxious here.   Revisions to Treatment Plan:    May move up discharge dependent upon caregiver training    Continued Need for Acute Rehabilitation Level of Care: The patient requires daily medical management by a physician with specialized training in physical medicine and rehabilitation for the following conditions: Daily direction of a multidisciplinary physical rehabilitation program to ensure safe treatment while eliciting the highest outcome that is of practical  value to the patient.: Yes Daily medical management of patient stability for increased activity during participation in an intensive rehabilitation regime.: Yes Daily analysis of laboratory values and/or radiology reports with any subsequent need for medication adjustment of medical intervention for : Neurological problems;Other  Bradd Merlos, [name] 09/09/2013, 3:00 PM         Elease Hashimoto, LCSW Social Worker Signed  Patient Care Conference Service date: 09/02/2013 3:08 PM  Inpatient RehabilitationTeam Conference and Plan of Care Update Date: 09/02/2013   Time: 11;05 AM     Patient Name: Matthew Hodge       Medical Record Number: [id number]   Date of Birth: [birth year] Sex: Male         Room/Bed: 4W12C/4W12C-01 Payor Info: Payor: MEDICARE / Plan: MEDICARE PART A AND B / Product Type: *No Product type* /   Admitting Diagnosis: CVA   Admit Date/Time:  09/01/2013  5:10 PM Admission Comments: No comment available   Primary Diagnosis:  <principal problem not specified> Principal Problem: <principal problem not specified>    Patient Active Problem List     Diagnosis  Date Noted   .  CVA (cerebral infarction)  09/01/2013   .  Stroke  08/27/2013   .  Alzheimer's disease     .  Enlarged prostate with urinary retention  07/15/2012   .  Chronic constipation  07/15/2012   .  Hyperglycemia  07/15/2012   .  Acute renal failure  07/15/2012   .  Leukocytosis  07/15/2012   .  Normocytic anemia  07/15/2012   .  UTI (lower urinary tract infection)  07/15/2012     Expected Discharge Date: Expected Discharge Date: 09/26/13  Team Members Present: Physician leading conference: Dr. Alysia Penna Social Worker Present: Ovidio Kin, LCSW Nurse Present: Nanine Means, RN PT Present: [name], PT;Emily Rinaldo Cloud, PT OT Present: Simonne Come, Starling Manns, Maryella Shivers, OT SLP Present: Gunnar Fusi, SLP PPS Coordinator present : Daiva Nakayama, RN, CRRN        Current  Status/Progress  Goal  Weekly Team Focus   Medical     pt with decline PTA  maintain medical stability  initiate treatment   Bowel/Bladder     Continent of bowel LBM 09/01/13, Suprapubic cath in place  Continent of Bowel  [place]aintian patency of suprapubic cath   Swallow/Nutrition/ Hydration     regular diet       ADL's     total assist +2   min-mod assist overall  self-care retraining, sitting balance, transfers   Mobility     Total A +2  Min-mod A overall  bed mobility, sitting balance, transfers, gait   Communication     family reports decreased verbal expression  Min  verbalizing  wants/needs   Safety/Cognition/ Behavioral Observations    Max-Total A for orientation, sustained attention, basic problem solving  Min  orientaiton, use of external aids, sustained attention, basic problem solving   Pain     No c/o pain  Pain <3  assess for pain q shift and treat with prn meds as ordered   Skin     Bruised arms and elbow  No skin breakdown  Assess skin for breakdown q shift     *See Care Plan and progress notes for long and short-term goals.    Barriers to Discharge:  Wife is unable to help      Possible Resolutions to Barriers:    caregiver training      Discharge Planning/Teaching Needs:    Home with wife and Gary-caregiver who will need to increase his hours, currently 4-5 hours a day.  Will discuss with son and pt's wife.    Team Discussion:    New eval-gary caregiver/neighbor here to attend therapies with pt.  Adjusting to unit.   Revisions to Treatment Plan:    New eval    Continued Need for Acute Rehabilitation Level of Care: The patient requires daily medical management by a physician with specialized training in physical medicine and rehabilitation for the following conditions: Daily direction of a multidisciplinary physical rehabilitation program to ensure safe treatment while eliciting the highest outcome that is of practical value to the patient.: Yes Daily medical  management of patient stability for increased activity during participation in an intensive rehabilitation regime.: Yes Daily analysis of laboratory values and/or radiology reports with any subsequent need for medication adjustment of medical intervention for : Neurological problems  Elease Hashimoto 09/04/2013, 8:33 AM          Patient ID: Matthew Hodge, male   DOB: 1925/06/30, 80 y.o.   MRN: [id number]

## 2013-09-09 NOTE — Progress Notes (Signed)
Speech Language Pathology Daily Session Note  Patient Details  Name: Matthew Hodge MRN: [id number] Date of Birth: June 04, 1925  Today's Date: 09/09/2013 Time: 3143-8887 Time Calculation (min): 33 min  Short Term Goals: Week 1: SLP Short Term Goal 1 (Week 1): Pt will sustain attention to functional task for 60 seconds with Max cues SLP Short Term Goal 1 - Progress (Week 1): Met SLP Short Term Goal 2 (Week 1): Pt will use environmental clues to demonstrate orientation to time, place, and situation with Max cues SLP Short Term Goal 2 - Progress (Week 1): Met SLP Short Term Goal 3 (Week 1): Pt will communicate basic wants/needs with Max cues SLP Short Term Goal 3 - Progress (Week 1): Progressing toward goal SLP Short Term Goal 4 (Week 1): Pt will use basic problem solving to utilize call bell with Max cues SLP Short Term Goal 4 - Progress (Week 1): Progressing toward goal  Skilled Therapeutic Interventions: Skilled treatment session focused on addressing cognitive goals. Patient was asked to count money and asked what he could purchase for that amount. He needed MOD verbal cues to count the amount. Patient's vision impacted his ability to identified coins. When asked what he could buy for the amount, patient independently listed one object and needed MOD verbal cues to list two more objects. When asked if patient wanted to walk his wheelchair back to his room, he needed visual cues to comprehend the question. Continue to address cognitive-linguistic goals.    FIM:  Comprehension Comprehension Mode: Auditory Comprehension: 2-Understands basic 25 - 49% of the time/requires cueing 51 - 75% of the time Expression Expression Mode: Verbal Expression: 3-Expresses basic 50 - 74% of the time/requires cueing 25 - 50% of the time. Needs to repeat parts of sentences. Social Interaction Social Interaction: 3-Interacts appropriately 50 - 74% of the time - May be physically or verbally  inappropriate. Problem Solving Problem Solving: 2-Solves basic 25 - 49% of the time - needs direction more than half the time to initiate, plan or complete simple activities Memory Memory: 1-Recognizes or recalls less than 25% of the time/requires cueing greater than 75% of the time FIM - Eating Eating Activity: 0: Activity did not occur  Pain Pain Assessment Pain Assessment: No/denies pain  Therapy/Group: Individual Therapy  Jewelz Kobus 09/09/2013, 5:01 PM

## 2013-09-09 NOTE — Progress Notes (Signed)
The skilled treatment note has been reviewed and SLP is in agreement. Denna Fryberger, M.A., CCC-SLP [phone]  

## 2013-09-09 NOTE — Progress Notes (Signed)
Social Work Patient ID: Matthew Hodge, male   DOB: 03-Dec-1924, 80 y.o.   MRN: [id number] Met with pt and [name] to inform of team conference progression toward goals and discharge 2/7.  Discussed pt becoming more anxious and may do better in home environment due to his dementia. Discussed beginning caregiver education with gary to see if he is able to manage pt at home.  He reports he has started today.  But feels he will be able to provide the care pt  Requires.  He would like to have him go to OP therapies instead of Home health due to get him out.  Pt's wife also beginning OP therapies for PT needs.  Will see how education goes and gary is aware of the possibility of moving up pt's discharge date.

## 2013-09-09 NOTE — Progress Notes (Signed)
Physical Therapy Weekly Progress Note  Patient Details  Name: Matthew Hodge MRN: [id number] Date of Birth: Nov 15, 1924  Today's Date: 09/09/2013 Time: 0630-1601 and 0932-3557  Time Calculation (min): 56 min and 43 min  Patient has made slow but steady progress and has met 3 of 3 short term goals.  Pt is currently max A for bed mobility, bed <> w/c transfers, min A for sitting balance but continues to require +2A or use of lift equipment for standing, gait and stair negotiation.  Pt progress is limited by premorbid cognitive impairments, retro and R lateral pushing and anxiety with movement.    Patient continues to demonstrate the following deficits: R hemiparesis, impaired perception of midline with pushing to R and posterior, impaired postural control, balance, gait, cognitive deficits and anxiety with mobility and therefore will continue to benefit from skilled PT intervention to enhance overall performance with activity tolerance, balance, postural control, ability to compensate for deficits and functional use of  right upper extremity and right lower extremity.  Patient progressing toward long term goals..  Continue plan of care.  PT Short Term Goals Week 1:  PT Short Term Goal 1 (Week 1): Pt will perform supine<>sit with max A PT Short Term Goal 1 - Progress (Week 1): Met PT Short Term Goal 2 (Week 1): Pt will transfer bed<>w/c max A +1 PT Short Term Goal 2 - Progress (Week 1): Met PT Short Term Goal 3 (Week 1): Pt will maintain static sitting balance with min A PT Short Term Goal 3 - Progress (Week 1): Met Week 2:  PT Short Term Goal 1 (Week 2): Pt will perform bed mobility with mod A and max verbal cues for sequencing PT Short Term Goal 2 (Week 2): Pt will performed bed <> w/c to L and R with mod A and max verbal cues for sequencing PT Short Term Goal 3 (Week 2): Pt will perform w/c mobility x 100' in controlled environment with bilat feet propulsion with mod A PT Short Term Goal 4  (Week 2): Pt will perform gait with RW x 25' with max A of one person and max verbal cues PT Short Term Goal 5 (Week 2): Pt will ascend/descend 3 stairs with 2 rails with max A of one person  Skilled Therapeutic Interventions/Progress Updates:   Pt still in bed with caregiver assisting with donning pants.  Assisted with bridging to finish pulling pants up with max A.  Performed rolling to L side with mod-max A with verbal and visual cues for reaching and sequencing and max-total A for side > sit secondary to pushing and anxiety with changes in position.  Performed transfers bed > w/c <> mat with squat pivots to L and R with max A of one person with over the back technique to maintain anterior and lateral leaning and weight shifting with extra time to allow pt to initiate squat.  Performed w/c mobility x 75' in controlled environment with bilat foot propulsion with max A and constant verbal and visual cues for RLE extension and flexion.   Performed multiple sit <> stand from mat with UE support on STEADY with max-total A with extra time and total verbal and tactile cues for pushing/extension through RLE and weight shift forwards and to the L.  Only able to come to full stand x 2 but pt extremely anxious and as pt anxiety increased pushing posterior and to the R worsened.  Returned to sitting on STEADY and attempted NMR.  See below  for details.    PM session: pt set up in [place] for gait training.  Performed gait x 125' x 2 reps in [place] with +2 assistance with caregiver guiding SARA down the hall with 2 therapists assisting pt with upright trunk, lateral and anterior weight shifting and max verbal and tactile cues for full RLE clearance, step length bilaterally and intermittent assistance to place RLE to prevent scissoring.  Pt required one sitting rest break. Returned to the room and performed lateral and retro stepping with SARA and +2 A.  Released from [place] once seated on bed and performed sit > supine with mod  A and assisted caregiver with repositioning in bed with bridging/scooting.    Therapy Documentation Precautions:  Precautions Precautions: Fall Restrictions Weight Bearing Restrictions: No Pain: Pain Assessment Pain Assessment: No/denies pain Locomotion : Wheelchair Mobility Distance: 75  Other Treatments: Treatments Neuromuscular Facilitation: Right;Upper Extremity;Lower Extremity;Activity to increase motor control;Activity to increase timing and sequencing;Activity to increase grading;Activity to increase sustained activation;Activity to increase lateral weight shifting;Activity to increase anterior-posterior weight shifting beginning in [place] to start from partially standing position with focus on reaching up, forwards and to the L to place horseshoes on tall target but pt extremely anxious on STEADY and reports feeling like he is falling and unable to cease pushing to weight shift to L.  Transitioned to mat with feet elevated off of floor to minimize pushing but with R foot on block to facilitate pushing through RLE and RUE while reaching up, forwards and to the L for horseshoes and placing them on target to L to facilitate L pelvic depression, trunk elongation and L lateral weight shift with extra time and encouragement for full weight shift/reaching secondary to anxiety.  All throughout session pt verbalizing confusion about purpose of mobility stating, "what did that just prove? Why are we doing this?".  Returned to chair and room to rest before OT.   See FIM for current functional status  Therapy/Group: Individual Therapy  [name] [place] 09/09/2013, 11:30 AM

## 2013-09-09 NOTE — Progress Notes (Signed)
Subjective/Complaints: 80 y.o. right-handed male was documented history of Alzheimer's disease. Patient lives with his wife and used a cane prior to admission. Patient on no scheduled medications prior to admission. Admitted 08/27/2013 with right side weakness and altered mental status. Cranial CT scan showed no acute abnormalities. Patient did receive TPA. MRI of the brain showed small patchy areas of acute infarct in the left parietal lobe. Possible 5 mm acute infarct right parietal white matter. MRA of the head with no large vessel occlusion. Echocardiogram with ejection fraction of 55% and normal systolic function.  Carotid Dopplers with no ICA stenosis. EEG negative for seizure  No new issues overnite Pt feels good Appetite ~100% this am  Review of Systems - incomplete secondary to cognitive issues  Objective: Vital Signs: Blood pressure 133/73, pulse 77, temperature 97.8 F (36.6 C), temperature source Oral, resp. rate 18, height 6' (1.829 m), weight 81.194 kg (179 lb), SpO2 97.00%. No results found. No results found for this or any previous visit (from the past 72 hour(s)).   HEENT: normal Cardio: RRR and no murmurs Resp: CTA B/L and unlabored GI: BS positive and ND, suprapubic cath site intact Extremity:  Pulses positive and No Edema Skin:   Intact Neuro: Confused, Flat, Cranial Nerve II-XII normal, Abnormal Sensory cannot fully assess but grossly intact LT B UE and BLE, Abnormal Motor 4/5 Bilateral delt bi tri grip HF KE ADF and Abnormal FMC Ataxic/ dec FMC Musc/Skel:  Other bilateral shoulder contracture, bilateral hamstrings contracture Gen NAD   Assessment/Plan: 1. Functional deficits secondary to R MCA infarct superimposed on chronic deconditioning which require 3+ hours per day of interdisciplinary therapy in a comprehensive inpatient rehab setting. Physiatrist is providing close team supervision and 24 hour management of active medical problems listed below. Physiatrist  and rehab team continue to assess barriers to discharge/monitor patient progress toward functional and medical goals. FIM: FIM - Bathing Bathing Steps Patient Completed: Chest;Right Arm;Left Arm;Abdomen;Front perineal area;Right upper leg;Left upper leg Bathing: 3: Mod-Patient completes 5-7 49f 10 parts or 50-74%  FIM - Upper Body Dressing/Undressing Upper body dressing/undressing steps patient completed: Thread/unthread right sleeve of pullover shirt/dresss;Thread/unthread left sleeve of pullover shirt/dress Upper body dressing/undressing: 3: Mod-Patient completed 50-74% of tasks FIM - Lower Body Dressing/Undressing Lower body dressing/undressing: 1: Two helpers  FIM - Toileting Toileting: 1: Two helpers  FIM - Radio producer Devices: Recruitment consultant Transfers: 1-Two helpers  FIM - Control and instrumentation engineer Devices: Arm rests Bed/Chair Transfer: 2: Supine > Sit: Max A (lifting assist/Pt. 25-49%);1: Bed > Chair or W/C: Total A (helper does all/Pt. < 25%);1: Two helpers  FIM - Locomotion: Wheelchair Locomotion: Wheelchair: 1: Travels less than 50 ft with minimal assistance (Pt.>75%) FIM - Locomotion: Ambulation Locomotion: Ambulation Assistive Devices: Sara Plus Ambulation/Gait Assistance: 1: +2 Total assist Locomotion: Ambulation: 1: Two helpers  Comprehension Comprehension Mode: Auditory Comprehension: 2-Understands basic 25 - 49% of the time/requires cueing 51 - 75% of the time  Expression Expression Mode: Verbal Expression: 3-Expresses basic 50 - 74% of the time/requires cueing 25 - 50% of the time. Needs to repeat parts of sentences.  Social Interaction Social Interaction: 3-Interacts appropriately 50 - 74% of the time - May be physically or verbally inappropriate.  Problem Solving Problem Solving: 2-Solves basic 25 - 49% of the time - needs direction more than half the time to initiate, plan or complete simple  activities  Memory Memory: 1-Recognizes or recalls less than 25% of the time/requires cueing  greater than 75% of the time  Medical Problem List and Plan:  1. Thrombotic Right MCA infarct  2. DVT Prophylaxis/Anticoagulation: SCDs.Monitor for any signs of DVT.  3. Pain Management: Tylenol as needed  4. Mood/dementia: baseline cognitive deficits prior to CVA. Perhaps some mild improvement just before CVA.  - Bed alarm for safety  - trial of aricept  5. Neuropsych: This patient is not capable of making decisions on his own behalf. 6.  Hx bladder outlet obstruction chronic supr pubic cath  LOS (Days) 8 A FACE TO FACE EVALUATION WAS PERFORMED  KIRSTEINS,ANDREW E 09/09/2013, 7:52 AM

## 2013-09-09 NOTE — Progress Notes (Signed)
Occupational Therapy Weekly Progress Note  Patient Details  Name: Matthew Hodge MRN: [id number] Date of Birth: 01-10-1925  Today's Date: 09/09/2013 Time: 0915-1010 Time Calculation (min): 55 min  Patient has met 1 of 4 short term goals.  Pt is making slow but steady progress towards goals.  Pt's progress is limited due to his tendency for lateropulsion back and to Rt in static sitting and increases with transfers. Pt continues to require assistance of 2 caregivers when completing LB dressing both at bed level and sit> stand as pt requires assist to maintain sidelying or total assist to stand while second person pulls up pants.  Pt fluctuates between max assist and total +2 with squat pivot transfers due to pushing. Pt progress is limited by premorbid cognitive impairments, retro and R lateral pushing and anxiety with movement.   Patient continues to demonstrate the following deficits: Rt hemiparesis, impaired perception of midline with pushing to Rt and posterior, impaired postural control, impaired balance, cognitive deficits and anxiety with mobility and therefore will continue to benefit from skilled OT intervention to enhance overall performance with BADL and Reduce care partner burden.  Patient progressing toward long term goals..  Continue plan of care.  OT Short Term Goals Week 1:  OT Short Term Goal 1 (Week 1): Pt will complete sit <> stand for LB bathing with max assist of one caregiver OT Short Term Goal 1 - Progress (Week 1): Progressing toward goal OT Short Term Goal 2 (Week 1): Pt will complete toilet transfer with max assist of one caregiver OT Short Term Goal 2 - Progress (Week 1): Progressing toward goal OT Short Term Goal 3 (Week 1): Pt will complete UB dressing with mod assist of one caregiver OT Short Term Goal 3 - Progress (Week 1): Met OT Short Term Goal 4 (Week 1): Pt will complete LB drssing with max assist of one caregiver OT Short Term Goal 4 - Progress (Week 1):  Progressing toward goal Week 2:  OT Short Term Goal 1 (Week 2): Pt will complete sit <> stand for LB bathing with max assist of one caregiver OT Short Term Goal 2 (Week 2): Pt will complete toilet transfer with max assist of one caregiver OT Short Term Goal 3 (Week 2): Pt will complete UB dressing with min assist OT Short Term Goal 4 (Week 2): Pt will complete LB dressing with max assist of one caregiver  Skilled Therapeutic Interventions/Progress Updates:    Pt seen for 1:1 OT with focus on trunk control, midline sitting balance, and transfers.  Pt seated at sink shaving with electric razor upon arrival.  Pt's caregiver reports already dressing this AM prior to PT session.  Engaged in dynamic sitting balance on therapy mat in gym with focus on reaching towards ground for tools and then placing them in box to Lt to promote Lt weight shift and decrease Rt lean.  Utilized Geologist, engineering for visual input to promote midline sitting during reaching task.  Engaged in weight shifting to Lt down on elbow to promote increased weight shift as pt would continue to keep Rt trunk flexed while reaching to Lt, hesitant to flex Lt trunk with reaching.  Pt with improved midline sitting balance post weight bearing through Lt elbow.  Pt continues to question purpose of tasks and is easily frustrated.  Performed squat pivot transfers to/from mat with max assist with 2nd person stabilizing w/c, pt with fluctuating participation in transfer.  Therapy Documentation Precautions:  Precautions Precautions: Fall Restrictions Weight  Bearing Restrictions: No Pain:  Pt with no c/o pain this session.  See FIM for current functional status  Therapy/Group: Individual Therapy  Simonne Come 09/09/2013, 10:16 AM

## 2013-09-09 NOTE — Patient Care Conference (Signed)
Inpatient RehabilitationTeam Conference and Plan of Care Update Date: 09/09/2013   Time: 11;45 Am    Patient Name: Matthew Hodge      Medical Record Number: [id number]  Date of Birth: [birth year] Sex: Male         Room/Bed: 4W12C/4W12C-01 Payor Info: Payor: MEDICARE / Plan: MEDICARE PART A AND B / Product Type: *No Product type* /    Admitting Diagnosis: CVA  Admit Date/Time:  09/01/2013  5:10 PM Admission Comments: No comment available   Primary Diagnosis:  <principal problem not specified> Principal Problem: <principal problem not specified>  Patient Active Problem List   Diagnosis Date Noted  . CVA (cerebral infarction) 09/01/2013  . Stroke 08/27/2013  . Alzheimer's disease   . Enlarged prostate with urinary retention 07/15/2012  . Chronic constipation 07/15/2012  . Hyperglycemia 07/15/2012  . Acute renal failure 07/15/2012  . Leukocytosis 07/15/2012  . Normocytic anemia 07/15/2012  . UTI (lower urinary tract infection) 07/15/2012    Expected Discharge Date: Expected Discharge Date: 09/26/13  Team Members Present: Physician leading conference: Dr. Alysia Penna Social Worker Present: Ovidio Kin, LCSW Nurse Present: Other (comment) Nigel Bridgeman) PT Present: [name], PT;Emily Rinaldo Cloud, PT OT Present: Simonne Come, OT;Kris Nira Retort, OT SLP Present: Gunnar Fusi, SLP PPS Coordinator present : Daiva Nakayama, RN, CRRN     Current Status/Progress Goal Weekly Team Focus  Medical   Shoulder and knee contractures improving  Increase mobility and range of motion   coontinue treatment program, initiate caregiver training   Bowel/Bladder   Contient of bowel suprapubic cath   Remain continent of bowel, caregiver able to manage suprapubic cath   Educate on suprapubic cath maintanence   Swallow/Nutrition/ Hydration     [facility]        ADL's   mod assist bathing at bed level, mod assist UB dressing, +2 for LB dressing at sit > stand level, max to +2 for  transfers  min-mod assist overall  self-care retraining, sitting balance, transfers   Mobility   Max-total A  Min-mod A overall  decreasing assistance for transfers, postural control, standing balance, gait   Communication   Mod-Mod assist for initiation   Min assist   increase initiation of wants and needs   Safety/Cognition/ Behavioral Observations  Max assist for orientation, attention to task and basic problem solving  Min assist   increase use of external aids, attention, basic problem solving    Pain   no c/o pain  pain <2  Assess pain q shift and prn and treat as needed   Skin   scattered bruising to BUE; suprapubic cath site split quaze, CDI  No further skin breakdown/infection  Assess skin q shift and prn continue with turn schedule      *See Care Plan and progress notes for long and short-term goals.  Barriers to Discharge: Wife is unable help    Possible Resolutions to Barriers:  Increased hours of caregiver    Discharge Planning/Teaching Needs:  Family aware pt will require 24 hr care, [name] caregiver here daily and participating in pt's care      Team Discussion:  Pt making slow progress, maybe do better in own environment due to dementia.  Will begin caregiver training and see if managable with gary.  Becoming more anxious here.  Revisions to Treatment Plan:  May move up discharge dependent upon caregiver training   Continued Need for Acute Rehabilitation Level of Care: The patient requires daily medical management by a  physician with specialized training in physical medicine and rehabilitation for the following conditions: Daily direction of a multidisciplinary physical rehabilitation program to ensure safe treatment while eliciting the highest outcome that is of practical value to the patient.: Yes Daily medical management of patient stability for increased activity during participation in an intensive rehabilitation regime.: Yes Daily analysis of laboratory values  and/or radiology reports with any subsequent need for medication adjustment of medical intervention for : Neurological problems;Other  [name], [name] 09/09/2013, 3:00 PM

## 2013-09-10 ENCOUNTER — Inpatient Hospital Stay (HOSPITAL_COMMUNITY): Payer: Medicare Other | Admitting: Physical Therapy

## 2013-09-10 ENCOUNTER — Inpatient Hospital Stay (HOSPITAL_COMMUNITY): Payer: Medicare Other | Admitting: Speech Pathology

## 2013-09-10 ENCOUNTER — Inpatient Hospital Stay (HOSPITAL_COMMUNITY): Payer: Medicare Other | Admitting: Occupational Therapy

## 2013-09-10 MED ORDER — BIOTENE DRY MOUTH MT LIQD
15.0000 mL | Freq: Two times a day (BID) | OROMUCOSAL | Status: DC
  Administered 2013-09-10 – 2013-09-26 (×31): 15 mL via OROMUCOSAL

## 2013-09-10 NOTE — Progress Notes (Signed)
Occupational Therapy Session Note  Patient Details  Name: Matthew Hodge MRN: [id number] Date of Birth: October 04, 1924  Today's Date: 09/10/2013 Time: 8676-7209 Time Calculation (min): 60 min  Short Term Goals: Week 2:  OT Short Term Goal 1 (Week 2): Pt will complete sit <> stand for LB bathing with max assist of one caregiver OT Short Term Goal 2 (Week 2): Pt will complete toilet transfer with max assist of one caregiver OT Short Term Goal 3 (Week 2): Pt will complete UB dressing with min assist OT Short Term Goal 4 (Week 2): Pt will complete LB dressing with max assist of one caregiver  Skilled Therapeutic Interventions/Progress Updates:    Pt seen to begin hands on education with self-care tasks and transfer training.  Pt in bed upon arrival, discussed with caregiver a more hands on roll for him during therapy sessions to increase continuity of care due to pt's cognitive deficits.  Bathing completed at bed level with focus on appropriate cues to increase pt's participation, encouraged caregiver to decrease wordy cues and focus on more concise, functional cues.  Pt reports need to have BM, performed squat pivot transfer to drop arm BSC with caregiver providing the cues and the lifting assist while therapist guided as necessary.  Therapist stood with pt while caregiver completed hygiene, pt unable to come to full stand remaining in partial stand/squat position with max-total assist.  Pt returned to bed to complete hygiene and dressing with caregiver and nurse tech due to out of time.  Therapy Documentation Precautions:  Precautions Precautions: Fall Restrictions Weight Bearing Restrictions: No Pain:  Pt with no c/o pain this session.  See FIM for current functional status  Therapy/Group: Individual Therapy  Simonne Come 09/10/2013, 11:17 AM

## 2013-09-10 NOTE — Progress Notes (Signed)
Subjective/Complaints: 80 y.o. right-handed male was documented history of Alzheimer's disease. Patient lives with his wife and used a cane prior to admission. Patient on no scheduled medications prior to admission. Admitted 08/27/2013 with right side weakness and altered mental status. Cranial CT scan showed no acute abnormalities. Patient did receive TPA. MRI of the brain showed small patchy areas of acute infarct in the left parietal lobe. Possible 5 mm acute infarct right parietal white matter. MRA of the head with no large vessel occlusion. Echocardiogram with ejection fraction of 55% and normal systolic function.  Carotid Dopplers with no ICA stenosis. EEG negative for seizure Still does not recodnize me as MD In "hospital home" because he has no home Not aware of CVA diagnosis despite daily reminders  Review of Systems - incomplete secondary to cognitive issues  Objective: Vital Signs: Blood pressure 158/81, pulse 79, temperature 97.6 F (36.4 C), temperature source Oral, resp. rate 18, height 6' (1.829 m), weight 81.194 kg (179 lb), SpO2 97.00%. No results found. No results found for this or any previous visit (from the past 72 hour(s)).   HEENT: normal Cardio: RRR and no murmurs Resp: CTA B/L and unlabored GI: BS positive and ND, suprapubic cath site intact Extremity:  Pulses positive and No Edema Skin:   Intact Neuro: Confused, Flat, Cranial Nerve II-XII normal, Abnormal Sensory cannot fully assess but grossly intact LT B UE and BLE, Abnormal Motor 4/5 Bilateral delt bi tri grip HF KE ADF and Abnormal FMC Ataxic/ dec FMC Musc/Skel:  Other bilateral shoulder contracture, bilateral hamstrings contracture Gen NAD   Assessment/Plan: 1. Functional deficits secondary to R MCA infarct superimposed on chronic deconditioning which require 3+ hours per day of interdisciplinary therapy in a comprehensive inpatient rehab setting. Physiatrist is providing close team supervision and 24 hour  management of active medical problems listed below. Physiatrist and rehab team continue to assess barriers to discharge/monitor patient progress toward functional and medical goals. Minimal gains in therapy, will need to shorten LOS FIM: FIM - Bathing Bathing Steps Patient Completed: Chest;Right Arm;Left Arm;Abdomen;Front perineal area;Right upper leg;Left upper leg Bathing: 3: Mod-Patient completes 5-7 56f 10 parts or 50-74%  FIM - Upper Body Dressing/Undressing Upper body dressing/undressing steps patient completed: Thread/unthread right sleeve of pullover shirt/dresss;Thread/unthread left sleeve of pullover shirt/dress Upper body dressing/undressing: 3: Mod-Patient completed 50-74% of tasks FIM - Lower Body Dressing/Undressing Lower body dressing/undressing: 1: Two helpers  FIM - Toileting Toileting: 1: Two helpers  FIM - Radio producer Devices: Recruitment consultant Transfers: 1-Two helpers  FIM - Control and instrumentation engineer Devices: Arm rests;Bed rails Bed/Chair Transfer: 2: Supine > Sit: Max A (lifting assist/Pt. 25-49%);2: Bed > Chair or W/C: Max A (lift and lower assist);2: Chair or W/C > Bed: Max A (lift and lower assist)  FIM - Locomotion: Wheelchair Distance: 75 Locomotion: Wheelchair: 2: Travels 50 - 149 ft with maximal assistance (Pt: 25 - 49%) FIM - Locomotion: Ambulation Locomotion: Ambulation Assistive Devices: Sara Plus Ambulation/Gait Assistance: 1: +2 Total assist Locomotion: Ambulation: 0: Activity did not occur  Comprehension Comprehension Mode: Auditory Comprehension: 2-Understands basic 25 - 49% of the time/requires cueing 51 - 75% of the time  Expression Expression Mode: Verbal Expression: 3-Expresses basic 50 - 74% of the time/requires cueing 25 - 50% of the time. Needs to repeat parts of sentences.  Social Interaction Social Interaction: 3-Interacts appropriately 50 - 74% of the time - May be  physically or verbally inappropriate.  Problem Solving Problem Solving:  2-Solves basic 25 - 49% of the time - needs direction more than half the time to initiate, plan or complete simple activities  Memory Memory: 1-Recognizes or recalls less than 25% of the time/requires cueing greater than 75% of the time  Medical Problem List and Plan:  1. Thrombotic Right MCA infarct  2. DVT Prophylaxis/Anticoagulation: SCDs.Monitor for any signs of DVT.  3. Pain Management: Tylenol as needed  4. Mood/dementia: baseline cognitive deficits prior to CVA. Perhaps some mild improvement just before CVA.  - Bed alarm for safety  - trial of aricept, thus far no effect noted  5. Neuropsych: This patient is not capable of making decisions on his own behalf. 6.  Hx bladder outlet obstruction chronic supr pubic cath  LOS (Days) 9 A FACE TO FACE EVALUATION WAS PERFORMED  KIRSTEINS,ANDREW E 09/10/2013, 6:43 AM

## 2013-09-10 NOTE — Progress Notes (Signed)
Physical Therapy Session Note  Patient Details  Name: Matthew Hodge MRN: [id number] Date of Birth: 03/13/1925  Today's Date: 09/10/2013 Time: 1103-1201 and 1324-4010 Time Calculation (min): 58 min and 40 min   Short Term Goals: Week 2:  PT Short Term Goal 1 (Week 2): Pt will perform bed mobility with mod A and max verbal cues for sequencing PT Short Term Goal 2 (Week 2): Pt will performed bed <> w/c to L and R with mod A and max verbal cues for sequencing PT Short Term Goal 3 (Week 2): Pt will perform w/c mobility x 100' in controlled environment with bilat feet propulsion with mod A PT Short Term Goal 4 (Week 2): Pt will perform gait with RW x 25' with max A of one person and max verbal cues PT Short Term Goal 5 (Week 2): Pt will ascend/descend 3 stairs with 2 rails with max A of one person  Skilled Therapeutic Interventions/Progress Updates:   Pt still in bed with caregiver present.  Caregiver reports assisting with bathing and dressing and transfers this am.  Pt performed rolling to L side with min-mod A and side > sit with mod A with use of reaching to assist with lateral weight shifting.  Had pt assist with donning shoes EOB to facilitate increased flexion at trunk.  Caregiver performed lateral scoot transfers with pt to L and R bed > w/c <> mat with caregiver providing max A and therapist observing and providing verbal cues to caregiver and pt regarding hand placement, lateral leaning and weight shifting to minimize pushing and maximize use of head/hips relationship for more efficient transfer.  On mat attempted to perform low back stretch with blanket around low back for increased anterior pelvic rotation and lumbar lordosis to assist with forward weight shift during transfers with R pelvis wedged for trunk shortening and pt reaching forwards to facilitate forward weight shift over BOS.  Pt very uncomfortable with position and reported pain in hips with stretching and became very upset  with activity stating, "it ain't worth a damn!".  Allow pt time to calm down and provided pt with water.  Performed ankle strengthening and training with feet on pink balance air disc performing multiple ankle DF <> PF on unstable surface x 30 reps each side.  Pt did demonstrate improved forwards and lateral leaning with transfer back to w/c. Pt returned to room with caregiver for lunch.    PM session: pt up in w/c with caregiver present.  Transported to gym in w/c total A.  Performed transfer w/c > Nustep to R with max A.  Pt set up on Nustep and performed bilat UE and LE strengthening, endurance, AROM and coordination at level 5 resistance x 8 minutes with one rest break.  While on Nustep pt required tactile cues to prevent RLE ER and to encourage R trunk shortening and L pelvic depression and weight shift.  Pt reported enjoying using Nustep for exercise.  When returned to w/c pt became very disoriented and confused and felt that he was falling to the L and began to push very strongly to R and off of Nustep chair.  Required total A to return to safe sitting position.  Performed transfers Nustep > w/c > bed with caregiver max A squat pivot.  Performed sit > supine and repositioning with bridging with max A.    Therapy Documentation Precautions:  Precautions Precautions: Fall Restrictions Weight Bearing Restrictions: No Pain: Pain Assessment Pain Assessment: No/denies pain  See  FIM for current functional status  Therapy/Group: Individual Therapy  [name] [facility] 09/10/2013, 12:45 PM

## 2013-09-10 NOTE — Progress Notes (Signed)
Speech Language Pathology Daily Session Note  Patient Details  Name: Matthew Hodge MRN: [id number] Date of Birth: 24-Jul-1925  Today's Date: 09/10/2013 Time: 3833-3832 Time Calculation (min): 30 min  Short Term Goals: Week 1: SLP Short Term Goal 1 (Week 1): Pt will sustain attention to functional task for 60 seconds with Max cues SLP Short Term Goal 1 - Progress (Week 1): Met SLP Short Term Goal 2 (Week 1): Pt will use environmental clues to demonstrate orientation to time, place, and situation with Max cues SLP Short Term Goal 2 - Progress (Week 1): Met SLP Short Term Goal 3 (Week 1): Pt will communicate basic wants/needs with Max cues SLP Short Term Goal 3 - Progress (Week 1): Progressing toward goal SLP Short Term Goal 4 (Week 1): Pt will use basic problem solving to utilize call bell with Max cues SLP Short Term Goal 4 - Progress (Week 1): Progressing toward goal  Skilled Therapeutic Interventions: Skilled treatment session focused on addressing cognitive goals. Patient was asked to sort blocks by color. Patient independently sustained attention for duration of the task and was able to independently select attention when environment became noisy. He needed MAX verbal cues to use his both hands when sorting shapes. He also needed MAX verbal cues to sit up and not lean to his right side. Therapist and caregiver discussed treatment options to maximize time with PT and OT.    FIM:  Comprehension Comprehension Mode: Auditory Comprehension: 2-Understands basic 25 - 49% of the time/requires cueing 51 - 75% of the time Expression Expression Mode: Verbal Expression: 3-Expresses basic 50 - 74% of the time/requires cueing 25 - 50% of the time. Needs to repeat parts of sentences. Social Interaction Social Interaction: 4-Interacts appropriately 75 - 89% of the time - Needs redirection for appropriate language or to initiate interaction. Problem Solving Problem Solving: 2-Solves basic 25 -  49% of the time - needs direction more than half the time to initiate, plan or complete simple activities Memory Memory: 1-Recognizes or recalls less than 25% of the time/requires cueing greater than 75% of the time FIM - Eating Eating Activity: 0: Activity did not occur  Pain Pain Assessment Pain Assessment: No/denies pain  Therapy/Group: Individual Therapy  Sander Speckman 09/10/2013, 4:47 PM

## 2013-09-10 NOTE — Progress Notes (Signed)
The skilled treatment note has been reviewed and SLP is in agreement. Jossalin Chervenak, M.A., CCC-SLP [phone]  

## 2013-09-11 ENCOUNTER — Inpatient Hospital Stay (HOSPITAL_COMMUNITY): Payer: Medicare Other | Admitting: Occupational Therapy

## 2013-09-11 ENCOUNTER — Inpatient Hospital Stay (HOSPITAL_COMMUNITY): Payer: Medicare Other | Admitting: Physical Therapy

## 2013-09-11 ENCOUNTER — Inpatient Hospital Stay (HOSPITAL_COMMUNITY): Payer: Medicare Other | Admitting: Speech Pathology

## 2013-09-11 DIAGNOSIS — R339 Retention of urine, unspecified: Secondary | ICD-10-CM

## 2013-09-11 DIAGNOSIS — F0390 Unspecified dementia without behavioral disturbance: Secondary | ICD-10-CM

## 2013-09-11 DIAGNOSIS — I6350 Cerebral infarction due to unspecified occlusion or stenosis of unspecified cerebral artery: Secondary | ICD-10-CM

## 2013-09-11 NOTE — Progress Notes (Signed)
Subjective/Complaints: 80 y.o. right-handed male was documented history of Alzheimer's disease. Patient lives with his wife and used a cane prior to admission. Patient on no scheduled medications prior to admission. Admitted 08/27/2013 with right side weakness and altered mental status. Cranial CT scan showed no acute abnormalities. Patient did receive TPA. MRI of the brain showed small patchy areas of acute infarct in the left parietal lobe. Possible 5 mm acute infarct right parietal white matter. MRA of the head with no large vessel occlusion. Echocardiogram with ejection fraction of 55% and normal systolic function.   Oriented to person, picks out hospital from list  Review of Systems - incomplete secondary to cognitive issues  Objective: Vital Signs: Blood pressure 151/78, pulse 72, temperature 97.7 F (36.5 C), temperature source Oral, resp. rate 18, height 6' (1.829 m), weight 81.194 kg (179 lb), SpO2 99.00%. No results found. No results found for this or any previous visit (from the past 72 hour(s)).   HEENT: normal Cardio: RRR and no murmurs Resp: CTA B/L and unlabored GI: BS positive and ND, suprapubic cath site intact Extremity:  Pulses positive and No Edema Skin:   Intact Neuro: Confused, Flat, Cranial Nerve II-XII normal, Abnormal Sensory cannot fully assess but grossly intact LT B UE and BLE, Abnormal Motor 4/5 Bilateral delt bi tri grip HF KE ADF and Abnormal FMC Ataxic/ dec FMC Musc/Skel:  Other bilateral shoulder contracture, bilateral hamstrings contracture Gen NAD   Assessment/Plan: 1. Functional deficits secondary to R MCA infarct superimposed on chronic deconditioning which require 3+ hours per day of interdisciplinary therapy in a comprehensive inpatient rehab setting. Physiatrist is providing close team supervision and 24 hour management of active medical problems listed below. Physiatrist and rehab team continue to assess barriers to discharge/monitor patient  progress toward functional and medical goals. Minimal gains in therapy, will need to shorten LOS FIM: FIM - Bathing Bathing Steps Patient Completed: Chest;Right Arm;Left Arm;Abdomen;Front perineal area;Right upper leg;Left upper leg Bathing: 3: Mod-Patient completes 5-7 14f 10 parts or 50-74%  FIM - Upper Body Dressing/Undressing Upper body dressing/undressing steps patient completed: Thread/unthread right sleeve of pullover shirt/dresss;Thread/unthread left sleeve of pullover shirt/dress Upper body dressing/undressing: 3: Mod-Patient completed 50-74% of tasks FIM - Lower Body Dressing/Undressing Lower body dressing/undressing: 1: Two helpers  FIM - Toileting Toileting: 1: Two helpers  FIM - Radio producer Devices: Recruitment consultant Transfers: 1-Two helpers  FIM - Control and instrumentation engineer Devices: Bed rails;Arm rests Bed/Chair Transfer: 2: Supine > Sit: Max A (lifting assist/Pt. 25-49%);2: Chair or W/C > Bed: Max A (lift and lower assist);2: Bed > Chair or W/C: Max A (lift and lower assist)  FIM - Locomotion: Wheelchair Distance: 75 Locomotion: Wheelchair: 0: Activity did not occur FIM - Locomotion: Ambulation Locomotion: Ambulation Assistive Devices: Sara Plus Ambulation/Gait Assistance: 1: +2 Total assist Locomotion: Ambulation: 0: Activity did not occur  Comprehension Comprehension Mode: Auditory Comprehension: 2-Understands basic 25 - 49% of the time/requires cueing 51 - 75% of the time  Expression Expression Mode: Verbal Expression: 3-Expresses basic 50 - 74% of the time/requires cueing 25 - 50% of the time. Needs to repeat parts of sentences.  Social Interaction Social Interaction: 4-Interacts appropriately 75 - 89% of the time - Needs redirection for appropriate language or to initiate interaction.  Problem Solving Problem Solving: 2-Solves basic 25 - 49% of the time - needs direction more than half the time to  initiate, plan or complete simple activities  Memory Memory: 1-Recognizes or recalls  less than 25% of the time/requires cueing greater than 75% of the time  Medical Problem List and Plan:  1. Thrombotic Right MCA infarct  2. DVT Prophylaxis/Anticoagulation: SCDs.Monitor for any signs of DVT.  3. Pain Management: Tylenol as needed  4. 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 Worsening after bilateral parietal infarcts - Bed alarm for safety  - trial of aricept, thus far no effect noted  5. Neuropsych: This patient is not capable of making decisions on his own behalf. 6.  Hx bladder outlet obstruction chronic supr pubic cath  LOS (Days) 10 A FACE TO FACE EVALUATION WAS PERFORMED  Puneet Selden E 09/11/2013, 7:00 AM

## 2013-09-11 NOTE — Progress Notes (Signed)
Occupational Therapy Session Note  Patient Details  Name: Matthew Hodge MRN: [id number] Date of Birth: [birth year]  Today's Date: 09/11/2013 Time: 3825-0539 Time Calculation (min): 60 min  Short Term Goals: Week 2:  OT Short Term Goal 1 (Week 2): Pt will complete sit <> stand for LB bathing with max assist of one caregiver OT Short Term Goal 2 (Week 2): Pt will complete toilet transfer with max assist of one caregiver OT Short Term Goal 3 (Week 2): Pt will complete UB dressing with min assist OT Short Term Goal 4 (Week 2): Pt will complete LB dressing with max assist of one caregiver  Skilled Therapeutic Interventions/Progress Updates:    Pt seen for ADL retraining with focus on increasing participation in functional tasks of bathing and dressing.  Pt received up in w/c.  Discussed with caregiver plan to engage in bathing in room shower to allow for more functional context to allow increased sequencing and participation.  Completed sit > stand from w/c with use of grab bar and performed stand step transfer into walk-in shower to shower chair with max assist and cues for sidestepping with RUE. Pt with increased sequencing with bathing in more familiar environment, requiring max assist to maintain standing to wash buttocks due to pt leaning posterior and Rt in standing.  Pt with increased awareness of decreased sitting balance with pt reporting "I feel wobbly" in unsupported sitting.  Engaged in [place] with pt able to cross RLE over knee to assist with LB dressing due to instability leaning forward and fearfulness.  Pt able to thread BLE through pants this session with increased encouragement.  Max assist sit > stand at sink with UE support on sink, use of mirror to promote upright standing with 2nd person to pull pants over hips.  Therapy Documentation Precautions:  Precautions Precautions: Fall Restrictions Weight Bearing Restrictions: No Pain:  Pt with no c/o pain this  session.  See FIM for current functional status  Therapy/Group: Individual Therapy  Simonne Come 09/11/2013, 12:10 PM

## 2013-09-11 NOTE — Progress Notes (Signed)
Physical Therapy Session Note  Patient Details  Name: Matthew Hodge MRN: [id number] Date of Birth: [birth year]  Today's Date: 09/11/2013 Time: 0900-0950 and 1500-1605 Time Calculation (min): 50 min and 62  Short Term Goals: Week 1:  PT Short Term Goal 1 (Week 1): Pt will perform supine<>sit with max A PT Short Term Goal 1 - Progress (Week 1): Met PT Short Term Goal 2 (Week 1): Pt will transfer bed<>w/c max A +1 PT Short Term Goal 2 - Progress (Week 1): Met PT Short Term Goal 3 (Week 1): Pt will maintain static sitting balance with min A PT Short Term Goal 3 - Progress (Week 1): Met Week 2:  PT Short Term Goal 1 (Week 2): Pt will perform bed mobility with mod A and max verbal cues for sequencing PT Short Term Goal 2 (Week 2): Pt will performed bed <> w/c to L and R with mod A and max verbal cues for sequencing PT Short Term Goal 3 (Week 2): Pt will perform w/c mobility x 100' in controlled environment with bilat feet propulsion with mod A PT Short Term Goal 4 (Week 2): Pt will perform gait with RW x 25' with max A of one person and max verbal cues PT Short Term Goal 5 (Week 2): Pt will ascend/descend 3 stairs with 2 rails with max A of one person  Skilled Therapeutic Interventions/Progress Updates:   Pt seated EOB with caregiver providing SBA.  Performed sit <> stand from EOB x 2 with +2A (3 Musketeers) with verbal cues, manual facilitation and extra time for full extension through RLE and weight shift to L.  Performed gait x 75' with +2A (3 musketeers) with verbal, visual cues for RLE full step length and wider BOS with intermittent manual facilitation for RLE ABD during swing to prevent scissoring.  Pt reporting need to have BM; ambulated to bathroom in hallway with +2 A and performed toileting with +2 A and UE support on grab bar with assistance to maintain RLE position and extension and verbal cues for L lateral weight shift while second person performed hygiene and clothing management.   Transported to gym in w/c.  Performed stair negotiation with +2 A and UE support on both rails with step to sequence with pt choosing to lead with RLE to ascend and descend with assistance to fully advance RLE, stabilization of RLE in stance and L lateral weight shift in order to advance RLE.  Performed w/c mobility training with bilat foot propulsion x 25' with verbal and visual cues for full R and LLE extension and flexion.   PM session: Caregiver present.  Focus of treatment session on bilat UE and LE strengthening, ROM, endurance and coordination on Nustep x 10 minutes at level 6 resistance.  Also focused on functional transfer training w/c <> car with stand pivot with bilat UE support on car door and handle with +2 A secondary to significant increase in pushing in standing (caregiver providing assistance on R side) to assist with placement of feet and lateral weight shifting to allow RLE advancement and stabilization of R knee to allow LLE advancement during pivoting.  Returned to room and caregiver demonstrated safe w/c > bed squat pivot transfer and supine > sit and repositioning in bed providing max A.  Caregiver checked off to perform lateral scooting bed <> w/c on safety sheet.       Therapy Documentation Precautions:  Precautions Precautions: Fall Restrictions Weight Bearing Restrictions: No Pain: Pain Assessment Pain Assessment: No/denies  pain Locomotion : Ambulation Ambulation/Gait Assistance: 1: +2 Total assist Wheelchair Mobility Distance: 25   See FIM for current functional status  Therapy/Group: Individual Therapy  [name] [facility] 09/11/2013, 12:26 PM

## 2013-09-11 NOTE — Progress Notes (Signed)
Speech Language Pathology Daily Session Note  Patient Details  Name: Matthew Hodge MRN: [id number] Date of Birth: 09/04/1924  Today's Date: 09/11/2013 Time: 1435-1500 Time Calculation (min): 25 min  Short Term Goals: Week 2: SLP Short Term Goal 1 (Week 2): Pt will sustain attention to functional task for 2 minutes with Max cues SLP Short Term Goal 2 (Week 2): Pt will use environmental clues to demonstrate orientation to time and place with Max cues SLP Short Term Goal 3 (Week 2): Pt will communicate basic wants/needs with Max cues SLP Short Term Goal 4 (Week 2): Pt will use basic problem solving to utilize call bell with Max cues  Skilled Therapeutic Interventions: Skilled treatment session focused on addressing cognitive goals. Patient was asked to create 3-D shape from pipes, which required bilateral upper extremity use; patient required Mod verbal cues for verbal and functional problem solving during completion of task.   FIM:  Comprehension Comprehension Mode: Auditory Comprehension: 3-Understands basic 50 - 74% of the time/requires cueing 25 - 50%  of the time Expression Expression Mode: Verbal Expression: 4-Expresses basic 75 - 89% of the time/requires cueing 10 - 24% of the time. Needs helper to occlude trach/needs to repeat words. Social Interaction Social Interaction: 4-Interacts appropriately 75 - 89% of the time - Needs redirection for appropriate language or to initiate interaction. Problem Solving Problem Solving: 3-Solves basic 50 - 74% of the time/requires cueing 25 - 49% of the time Memory Memory: 1-Recognizes or recalls less than 25% of the time/requires cueing greater than 75% of the time FIM - Eating Eating Activity: 5: Set-up assist for open containers  Pain Pain Assessment Pain Assessment: No/denies pain  Therapy/Group: Individual Therapy  Carmelia Roller., CCC-SLP [phone]  [place] 09/11/2013, 4:38 PM

## 2013-09-12 ENCOUNTER — Inpatient Hospital Stay (HOSPITAL_COMMUNITY): Payer: Medicare Other | Admitting: Physical Therapy

## 2013-09-12 DIAGNOSIS — I6350 Cerebral infarction due to unspecified occlusion or stenosis of unspecified cerebral artery: Secondary | ICD-10-CM

## 2013-09-12 DIAGNOSIS — R339 Retention of urine, unspecified: Secondary | ICD-10-CM

## 2013-09-12 DIAGNOSIS — F0390 Unspecified dementia without behavioral disturbance: Secondary | ICD-10-CM

## 2013-09-12 NOTE — Progress Notes (Signed)
Physical Therapy Note  Patient Details  Name: JIMMIE RUETER MRN: [id number] Date of Birth: 11-02-1924 Today's Date: 09/12/2013  1000 -1040 (40 minutes) individual Pain: no reported pain Focus of treatment: wc mobility training; sit to stand / standing alignment Treatment: Pt up in wc upon arrival; wc mobility - 120 feet max assist using all 4 extremities,  therapist assist RT LE with theraband to ; sit to stand in parallel bars x 3 with pt using bars to assist sit to stand (mod max assist) ; pt leans to right in standing but improved with assist to attain right knee extension in stance; pt tolerated standing for approximately 2 minutes or less before c/o fatigue.    Iysis Germain,JIM 09/12/2013, 10:39 AM

## 2013-09-12 NOTE — Progress Notes (Signed)
Subjective/Complaints: 80 y.o. right-handed male was documented history of Alzheimer's disease. Patient lives with his wife and used a cane prior to admission. Patient on no scheduled medications prior to admission. Admitted 08/27/2013 with right side weakness and altered mental status. Cranial CT scan showed no acute abnormalities. Patient did receive TPA. MRI of the brain showed small patchy areas of acute infarct in the left parietal lobe. Possible 5 mm acute infarct right parietal white matter. MRA of the head with no large vessel occlusion. Echocardiogram with ejection fraction of 55% and normal systolic function.   Pleasantly confused. No new problems reported by RN. Appears comfortable  Review of Systems - incomplete secondary to cognitive issues  Objective: Vital Signs: Blood pressure 172/77, pulse 73, temperature 97.7 F (36.5 C), temperature source Oral, resp. rate 18, height 6' (1.829 m), weight 81.194 kg (179 lb), SpO2 96.00%. No results found. No results found for this or any previous visit (from the past 72 hour(s)).   HEENT: normal Cardio: RRR and no murmurs Resp: CTA B/L and unlabored GI: BS positive and ND, suprapubic cath site intact/clean Extremity:  Pulses positive and No Edema Skin:   Intact Neuro: Confused, Flat, Cranial Nerve II-XII normal, Abnormal Sensory cannot fully assess but grossly intact LT B UE and BLE, Abnormal Motor 4/5 Bilateral delt bi tri grip HF KE ADF and Abnormal FMC Ataxic/ dec FMC Musc/Skel:  Other bilateral shoulder contracture, bilateral hamstrings contracture Gen NAD   Assessment/Plan: 1. Functional deficits secondary to R MCA infarct superimposed on chronic deconditioning which require 3+ hours per day of interdisciplinary therapy in a comprehensive inpatient rehab setting. Physiatrist is providing close team supervision and 24 hour management of active medical problems listed below. Physiatrist and rehab team continue to assess barriers to  discharge/monitor patient progress toward functional and medical goals. Minimal gains in therapy, will need to shorten LOS FIM: FIM - Bathing Bathing Steps Patient Completed: Chest;Right Arm;Left Arm;Abdomen;Front perineal area;Right upper leg;Left upper leg Bathing: 3: Mod-Patient completes 5-7 60f 10 parts or 50-74%  FIM - Upper Body Dressing/Undressing Upper body dressing/undressing steps patient completed: Thread/unthread right sleeve of pullover shirt/dresss;Thread/unthread left sleeve of pullover shirt/dress;Put head through opening of pull over shirt/dress Upper body dressing/undressing: 4: Min-Patient completed 75 plus % of tasks FIM - Lower Body Dressing/Undressing Lower body dressing/undressing steps patient completed: Thread/unthread right pants leg;Thread/unthread left pants leg;Fasten/unfasten right shoe;Fasten/unfasten left shoe Lower body dressing/undressing: 1: Two helpers  FIM - Toileting Toileting: 1: Two helpers  FIM - Radio producer Devices: Recruitment consultant Transfers: 1-Two helpers  FIM - Control and instrumentation engineer Devices: Bed rails;Arm rests Bed/Chair Transfer: 1: Two helpers  FIM - Locomotion: Wheelchair Distance: 25 Locomotion: Wheelchair: 1: Travels less than 50 ft with moderate assistance (Pt: 50 - 74%) FIM - Locomotion: Ambulation Locomotion: Ambulation Assistive Devices: Sara Plus Ambulation/Gait Assistance: 1: +2 Total assist Locomotion: Ambulation: 1: Two helpers  Comprehension Comprehension Mode: Auditory Comprehension: 3-Understands basic 50 - 74% of the time/requires cueing 25 - 50%  of the time  Expression Expression Mode: Verbal Expression: 4-Expresses basic 75 - 89% of the time/requires cueing 10 - 24% of the time. Needs helper to occlude trach/needs to repeat words.  Social Interaction Social Interaction: 4-Interacts appropriately 75 - 89% of the time - Needs redirection for  appropriate language or to initiate interaction.  Problem Solving Problem Solving: 3-Solves basic 50 - 74% of the time/requires cueing 25 - 49% of the time  Memory Memory: 1-Recognizes or  recalls less than 25% of the time/requires cueing greater than 75% of the time  Medical Problem List and Plan:  1. Thrombotic Right MCA infarct  2. DVT Prophylaxis/Anticoagulation: SCDs.Monitor for any signs of DVT.  3. Pain Management: Tylenol as needed  4. 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 Worsening after bilateral parietal infarcts - Bed alarm for safety  - trial of aricept with minimal effect 5. Neuropsych: This patient is not capable of making decisions on his own behalf. 6.  Hx bladder outlet obstruction chronic supr pubic cath  LOS (Days) 11 A FACE TO FACE EVALUATION WAS PERFORMED  SWARTZ,ZACHARY T 09/12/2013, 9:06 AM

## 2013-09-13 NOTE — Progress Notes (Signed)
Subjective/Complaints: 80 y.o. right-handed male was documented history of Alzheimer's disease. Patient lives with his wife and used a cane prior to admission. Patient on no scheduled medications prior to admission. Admitted 08/27/2013 with right side weakness and altered mental status. Cranial CT scan showed no acute abnormalities. Patient did receive TPA. MRI of the brain showed small patchy areas of acute infarct in the left parietal lobe. Possible 5 mm acute infarct right parietal white matter. MRA of the head with no large vessel occlusion. Echocardiogram with ejection fraction of 55% and normal systolic function.   Awake and alert. Denies any problems.   Review of Systems - incomplete secondary to cognitive issues  Objective: Vital Signs: Blood pressure 125/68, pulse 81, temperature 97.9 F (36.6 C), temperature source Oral, resp. rate 18, height 6' (1.829 m), weight 81.194 kg (179 lb), SpO2 95.00%. No results found. No results found for this or any previous visit (from the past 72 hour(s)).   HEENT: normal Cardio: RRR and no murmurs Resp: CTA B/L and unlabored GI: BS positive and ND, suprapubic cath site intact/clean Extremity:  Pulses positive and No Edema Skin:   Intact Neuro: pleasantly confused, Cranial Nerve II-XII normal, Abnormal Sensory cannot fully assess but grossly intact LT B UE and BLE, Abnormal Motor 4/5 Bilateral delt bi tri grip HF KE ADF and Abnormal FMC Ataxic/ dec FMC Musc/Skel:  Other bilateral shoulder contracture, bilateral hamstrings contracture Gen NAD   Assessment/Plan: 1. Functional deficits secondary to R MCA infarct superimposed on chronic deconditioning which require 3+ hours per day of interdisciplinary therapy in a comprehensive inpatient rehab setting. Physiatrist is providing close team supervision and 24 hour management of active medical problems listed below. Physiatrist and rehab team continue to assess barriers to discharge/monitor patient  progress toward functional and medical goals. Minimal gains in therapy, will need to shorten LOS FIM: FIM - Bathing Bathing Steps Patient Completed: Chest;Right Arm;Left Arm;Abdomen;Front perineal area;Right upper leg;Left upper leg Bathing: 3: Mod-Patient completes 5-7 85f 10 parts or 50-74%  FIM - Upper Body Dressing/Undressing Upper body dressing/undressing steps patient completed: Thread/unthread right sleeve of pullover shirt/dresss;Thread/unthread left sleeve of pullover shirt/dress;Put head through opening of pull over shirt/dress Upper body dressing/undressing: 4: Min-Patient completed 75 plus % of tasks FIM - Lower Body Dressing/Undressing Lower body dressing/undressing steps patient completed: Thread/unthread right pants leg;Thread/unthread left pants leg;Fasten/unfasten right shoe;Fasten/unfasten left shoe Lower body dressing/undressing: 1: Two helpers  FIM - Toileting Toileting: 1: Two helpers  FIM - Radio producer Devices: Recruitment consultant Transfers: 1-Two helpers  FIM - Control and instrumentation engineer Devices: Bed rails;Arm rests Bed/Chair Transfer: 1: Two helpers  FIM - Locomotion: Wheelchair Distance: 25 Locomotion: Wheelchair: 1: Travels less than 50 ft with moderate assistance (Pt: 50 - 74%) FIM - Locomotion: Ambulation Locomotion: Ambulation Assistive Devices: Sara Plus Ambulation/Gait Assistance: 1: +2 Total assist Locomotion: Ambulation: 1: Two helpers  Comprehension Comprehension Mode: Auditory Comprehension: 3-Understands basic 50 - 74% of the time/requires cueing 25 - 50%  of the time  Expression Expression Mode: Verbal Expression: 4-Expresses basic 75 - 89% of the time/requires cueing 10 - 24% of the time. Needs helper to occlude trach/needs to repeat words.  Social Interaction Social Interaction: 4-Interacts appropriately 75 - 89% of the time - Needs redirection for appropriate language or to  initiate interaction.  Problem Solving Problem Solving: 3-Solves basic 50 - 74% of the time/requires cueing 25 - 49% of the time  Memory Memory: 1-Recognizes or recalls less than  25% of the time/requires cueing greater than 75% of the time  Medical Problem List and Plan:  1. Thrombotic Right MCA infarct  2. DVT Prophylaxis/Anticoagulation: SCDs.Monitor for any signs of DVT.  3. Pain Management: Tylenol as needed  4. 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 Worsening after bilateral parietal infarcts - Bed alarm for safety  - trial of aricept with minimal effect 5. Neuropsych: This patient is not capable of making decisions on his own behalf. 6.  Hx bladder outlet obstruction chronic supr pubic cath  LOS (Days) 12 A FACE TO FACE EVALUATION WAS PERFORMED  SWARTZ,ZACHARY T 09/13/2013, 8:24 AM

## 2013-09-14 ENCOUNTER — Inpatient Hospital Stay (HOSPITAL_COMMUNITY): Payer: Medicare Other | Admitting: Physical Therapy

## 2013-09-14 ENCOUNTER — Inpatient Hospital Stay (HOSPITAL_COMMUNITY): Payer: Medicare Other | Admitting: Occupational Therapy

## 2013-09-14 DIAGNOSIS — I6350 Cerebral infarction due to unspecified occlusion or stenosis of unspecified cerebral artery: Secondary | ICD-10-CM

## 2013-09-14 DIAGNOSIS — F0390 Unspecified dementia without behavioral disturbance: Secondary | ICD-10-CM

## 2013-09-14 DIAGNOSIS — R339 Retention of urine, unspecified: Secondary | ICD-10-CM

## 2013-09-14 NOTE — Progress Notes (Signed)
Occupational Therapy Session Note  Patient Details  Name: Matthew Hodge MRN: [id number] Date of Birth: 08-21-1924  Today's Date: 09/14/2013 Time: 1005-1100 and 1300-1330 Time Calculation (min): 55 min and 30 min  Short Term Goals: Week 2:  OT Short Term Goal 1 (Week 2): Pt will complete sit <> stand for LB bathing with max assist of one caregiver OT Short Term Goal 2 (Week 2): Pt will complete toilet transfer with max assist of one caregiver OT Short Term Goal 3 (Week 2): Pt will complete UB dressing with min assist OT Short Term Goal 4 (Week 2): Pt will complete LB dressing with max assist of one caregiver  Skilled Therapeutic Interventions/Progress Updates:    1) Pt seen for ADL retraining with focus on increasing participation in functional tasks of bathing and dressing. Pt received up in w/c.Completed sit > stand from w/c with use of grab bar and performed stand step transfer into walk-in shower to shower chair with max assist and cues for sidestepping with RUE. Pt with increased sequencing with bathing in more familiar environment, requiring max assist to maintain standing to wash buttocks due to pt leaning posterior and Rt in standing. Pt required total assist transfer out of shower secondary to going towards Rt (leaning to Rt and unable to lift RLE with current posture).  LB dressing required max encouragement to attempt threading pant legs and fasten shoes due to instability leaning forward and fearfulness. Max assist sit > stand at sink with UE support on sink, use of mirror to promote upright standing with 2nd person to pull pants over hips. Pt with increased forward weight shift with sit> stand when RUE on sink and pushing up from chair with LUE.  2) Pt seen for 1:1 OT with focus on sit <> stand, standing tolerance, and sidestepping to Rt.  Utilized Geologist, engineering for visual feedback to promote midline standing outside parallel bars.  Pt completed sit > stand with RUE on grab bar and pushing  up from chair with LUE x3 progressing from max to mod assist.  Pt with verbal and gesture cues to promote midline standing throughout session, however pt continues to have extreme Rt lean in standing.  Engaged in side stepping x3 with singular steps followed by advancing BUE on grab bar with max verbal cues with each step and max assist to maintain upright standing.  Pt extremely confused by verbal cues, attempted to decrease cues and focus on functional "stand up" and "step to me".  Therapy Documentation Precautions:  Precautions Precautions: Fall Restrictions Weight Bearing Restrictions: No Pain:  Pt with no c/o pain  See FIM for current functional status  Therapy/Group: Individual Therapy  Simonne Come 09/14/2013, 12:13 PM

## 2013-09-14 NOTE — Progress Notes (Signed)
Occupational Therapy Session Note  Patient Details  Name: Matthew Hodge MRN: [id number] Date of Birth: [birth year]  Today's Date: 09/14/2013 Time: 0830-0900 Time Calculation (min): 30 min  Short Term Goals: Week 1:  OT Short Term Goal 1 (Week 1): Pt will complete sit <> stand for LB bathing with max assist of one caregiver OT Short Term Goal 1 - Progress (Week 1): Progressing toward goal OT Short Term Goal 2 (Week 1): Pt will complete toilet transfer with max assist of one caregiver OT Short Term Goal 2 - Progress (Week 1): Progressing toward goal OT Short Term Goal 3 (Week 1): Pt will complete UB dressing with mod assist of one caregiver OT Short Term Goal 3 - Progress (Week 1): Met OT Short Term Goal 4 (Week 1): Pt will complete LB drssing with max assist of one caregiver OT Short Term Goal 4 - Progress (Week 1): Progressing toward goal  Skilled Therapeutic Interventions/Progress Updates:    1:1 Focus on bridging in bed to pull up pants with extra time, rolling with bed rails to come into sidelying, mod to max A sidelying to sitting with extra time, sitting balance at EOB with A to advance/ translate hips forward to have both feet positioned on the floor, mod A scoot pivot transfer bed to w/c towards the right, grooming at the sink with focus on locating items and bilateral UE coordination, sit to stands at the sink 2x with focus on standing balance, awareness of leaning to right with mirror visual feedback. Pt able to perform sit to stand with mod A and maintaining standing with min  A with tactile cues.   Therapy Documentation Precautions:  Precautions Precautions: Fall Restrictions Weight Bearing Restrictions: No Pain:  no c/o pain in session   See FIM for current functional status  Therapy/Group: Individual Therapy  Willeen Cass [facility] 09/14/2013, 4:27 PM

## 2013-09-14 NOTE — Progress Notes (Signed)
Speech Language Pathology Daily Session Note  Patient Details  Name: Matthew Hodge MRN: [id number] Date of Birth: 1924-12-26  Today's Date: 09/14/2013 Time: 1400-1435 Time Calculation (min): 35 min  Short Term Goals: Week 2: SLP Short Term Goal 1 (Week 2): Pt will sustain attention to functional task for 2 minutes with Max cues SLP Short Term Goal 2 (Week 2): Pt will use environmental clues to demonstrate orientation to time and place with Max cues SLP Short Term Goal 3 (Week 2): Pt will communicate basic wants/needs with Max cues SLP Short Term Goal 4 (Week 2): Pt will use basic problem solving to utilize call bell with Max cues  Skilled Therapeutic Interventions: Skilled treatment session focused on addressing cognitive goals. Patient was asked to problem solve and create a birdhouse from a kit.  Patient required Supervision verbal cues for bilateral upper extremity use; patient required Max verbal cues for functional problem solving during completion of task; patient perseverated on measuring and evening things up and required cues to move on to next step.  Continue with project tomorrow.     FIM:  Comprehension Comprehension Mode: Auditory Comprehension: 3-Understands basic 50 - 74% of the time/requires cueing 25 - 50%  of the time Expression Expression Mode: Verbal Expression: 3-Expresses basic 50 - 74% of the time/requires cueing 25 - 50% of the time. Needs to repeat parts of sentences. Social Interaction Social Interaction: 3-Interacts appropriately 50 - 74% of the time - May be physically or verbally inappropriate. Problem Solving Problem Solving: 2-Solves basic 25 - 49% of the time - needs direction more than half the time to initiate, plan or complete simple activities Memory Memory: 1-Recognizes or recalls less than 25% of the time/requires cueing greater than 75% of the time  Pain Pain Assessment Pain Assessment: No/denies pain  Therapy/Group: Individual  Therapy  Carmelia Roller., [place] [phone]  [place] 09/14/2013, 4:45 PM

## 2013-09-14 NOTE — Progress Notes (Signed)
Subjective/Complaints: 80 y.o. right-handed male was documented history of Alzheimer's disease. Patient lives with his wife and used a cane prior to admission. Patient on no scheduled medications prior to admission. Admitted 08/27/2013 with right side weakness and altered mental status. Cranial CT scan showed no acute abnormalities. Patient did receive TPA. MRI of the brain showed small patchy areas of acute infarct in the left parietal lobe. Possible 5 mm acute infarct right parietal white matter. MRA of the head with no large vessel occlusion. Echocardiogram with ejection fraction of 55% and normal systolic function.   Oriented to hospital but cannot recognize me as a doctor  Review of Systems - incomplete secondary to cognitive issues  Objective: Vital Signs: Blood pressure 129/79, pulse 78, temperature 98 F (36.7 C), temperature source Oral, resp. rate 19, height 6' (1.829 m), weight 81.194 kg (179 lb), SpO2 97.00%. No results found. No results found for this or any previous visit (from the past 72 hour(s)).   HEENT: normal Cardio: RRR and no murmurs Resp: CTA B/L and unlabored GI: BS positive and ND, suprapubic cath site intact/clean Extremity:  Pulses positive and No Edema Skin:   Intact Neuro: pleasantly confused, Cranial Nerve II-XII normal, Abnormal Sensory cannot fully assess but grossly intact LT B UE and BLE, Abnormal Motor 4/5 Bilateral delt bi tri grip HF KE ADF and Abnormal FMC Ataxic/ dec FMC Musc/Skel:  Other bilateral shoulder contracture, bilateral hamstrings contracture Gen NAD   Assessment/Plan: 1. Functional deficits secondary to R MCA infarct superimposed on chronic deconditioning which require 3+ hours per day of interdisciplinary therapy in a comprehensive inpatient rehab setting. Physiatrist is providing close team supervision and 24 hour management of active medical problems listed below. Physiatrist and rehab team continue to assess barriers to  discharge/monitor patient progress toward functional and medical goals.  FIM: FIM - Bathing Bathing Steps Patient Completed: Chest;Right Arm;Left Arm;Abdomen;Front perineal area;Right upper leg;Left upper leg Bathing: 3: Mod-Patient completes 5-7 40f 10 parts or 50-74%  FIM - Upper Body Dressing/Undressing Upper body dressing/undressing steps patient completed: Thread/unthread right sleeve of pullover shirt/dresss;Thread/unthread left sleeve of pullover shirt/dress;Put head through opening of pull over shirt/dress Upper body dressing/undressing: 4: Min-Patient completed 75 plus % of tasks FIM - Lower Body Dressing/Undressing Lower body dressing/undressing steps patient completed: Thread/unthread right pants leg;Thread/unthread left pants leg;Fasten/unfasten right shoe;Fasten/unfasten left shoe Lower body dressing/undressing: 1: Two helpers  FIM - Toileting Toileting: 1: Two helpers  FIM - Radio producer Devices: Recruitment consultant Transfers: 1-Two helpers  FIM - Control and instrumentation engineer Devices: Bed rails;Arm rests Bed/Chair Transfer: 1: Two helpers  FIM - Locomotion: Wheelchair Distance: 25 Locomotion: Wheelchair: 1: Travels less than 50 ft with moderate assistance (Pt: 50 - 74%) FIM - Locomotion: Ambulation Locomotion: Ambulation Assistive Devices: Sara Plus Ambulation/Gait Assistance: 1: +2 Total assist Locomotion: Ambulation: 1: Two helpers  Comprehension Comprehension Mode: Auditory Comprehension: 3-Understands basic 50 - 74% of the time/requires cueing 25 - 50%  of the time  Expression Expression Mode: Verbal Expression: 4-Expresses basic 75 - 89% of the time/requires cueing 10 - 24% of the time. Needs helper to occlude trach/needs to repeat words.  Social Interaction Social Interaction: 4-Interacts appropriately 75 - 89% of the time - Needs redirection for appropriate language or to initiate  interaction.  Problem Solving Problem Solving: 3-Solves basic 50 - 74% of the time/requires cueing 25 - 49% of the time  Memory Memory: 1-Recognizes or recalls less than 25% of the time/requires cueing  greater than 75% of the time  Medical Problem List and Plan:  1. Thrombotic Right MCA infarct  2. DVT Prophylaxis/Anticoagulation: SCDs.Monitor for any signs of DVT.  3. Pain Management: Tylenol as needed  4. 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 Worsening after bilateral parietal infarcts - Bed alarm for safety  - trial of aricept with minimal effect 5. Neuropsych: This patient is not capable of making decisions on his own behalf. 6.  Hx bladder outlet obstruction chronic supr pubic cath  LOS (Days) 13 A FACE TO FACE EVALUATION WAS PERFORMED  Matthew Hodge E 09/14/2013, 7:07 AM

## 2013-09-15 ENCOUNTER — Inpatient Hospital Stay (HOSPITAL_COMMUNITY): Payer: Medicare Other | Admitting: Speech Pathology

## 2013-09-15 ENCOUNTER — Inpatient Hospital Stay (HOSPITAL_COMMUNITY): Payer: Medicare Other | Admitting: Physical Therapy

## 2013-09-15 ENCOUNTER — Inpatient Hospital Stay (HOSPITAL_COMMUNITY): Payer: Medicare Other | Admitting: Occupational Therapy

## 2013-09-15 MED ORDER — ASPIRIN 325 MG PO TABS
325.0000 mg | ORAL_TABLET | Freq: Every day | ORAL | Status: DC
  Administered 2013-09-15 – 2013-09-26 (×12): 325 mg via ORAL
  Filled 2013-09-15 (×13): qty 1

## 2013-09-15 NOTE — Progress Notes (Signed)
Subjective/Complaints: 80 y.o. right-handed male was documented history of Alzheimer's disease. Patient lives with his wife and used a cane prior to admission. Patient on no scheduled medications prior to admission. Admitted 08/27/2013 with right side weakness and altered mental status. Cranial CT scan showed no acute abnormalities. Patient did receive TPA. MRI of the brain showed small patchy areas of acute infarct in the left parietal lobe. Possible 5 mm acute infarct right parietal white matter. MRA of the head with no large vessel occlusion. Echocardiogram with ejection fraction of 55% and normal systolic function.   Oriented to self only Appears comfortable and pleasant in bed  Review of Systems - incomplete secondary to cognitive issues  Objective: Vital Signs: Blood pressure 138/65, pulse 76, temperature 97.7 F (36.5 C), temperature source Oral, resp. rate 18, height 6' (1.829 m), weight 81.194 kg (179 lb), SpO2 97.00%. No results found. No results found for this or any previous visit (from the past 72 hour(s)).   HEENT: normal Cardio: RRR and no murmurs Resp: CTA B/L and unlabored GI: BS positive and ND, suprapubic cath site intact/clean Extremity:  Pulses positive and No Edema Skin:   Intact Neuro: pleasantly confused, Cranial Nerve II-XII normal, Abnormal Sensory cannot fully assess but grossly intact LT B UE and BLE, Abnormal Motor 4/5 Bilateral delt bi tri grip HF KE ADF and Abnormal FMC Ataxic/ dec FMC Musc/Skel:  Other bilateral shoulder contracture, bilateral hamstrings contracture Gen NAD   Assessment/Plan: 1. Functional deficits secondary to R MCA infarct superimposed on chronic deconditioning which require 3+ hours per day of interdisciplinary therapy in a comprehensive inpatient rehab setting. Physiatrist is providing close team supervision and 24 hour management of active medical problems listed below. Physiatrist and rehab team continue to assess barriers to  discharge/monitor patient progress toward functional and medical goals.  FIM: FIM - Bathing Bathing Steps Patient Completed: Chest;Right Arm;Left Arm;Abdomen;Front perineal area;Right upper leg;Left upper leg Bathing: 3: Mod-Patient completes 5-7 11f 10 parts or 50-74%  FIM - Upper Body Dressing/Undressing Upper body dressing/undressing steps patient completed: Thread/unthread right sleeve of pullover shirt/dresss;Thread/unthread left sleeve of pullover shirt/dress;Put head through opening of pull over shirt/dress Upper body dressing/undressing: 4: Min-Patient completed 75 plus % of tasks FIM - Lower Body Dressing/Undressing Lower body dressing/undressing steps patient completed: Thread/unthread right pants leg;Thread/unthread left pants leg;Fasten/unfasten right shoe;Fasten/unfasten left shoe Lower body dressing/undressing: 1: Two helpers  FIM - Toileting Toileting: 1: Two helpers  FIM - Radio producer Devices: Recruitment consultant Transfers: 1-Two helpers  FIM - Control and instrumentation engineer Devices: Bed rails;Arm rests Bed/Chair Transfer: 1: Two helpers  FIM - Locomotion: Wheelchair Distance: 25 Locomotion: Wheelchair: 1: Travels less than 50 ft with moderate assistance (Pt: 50 - 74%) FIM - Locomotion: Ambulation Locomotion: Ambulation Assistive Devices: Sara Plus Ambulation/Gait Assistance: 1: +2 Total assist Locomotion: Ambulation: 1: Two helpers  Comprehension Comprehension Mode: Auditory Comprehension: 3-Understands basic 50 - 74% of the time/requires cueing 25 - 50%  of the time  Expression Expression Mode: Verbal Expression: 3-Expresses basic 50 - 74% of the time/requires cueing 25 - 50% of the time. Needs to repeat parts of sentences.  Social Interaction Social Interaction: 3-Interacts appropriately 50 - 74% of the time - May be physically or verbally inappropriate.  Problem Solving Problem Solving: 2-Solves basic  25 - 49% of the time - needs direction more than half the time to initiate, plan or complete simple activities  Memory Memory: 1-Recognizes or recalls less than 25% of  the time/requires cueing greater than 75% of the time  Medical Problem List and Plan:  1. Thrombotic Right MCA infarct  2. DVT Prophylaxis/Anticoagulation: SCDs.Monitor for any signs of DVT.  3. Pain Management: Tylenol as needed  4. 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 Worsening after bilateral parietal infarcts - Bed alarm for safety  - trial of aricept with minimal effect 5. Neuropsych: This patient is not capable of making decisions on his own behalf. 6.  Hx bladder outlet obstruction chronic supr pubic cath  LOS (Days) 14 A FACE TO FACE EVALUATION WAS PERFORMED  Philopater Mucha E 09/15/2013, 7:39 AM

## 2013-09-15 NOTE — Progress Notes (Signed)
Physical Therapy Weekly Progress Note  Patient Details  Name: ERRIC MACHNIK MRN: [id number] Date of Birth: 05-13-1925  Today's Date: 09/15/2013 Time: [phone] and [phone] Time Calculation (min): 48 min and 51 min  Patient continues to have slow progress towards STG and LTG and has met  1 of 5 short term goals.  Pt is currently mod-max A for bed mobility with bed rails, bed <> w/c transfers, w/c mobility in controlled environment with bilat foot propulsion, max A stair negotiation with bilat rails but continues to require +2 or use of lift equipment for safe gait.  Patient continues to demonstrate the following deficits: R hemiparesis with R lateropulsion, impaired postural control, balance, gait, impaired sensation and perception of midline, R inattention, impaired cognition, impaired activity tolerance and therefore will continue to benefit from skilled PT intervention to enhance overall performance with activity tolerance, balance, postural control, ability to compensate for deficits, functional use of  right upper extremity and right lower extremity, attention, awareness and coordination.  Patient not progressing toward long term goals.  See goal revision..  Plan of care revisions: LTG downgraded secondary to slow progress.  PT Short Term Goals Week 2:  PT Short Term Goal 1 (Week 2): Pt will perform bed mobility with mod A and max verbal cues for sequencing PT Short Term Goal 2 (Week 2): Pt will performed bed <> w/c to L and R with mod A and max verbal cues for sequencing PT Short Term Goal 3 (Week 2): Pt will perform w/c mobility x 100' in controlled environment with bilat feet propulsion with mod A PT Short Term Goal 4 (Week 2): Pt will perform gait with RW x 25' with max A of one person and max verbal cues PT Short Term Goal 5 (Week 2): Pt will ascend/descend 3 stairs with 2 rails with max A of one person Week 3:  PT Short Term Goal 1 (Week 3): = LTG which have been downgraded  secondary to lack of progress  Skilled Therapeutic Interventions/Progress Updates:   Pt seated EOB with caregiver and RN present.  Caregiver reports pt has been performing sit <> stand and stand pivots more consistently now.  Pt performed stand pivot bed > w/c > toilet with caregiver max A.  Pt reporting need to have BM.  Pt supervised while on toilet.  Performed sit > stand and static standing with UE support on grab bar and w/c handle in front of pt with max A to maintain L lateral weight shift while caregiver performed hygiene and clothing management.  Performed stand pivot back to w/c with max A.  Continued gait training in controlled environment with SARA x 150' x 2 reps with decreased assistance needed for lateral weight shifting or upright trunk but continued to require total verbal cues for full step length bilaterally and full foot clearance RLE and assistance to prevent scissoring RLE; pt noting stretching in inner R thigh when manually facilitated into ABD during gait.  While pt resting in sitting discussed with caregiver plan for supervision at night.  Caregiver reports that pt does not usually have to have BM at night (usually after breakfast/movement) and if he does awaken his wife is to call [name] if any assistance or mobility is needed.  [name] is also setting up having a back up person when he is unable to be there to assist.  Returned to room and to w/c to rest before OT.    PM session:  Pt up in w/c.  Transported to gym in w/c total A.  With caregiver assistance pt performed stand pivot to [place]ust[place] with max A.  Performed bilat UE and LE ROM, strengthening, endurance and coordination on Nustep at level 5 x 10 minutes + 4 minutes with bilat LE only for alternating extensor strengthening.  Pt wife and daughter present for second half of session with pt demonstrating improved spirits with family present.  Performed stair negotiation training up/down 5 stairs x 2 reps with bilat UE support on 2 rails  with max A of one person on R side with assistance for L lateral and anterior weight shifting, assistance to maintain RLE extension during stance, assistance to fully advance RLE to next step and verbal cues for safe sequence.  Required one sitting rest break between sets.  Returned to w/c squat pivot max A to L.  Returned to room and quick release belt donned for pt to visit with wife and daughter while in w/c.    Therapy Documentation Precautions:  Precautions Precautions: Fall Restrictions Weight Bearing Restrictions: No Pain:  No c/o pain Locomotion : Ambulation Ambulation/Gait Assistance: 1: +2 Total assist   See FIM for current functional status  Therapy/Group: Individual Therapy  [name] [facility] 09/15/2013, 10:59 AM

## 2013-09-15 NOTE — Progress Notes (Signed)
Speech Language Pathology Daily Session Note  Patient Details  Name: Matthew Hodge MRN: [id number] Date of Birth: Jun 03, 1925  Today's Date: 09/15/2013 Time: 2353-6144 Time Calculation (min): 51 min  Short Term Goals: Week 2: SLP Short Term Goal 1 (Week 2): Pt will sustain attention to functional task for 2 minutes with Max cues SLP Short Term Goal 2 (Week 2): Pt will use environmental clues to demonstrate orientation to time and place with Max cues SLP Short Term Goal 3 (Week 2): Pt will communicate basic wants/needs with Max cues SLP Short Term Goal 4 (Week 2): Pt will use basic problem solving to utilize call bell with Max cues  Skilled Therapeutic Interventions: Skilled treatment session focused on addressing cognitive goals. Patient was asked to identify where we left off on building a bird house and problem solve where to start today with Max multi-modal cues.  Choice of two was utilized to limit field of choices and patient required increased wait time and Mod increased to Max verbal cues to make choices this way throughout session.  Patient demonstrated increased ability to attend, verbalize and initiate tasks throughout session despite needing Mod-Max cues to move on to next step.  Continue with project tomorrow.     FIM:  Comprehension Comprehension Mode: Auditory Comprehension: 4-Understands basic 75 - 89% of the time/requires cueing 10 - 24% of the time Expression Expression Mode: Verbal Expression: 4-Expresses basic 75 - 89% of the time/requires cueing 10 - 24% of the time. Needs helper to occlude trach/needs to repeat words. Social Interaction Social Interaction: 4-Interacts appropriately 75 - 89% of the time - Needs redirection for appropriate language or to initiate interaction. Problem Solving Problem Solving: 2-Solves basic 25 - 49% of the time - needs direction more than half the time to initiate, plan or complete simple activities Memory Memory: 1-Recognizes or  recalls less than 25% of the time/requires cueing greater than 75% of the time  Pain Pain Assessment Pain Assessment: No/denies pain Pain Score: 0-No pain  Therapy/Group: Individual Therapy  Carmelia Roller., [place] [phone]  [place] 09/15/2013, 4:31 PM

## 2013-09-15 NOTE — Progress Notes (Signed)
Occupational Therapy Session Note  Patient Details  Name: NUNO BRUBACHER MRN: [id number] Date of Birth: 1925/07/20  Today's Date: 09/15/2013 Time: 1007-1102 Time Calculation (min): 55 min  Short Term Goals: Week 2:  OT Short Term Goal 1 (Week 2): Pt will complete sit <> stand for LB bathing with max assist of one caregiver OT Short Term Goal 2 (Week 2): Pt will complete toilet transfer with max assist of one caregiver OT Short Term Goal 3 (Week 2): Pt will complete UB dressing with min assist OT Short Term Goal 4 (Week 2): Pt will complete LB dressing with max assist of one caregiver  Skilled Therapeutic Interventions/Progress Updates:    Engaged in ther ex and NM re-ed in unsupported sitting with focus on sitting balance and overall strengthening.  Pt unable to cognitively understand various activities, therefore engaged in ther ex to improve participation.  Engaged in [place] with theraband in unsupported sitting.  Encouraged pt to count aloud.  Pt with Rt lateral lean in unsupported sitting with pt with increased awareness and ability to correct during exercise.  Pt with decreased RUE strength with theraband exercise.  Squat pivot max assist w/c to therapy mat, stand pivot with caregiver to Lt to w/c at end of session.  Pt with increased pushing to Rt with sit > stand and transfer.  Therapy Documentation Precautions:  Precautions Precautions: Fall Restrictions Weight Bearing Restrictions: No Pain:  Pt with no c/o pain this session.  See FIM for current functional status  Therapy/Group: Individual Therapy  Simonne Come 09/15/2013, 11:58 AM

## 2013-09-16 ENCOUNTER — Inpatient Hospital Stay (HOSPITAL_COMMUNITY): Payer: Medicare Other | Admitting: Speech Pathology

## 2013-09-16 ENCOUNTER — Inpatient Hospital Stay (HOSPITAL_COMMUNITY): Payer: Medicare Other | Admitting: Physical Therapy

## 2013-09-16 ENCOUNTER — Inpatient Hospital Stay (HOSPITAL_COMMUNITY): Payer: Medicare Other | Admitting: Occupational Therapy

## 2013-09-16 NOTE — Patient Care Conference (Signed)
Inpatient RehabilitationTeam Conference and Plan of Care Update Date: 09/16/2013   Time: 11:00 AM    Patient Name: Matthew Hodge      Medical Record Number: [id number]  Date of Birth: April 07, 1925 Sex: Male         Room/Bed: 4W12C/4W12C-01 Payor Info: Payor: MEDICARE / Plan: MEDICARE PART A AND B / Product Type: *No Product type* /    Admitting Diagnosis: CVA  Admit Date/Time:  09/01/2013  5:10 PM Admission Comments: No comment available   Primary Diagnosis:  <principal problem not specified> Principal Problem: <principal problem not specified>  Patient Active Problem List   Diagnosis Date Noted  . CVA (cerebral infarction) 09/01/2013  . Stroke 08/27/2013  . Alzheimer's disease   . Enlarged prostate with urinary retention 07/15/2012  . Chronic constipation 07/15/2012  . Hyperglycemia 07/15/2012  . Acute renal failure 07/15/2012  . Leukocytosis 07/15/2012  . Normocytic anemia 07/15/2012  . UTI (lower urinary tract infection) 07/15/2012    Expected Discharge Date: Expected Discharge Date: 09/26/13  Team Members Present: Physician leading conference: Dr. Alysia Penna Social Worker Present: Ovidio Kin, LCSW;Jenny Prevatt, LCSW Nurse Present: Dorien Chihuahua, RN PT Present: [name], PT;Emily Rinaldo Cloud, PT OT Present: Simonne Come, OT;Kris Gellert, Maryella Shivers, OT SLP Present: Gunnar Fusi, SLP PPS Coordinator present : Daiva Nakayama, RN, CRRN     Current Status/Progress Goal Weekly Team Focus  Medical   poor cognition minimal improvement on aricept  Maintain stability  D/C planning   Bowel/Bladder   suprapubic cath and continent of bowel  remain continent of bowel and manage suprapubic cath  educate care giver on catheter maintainance and medication adminstration    Swallow/Nutrition/ Hydration     Regular diet        ADL's   max assist transfers, mod assist bathing at sit > stand in shower, min-supervision UB dressing, +2 for LB dressing at sit > stand level   min-mod assist overall  focus on functional tasks, self-care retraining, sitting balance, transfers   Mobility   Mod-max A overall for transfers, stairs but requires +2 and lift equipment for gait  MIn A w/c mobility, Mod A transfers, max A stairs with 2 rails,  max A gait with therapy only  transfer training with caregiver, car transfers, stairs, gait   Communication   Min-Mod assist for initiation   Min assist   increase initiation of wants and needs with choice of two   Safety/Cognition/ Behavioral Observations  Max for orientation and basic tasks  downgraded to mod assist   increase initiation, cessation and ability to move to next step of task   Pain   n/a  n./a  n/a   Skin   bruising bil UE  maintain skin integrity  assess skin and educate on safe transfers and skin care      *See Care Plan and progress notes for long and short-term goals.  Barriers to Discharge: requires 24/7 care, severe dementia    Possible Resolutions to Barriers:  Increased caregiver hours vs SNF    Discharge Planning/Teaching Needs:  Hom with wife and [name] assisting, [name] is learning pt's care and particiapting.  Ques if can move up discharge date      Team Discussion:  Have begun training with Gary-caregiver-repetitions-small gains.  See how family training goes and may be able to move up discharge date.  Revisions to Treatment Plan: D/C speech since no progress due to lack of progress due to dementia   Continued Need for  Acute Rehabilitation Level of Care: The patient requires daily medical management by a physician with specialized training in physical medicine and rehabilitation for the following conditions: Daily direction of a multidisciplinary physical rehabilitation program to ensure safe treatment while eliciting the highest outcome that is of practical value to the patient.: Yes Daily medical management of patient stability for increased activity during participation in an intensive  rehabilitation regime.: Yes  Nanako Stopher, [name] 09/17/2013, 9:26 AM

## 2013-09-16 NOTE — Progress Notes (Signed)
Speech Language Pathology Daily Session Note  Patient Details  Name: Matthew Hodge MRN: [id number] Date of Birth: 1925/01/12  Today's Date: 09/16/2013 Time: 8657-8469 Time Calculation (min): 46 min  Short Term Goals: Week 2: SLP Short Term Goal 1 (Week 2): Pt will sustain attention to functional task for 2 minutes with Max cues SLP Short Term Goal 2 (Week 2): Pt will use environmental clues to demonstrate orientation to time and place with Max cues SLP Short Term Goal 3 (Week 2): Pt will communicate basic wants/needs with Max cues SLP Short Term Goal 4 (Week 2): Pt will use basic problem solving to utilize call bell with Max cues  Skilled Therapeutic Interventions: Skilled treatment session focused on addressing cognitive goals by continuing a bird house project.  SLP facilitated session with choice of two cues and Mod assist repeats to assist with initiation of verbal expression through task.  SLP was able to fade cues from Max to Mod multi-modal cues to problem solve steps of task throughout session.  Patient demonstrated increased ability to selectively attend, verbalize and initiate requests for help/additional equipment tasks throughout.  Continue with project tomorrow.     FIM:  Comprehension Comprehension Mode: Auditory Comprehension: 4-Understands basic 75 - 89% of the time/requires cueing 10 - 24% of the time Expression Expression Mode: Verbal Expression: 3-Expresses basic 50 - 74% of the time/requires cueing 25 - 50% of the time. Needs to repeat parts of sentences. Social Interaction Social Interaction: 4-Interacts appropriately 75 - 89% of the time - Needs redirection for appropriate language or to initiate interaction. Problem Solving Problem Solving: 3-Solves basic 50 - 74% of the time/requires cueing 25 - 49% of the time Memory Memory: 1-Recognizes or recalls less than 25% of the time/requires cueing greater than 75% of the time  Pain Pain Assessment Pain  Assessment: No/denies pain  Therapy/Group: Individual Therapy  Carmelia Roller., [place] [phone]  [place] 09/16/2013, 4:40 PM

## 2013-09-16 NOTE — Progress Notes (Signed)
Physical Therapy Session Note  Patient Details  Name: MARQ REBELLO MRN: [id number] Date of Birth: 10/29/1924  Today's Date: 09/16/2013 Time: 5993-5701 and 7793-9030 Time Calculation (min): 40 min and 39 min  Short Term Goals: Week 1:  PT Short Term Goal 1 (Week 1): Pt will perform supine<>sit with max A PT Short Term Goal 1 - Progress (Week 1): Met PT Short Term Goal 2 (Week 1): Pt will transfer bed<>w/c max A +1 PT Short Term Goal 2 - Progress (Week 1): Met PT Short Term Goal 3 (Week 1): Pt will maintain static sitting balance with min A PT Short Term Goal 3 - Progress (Week 1): Met Week 2:  PT Short Term Goal 1 (Week 2): Pt will perform bed mobility with mod A and max verbal cues for sequencing PT Short Term Goal 1 - Progress (Week 2): Progressing toward goal PT Short Term Goal 2 (Week 2): Pt will performed bed <> w/c to L and R with mod A and max verbal cues for sequencing PT Short Term Goal 2 - Progress (Week 2): Progressing toward goal PT Short Term Goal 3 (Week 2): Pt will perform w/c mobility x 100' in controlled environment with bilat feet propulsion with mod A PT Short Term Goal 3 - Progress (Week 2): Progressing toward goal PT Short Term Goal 4 (Week 2): Pt will perform gait with RW x 25' with max A of one person and max verbal cues PT Short Term Goal 4 - Progress (Week 2): Progressing toward goal PT Short Term Goal 5 (Week 2): Pt will ascend/descend 3 stairs with 2 rails with max A of one person PT Short Term Goal 5 - Progress (Week 2): Met Week 3:  PT Short Term Goal 1 (Week 3): = LTG which have been downgraded secondary to lack of progress  Skilled Therapeutic Interventions/Progress Updates:   Pt in w/c with caregiver present.  Transported to gym in w/c total A.  Discussed with tech changing out w/c to 18 x 18 w/c secondary to his w/c is in dumped position and requires increased energy to scoot forward to prepare for transfer.  Performed multiple sit <> stand in // bars  with bilat UE support on bars with cues to slide UE forwards to facilitate anterior lean and therapist maintaining R foot position in DF and ABD position, minimal verbal cues and extra time given for pt to initiate and complete full sit > stand and activate RLE extension to facilitate L lateral weight shifting in standing.  Performed NMR; see below for details.  Changed pt to 18 x 18 manual w/c without dump.  Caregiver wishing to work on sit<>stand with pt in room with UE support at sink to maintain repetition; verbalized safe sit <> stand sequence to caregiver in room and had caregiver return demonstrate safe sit > stand from w/c with assistance on R to maintain R foot position and mild blocking at R knee for full extension and use of mirror as visual vertical for self correction of weight shift and trunk position to midline.    PM session:  Pt in w/c and motivated to participate in therapy.  Transported to gym in w/c.  Performed stand pivot transfer with caregiver to Nustep with max A but improved initiation with sit > stand and pivoting.  Performed Nustep for bilat UE and LE ROM, strengthening, endurance and coordination at level 5 resistance x 8 minutes + 2 minutes with LE only for extensor strengthening and training.  Following exercise performed gait training with RW with min >> mod/max A secondary to pushing and lateropulsion to R as pt fatigued x 50' x 2 reps with constant verbal cues for L lateral weight shift, full upright posture and full step length and foot clearance bilaterally.  As pt fatigued he required sitting rest break and then able to continue with gait training after short rest break.  Following gait returned to room and performed sit > stand from w/c and stand pivot w/c > bed with RW and mod-max A but with improved initiation with sit > stand and pivoting.  Pt to remain seated EOB with caregiver present to drink water and then transition to supine with caregiver.    Therapy  Documentation Precautions:  Precautions Precautions: Fall Restrictions Weight Bearing Restrictions: No Pain:  No c/o pain Locomotion : Ambulation Ambulation/Gait Assistance: 3: Mod assist  Other Treatments: Treatments Neuromuscular Facilitation: Right;Left;Lower Extremity;Activity to increase motor control;Activity to increase timing and sequencing;Activity to increase grading;Activity to increase sustained activation;Activity to increase lateral weight shifting;Activity to increase anterior-posterior weight shifting during gait in // bars x 9' x 2 reps with bilat UE support on bars with mod A and max verbal cues for stepping sequence and full lateral and anterior weight shifting of COG, mild assistance needed at R knee for stabilization in stance.  Pt tolerated well with decreased pushing and anxiety noted.    See FIM for current functional status  Therapy/Group: Individual Therapy  [name] [facility] 09/16/2013, 11:55 AM

## 2013-09-16 NOTE — Plan of Care (Signed)
Problem: RH BLADDER ELIMINATION Goal: RH STG MANAGE BLADDER WITH ASSISTANCE STG Manage Bladder With maxx Assistance  Outcome: Not Progressing Total assist

## 2013-09-16 NOTE — Progress Notes (Signed)
Occupational Therapy Weekly Progress Note  Patient Details  Name: Matthew Hodge MRN: [id number] Date of Birth: 03-Apr-1925  Today's Date: 09/16/2013 Time: 0920-1015  Time Calculation (min): 55 min  Patient has met 3 of 4 short term goals. Pt is continuing to make slow but steady progress towards goals. Pt's progress is limited due to his tendency for lateropulsion back and to Rt in static sitting, standing, and increases with transfers. Pt continues to require assistance of 2 caregivers when completing LB dressing at sit> stand level as pt requires assist to maintain standing while second person pulls up pants. Pt continues to require max assist squat pivot transfers due to pushing, has demonstrated improvements with stand pivot when going to Lt with UE support on grab bar. Pt progress is limited by premorbid cognitive impairments, retro and Rt lateral pushing and anxiety with movement.  Patient continues to demonstrate the following deficits: Rt hemiparesis, impaired perception of midline with pushing to Rt and posterior, impaired postural control, impaired balance, cognitive deficits and anxiety with mobility and therefore will continue to benefit from skilled OT intervention to enhance overall performance with BADL and Reduce care partner burden.  Patient progressing toward long term goals..  Continue plan of care.  OT Short Term Goals OT Short Term Goal 1 (Week 2): Pt will complete sit <> stand for LB bathing with max assist of one caregiver OT Short Term Goal 1 - Progress (Week 2): Met OT Short Term Goal 2 (Week 2): Pt will complete toilet transfer with max assist of one caregiver OT Short Term Goal 2 - Progress (Week 2): Met OT Short Term Goal 3 (Week 2): Pt will complete UB dressing with min assist OT Short Term Goal 3 - Progress (Week 2): Met OT Short Term Goal 4 (Week 2): Pt will complete LB dressing with max assist of one caregiver OT Short Term Goal 4 - Progress (Week 2):  Progressing toward goal  OT Short Term Goal 1 (Week 3): Pt will complete sit <> stand for LB bathing with mod assist OT Short Term Goal 2 (Week 3): Pt will complete LB dressing with max assist of one caregiver OT Short Term Goal 3 (Week 3): Pt will complete sidestepping to Rt with mod assist to simulate stepping out of walk-in shower  Skilled Therapeutic Interventions/Progress Updates:    Pt seen for ADL retraining with focus on increasing participation in functional tasks of bathing and dressing. Pt received up in w/c. Completed sit > stand from w/c with use of grab bar and performed stand step transfer to shower chair with max assist and cues for sidestepping with RUE, pt with increased ability to advance RLE with sidestepping to Lt this session. Pt with increased sequencing with bathing in more familiar environment, requiring max assist to maintain standing to wash buttocks due to pt pushing to Rt in standing. Pt required total assist transfer out of shower (to Rt) secondary to pushing Rt and unable to lift RLE. LB dressing required max encouragement to attempt threading pant legs and fasten shoes due to instability leaning forward and fearfulness. Max assist sit > stand at sink with UE support on sink, use of mirror to promote upright standing with 2nd person to pull pants over hips, pt able to assist with pulling up brief this session. Pt with increased forward weight shift with sit> stand when RUE on sink and pushing up from chair with LUE.  Therapy Documentation Precautions:  Precautions Precautions: Fall Restrictions Weight Bearing Restrictions:  No Pain:  Pt with no c/o pain this session.  See FIM for current functional status  Therapy/Group: Individual Therapy  Simonne Come 09/16/2013, 11:53 AM

## 2013-09-16 NOTE — Progress Notes (Signed)
Social Work Patient ID: Matthew Hodge, male   DOB: Aug 15, 1925, 80 y.o.   MRN: [id number] Met with pt and Matthew Hodge to inform of team conference progression toward goals and now to have Matthew Severin do hands on care and see if goes well then possibly moving up discharge date. Both are eager to try and pt wants to do what he can to go home, he misses his wife.  Matthew Hodge aware of the amount of care pt requires.  Very pleased with progress using a rolling walker today. Will also contact wife to update her and see if any questions.

## 2013-09-17 ENCOUNTER — Inpatient Hospital Stay (HOSPITAL_COMMUNITY): Payer: Medicare Other | Admitting: Physical Therapy

## 2013-09-17 ENCOUNTER — Inpatient Hospital Stay (HOSPITAL_COMMUNITY): Payer: Medicare Other | Admitting: Occupational Therapy

## 2013-09-17 ENCOUNTER — Inpatient Hospital Stay (HOSPITAL_COMMUNITY): Payer: Medicare Other | Admitting: Speech Pathology

## 2013-09-17 NOTE — Progress Notes (Signed)
Speech Language Pathology Discharge Summary  Patient Details  Name: Matthew Hodge MRN: [id number] Date of Birth: 1924/09/11  Today's Date: 09/17/2013 Time: 1400-1445 Time Calculation (min): 45 min  Skilled Therapeutic Interventions:  Skilled treatment session focused on addressing cognitive goals and wrap up of education with caregiver.  SLP facilitated session with task from previous two days, despite not verbally recalling task patient appeared to more familiar with it.  SLP provided choice of two cues and Min assist repeats to assist with initiation of verbal expression through task.  SLP also provided Mod multi-modal cues to problem solve steps of task throughout session.  Patient demonstrated increased awareness of fatigue stating the he needed a break and task was ended.  SLP also facilitated session with finalizing effective strategies for cognition compensation with caregiver.  Patient and caregiver ready for discharge from SLP services.    Patient has met 5 of 6 long term goals.  Patient to discharge at overall Max;Min;Mod level.  Reasons goals not met: severity of memory impairment impacted ability to store and recall that he even had a CVA let alone what his impairments are.    Clinical Impression/Discharge Summary: Patient met 5 out of 6 long term goals during his CIR stay due to gains in initiation of verbal and functional skills.  Patient progressed from overall Max-Total assist for all basic self-care tasks to [facility] for initiation of verbal expression of basic wants and needs, Mod for basic familiar problem solving and Max assist for recall of routine.  Caregiver has been present for therapy sessions and verbalized/demonstrated ability to utilize appropriate strategies for making choices, redirecting and reminding patient of things.    Care Partner:  Caregiver Able to Provide Assistance: Yes  Type of Caregiver Assistance: Physical;Cognitive  Recommendation:  24 hour  supervision/assistance  Rationale for SLP Follow Up:  (n/a)   Equipment: none   Reasons for discharge: Treatment goals met;Lack of progress towards goals   Patient/Family Agrees with Progress Made and Goals Achieved: Yes   See FIM for current functional status  Carmelia Roller., CCC-SLP [phone]  Gabryel Files 09/17/2013, 4:38 PM

## 2013-09-17 NOTE — Progress Notes (Signed)
Physical Therapy Session Note  Patient Details  Name: Matthew Hodge MRN: [id number] Date of Birth: 08-11-1925  Today's Date: 09/17/2013 Time: 1044-1130 and 1330-1400 (1300-1400 full time; [facility] Time Calculation (min): 46 min and 30 min  Short Term Goals: Week 3:  PT Short Term Goal 1 (Week 3): = LTG which have been downgraded secondary to lack of progress  Skilled Therapeutic Interventions/Progress Updates:   Pt in w/c with caregiver present.  Pt and caregiver agreeable to practicing car transfers with RW today.  Transported to car simulator in w/c total A.  Therapist provided mod-max assistance for first transfer w/c <> car with RW to L and then R with mod A to transfer stand pivot to car with assistance for lateral weight shifting, guidance of RW and verbal cues for sequence; when returning to w/c pt required max A secondary to increased pushing to R and increased assistance, time and verbal cues for lateral weight shifting to allow RLE to advance to R.  Pt only required min A to bring feet into car but mod A to pivot hips, feet and bring trunk to upright position when exiting car.  Caregiver gave repeat demonstration of transfer but required intermittent assistance of therapist for safety and sequencing.  Will continue to practice.  Pt w/c STF noted to be slightly too tall for feet to reach floor for propulsion with caregiver in between therapies.  Returned to room and performed stand pivot transfer to straight back chair with mod-max A with RW.  Caregiver adjusted each caster down one axle and then pt transferred back to w/c with max-total A stand pivot to R.  Pt now better able to reach floor with feet.    PM session: skilled therapy co-treat with OT beginning with gait training in // bars with focus on forwards, retro and R/L lateral stepping with mod A overall for lateral and anterior weight shifting and intermittent assistance to prevent RLE scissoring to prepare for gait  training w/c <> toilet with RW to begin to simulate home mobility (bathroom at home not w/c accessible).  Returned to room and performed 1 rep transfer w/c <> toilet ambulating 10' x 2 with RW and +2 for safety with total verbal and tactile cues for lateral and anterior weight shifting, safe management of RW and assistance for safe pivoting to prepare for sitting.  Pt currently unsafe to perform with one person or with caregiver but will continue to address.  Performed stair negotiation training with bilat UE support on rails with step to sequence with pt choosing to ascend and descend with RLE with mod A for lateral and anterior weight shifting; decreased assistance needed today for RLE clearance and stabilization in stance.  Returned to room to rest before SLP session.    Therapy Documentation Precautions:  Precautions Precautions: Fall Restrictions Weight Bearing Restrictions: No Pain: Pain Assessment Pain Assessment: No/denies pain Pain Score: 0-No pain  See FIM for current functional status  Therapy/Group: Individual Therapy and Co-Treatment  [name] [facility] 09/17/2013, 12:25 PM

## 2013-09-17 NOTE — Plan of Care (Signed)
Problem: RH Awareness Goal: LTG: Patient will demonstrate intellectual/emergent (SLP) LTG: Patient will demonstrate intellectual/emergent/anticipatory awareness with assist during a cognitive/linguistic activity (SLP)  Outcome: Not Met (add Reason) Due to severity of memory: storage and retrieval deficits

## 2013-09-17 NOTE — Progress Notes (Signed)
Occupational Therapy Session Note  Patient Details  Name: MAREO PORTILLA MRN: [id number] Date of Birth: [birth year]  Today's Date: 09/17/2013 Time: 3888-2800 and 1300-1330 (co-tx with PT 1300-1400) Time Calculation (min): 60 min and 30 min  Short Term Goals: Week 3:  OT Short Term Goal 1 (Week 3): Pt will complete sit <> stand for LB bathing with mod assist OT Short Term Goal 2 (Week 3): Pt will complete LB dressing with max assist of one caregiver OT Short Term Goal 3 (Week 3): Pt will complete sidestepping to Rt with mod assist to simulate stepping out of walk-in shower  Skilled Therapeutic Interventions/Progress Updates:    1) Engaged in sit <> stand and transfer training with pt and caregiver.  Engaged in stand pivot transfer to tub bench in ADL tubroom with focus on transferring to surface while eliminating the need to step in to the walk in shower or step down into shower at home.  Discussed increased safety with modifying shower or setting up tubbench to allow for squat pivot or stand pivot transfer.  Caregiver performed stand pivot transfer to tub bench to Lt with max assist, requiring total assist when returning to Rt due to increase in pushing.  Discussed use of squat pivot to increase safety.  Pt's bathroom is not w/c accessible, plan to address transfers with RW.  Engaged in sit > stand outside parallel bars with mirror for visual feedback to promote midline standing, pt continues with Rt lateral lean which increases with fatigue and confusion.  While standing, pt reports need to have BM.  Performed stand pivot transfer to toilet with use of grab bars to improve forward weight shift and decrease pushing.  2) Pt seen for skilled co-tx with PT with focus on functional mobility and problem solving bathroom accessibility and transfers.  Engaged in sit <> stand with forward and backward walking to focus on weight shifting and use of repetition for carryover.  Followed with ambulating into  bathroom with RW with +2 for safety to perform toilet transfer as pt's bathroom is not accessible via w/c.  Pt continues to require mod-max physical assist with mobility due to pushing which increases in standing and max verbal cues for sequencing with ambulation and step pattern. Caregiver reports RW will fit through doorway but the bathroom is too small to maneuver RW in bathroom.  Feel that pt would benefit from home eval to allow for further focus on bathroom accessibility, problem solving, and safety in home environment.    Therapy Documentation Precautions:  Precautions Precautions: Fall Restrictions Weight Bearing Restrictions: No Pain: Pain Assessment Pain Assessment: No/denies pain Pain Score: 0-No pain  See FIM for current functional status  Therapy/Group: Individual Therapy and Co-Treatment  Minnette Merida, [place] 09/17/2013, 12:30 PM

## 2013-09-17 NOTE — Progress Notes (Signed)
Subjective/Complaints: 80 y.o. right-handed male was documented history of Alzheimer's disease. Patient lives with his wife and used a cane prior to admission. Patient on no scheduled medications prior to admission. Admitted 08/27/2013 with right side weakness and altered mental status. Cranial CT scan showed no acute abnormalities. Patient did receive TPA. MRI of the brain showed small patchy areas of acute infarct in the left parietal lobe. Possible 5 mm acute infarct right parietal white matter. MRA of the head with no large vessel occlusion. Echocardiogram with ejection fraction of 55% and normal systolic function.   Oriented to self, not place or time, recognizes me as "Dr." Matthew Hodge comfortable and pleasant in bed  Review of Systems - incomplete secondary to cognitive issues  Objective: Vital Signs: Blood pressure 132/63, pulse 80, temperature 97.9 F (36.6 C), temperature source Oral, resp. rate 18, height 6' (1.829 m), weight 81.194 kg (179 lb), SpO2 95.00%. No results found. No results found for this or any previous visit (from the past 72 hour(s)).   HEENT: normal Cardio: RRR and no murmurs Resp: CTA B/L and unlabored GI: BS positive and ND, suprapubic cath site intact/clean Extremity:  Pulses positive and No Edema Skin:   Intact Neuro: pleasantly confused, Cranial Nerve II-XII normal, Abnormal Sensory cannot fully assess but grossly intact LT B UE and BLE, Abnormal Motor 4/5 Bilateral delt bi tri grip HF KE ADF and Abnormal FMC Ataxic/ dec FMC Musc/Skel:  Other bilateral shoulder contracture, bilateral hamstrings contracture Gen NAD   Assessment/Plan: 1. Functional deficits secondary to R MCA infarct superimposed on chronic deconditioning which require 3+ hours per day of interdisciplinary therapy in a comprehensive inpatient rehab setting. Physiatrist is providing close team supervision and 24 hour management of active medical problems listed below. Physiatrist and rehab team  continue to assess barriers to discharge/monitor patient progress toward functional and medical goals.  FIM: FIM - Bathing Bathing Steps Patient Completed: Chest;Right Arm;Left Arm;Abdomen;Front perineal area;Right upper leg;Left upper leg Bathing: 3: Mod-Patient completes 5-7 29f 10 parts or 50-74%  FIM - Upper Body Dressing/Undressing Upper body dressing/undressing steps patient completed: Thread/unthread right sleeve of pullover shirt/dresss;Thread/unthread left sleeve of pullover shirt/dress;Put head through opening of pull over shirt/dress;Pull shirt over trunk Upper body dressing/undressing: 5: Set-up assist to: Obtain clothing/put away FIM - Lower Body Dressing/Undressing Lower body dressing/undressing steps patient completed: Pull underwear up/down;Thread/unthread left pants leg;Fasten/unfasten right shoe;Fasten/unfasten left shoe Lower body dressing/undressing: 1: Two helpers  FIM - Musician Devices: Grab bar or rail for support Toileting: 1: Two helpers  FIM - Radio producer Devices: Product manager Transfers: 1-Two helpers  FIM - Control and instrumentation engineer Devices: Bed rails;Arm rests Bed/Chair Transfer: 1: Two helpers  FIM - Locomotion: Wheelchair Distance: 25 Locomotion: Wheelchair: 1: Total Assistance/staff pushes wheelchair (Pt<25%) FIM - Locomotion: Ambulation Locomotion: Ambulation Assistive Devices: Parallel bars Ambulation/Gait Assistance: 3: Mod assist Locomotion: Ambulation: 1: Travels less than 50 ft with moderate assistance (Pt: 50 - 74%)  Comprehension Comprehension Mode: Auditory Comprehension: 4-Understands basic 75 - 89% of the time/requires cueing 10 - 24% of the time  Expression Expression Mode: Verbal Expression: 3-Expresses basic 50 - 74% of the time/requires cueing 25 - 50% of the time. Needs to repeat parts of sentences.  Social Interaction Social Interaction: 4-Interacts  appropriately 75 - 89% of the time - Needs redirection for appropriate language or to initiate interaction.  Problem Solving Problem Solving: 2-Solves basic 25 - 49% of the time - needs direction more  than half the time to initiate, plan or complete simple activities  Memory Memory: 1-Recognizes or recalls less than 25% of the time/requires cueing greater than 75% of the time  Medical Problem List and Plan:  1. Thrombotic Right MCA infarct  2. DVT Prophylaxis/Anticoagulation: SCDs.Monitor for any signs of DVT.  3. Pain Management: Tylenol as needed  4. 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 Worsening after bilateral parietal infarcts - Bed alarm for safety  - trial of aricept with minimal effect, have increased to 10mg  with no SE thus far 5. Neuropsych: This patient is not capable of making decisions on his own behalf. 6.  Hx bladder outlet obstruction chronic supr pubic cath  LOS (Days) 16 A FACE TO FACE EVALUATION WAS PERFORMED  KIRSTEINS,ANDREW E 09/17/2013, 7:22 AM

## 2013-09-17 NOTE — Progress Notes (Signed)
Social Work Elease Hashimoto, LCSW Social Worker Signed  Patient Care Conference Service date: 09/16/2013 2:38 PM  Inpatient RehabilitationTeam Conference and Plan of Care Update Date: 09/16/2013   Time: 11:00 AM     Patient Name: Matthew Hodge       Medical Record Number: [id number]   Date of Birth: [birth year] Sex: Male         Room/Bed: 4W12C/4W12C-01 Payor Info: Payor: MEDICARE / Plan: MEDICARE PART A AND B / Product Type: *No Product type* /   Admitting Diagnosis: CVA   Admit Date/Time:  09/01/2013  5:10 PM Admission Comments: No comment available   Primary Diagnosis:  <principal problem not specified> Principal Problem: <principal problem not specified>    Patient Active Problem List     Diagnosis  Date Noted   .  CVA (cerebral infarction)  09/01/2013   .  Stroke  08/27/2013   .  Alzheimer's disease     .  Enlarged prostate with urinary retention  07/15/2012   .  Chronic constipation  07/15/2012   .  Hyperglycemia  07/15/2012   .  Acute renal failure  07/15/2012   .  Leukocytosis  07/15/2012   .  Normocytic anemia  07/15/2012   .  UTI (lower urinary tract infection)  07/15/2012     Expected Discharge Date: Expected Discharge Date: 09/26/13  Team Members Present: Physician leading conference: Dr. Alysia Penna Social Worker Present: Ovidio Kin, LCSW;Jenny Prevatt, LCSW Nurse Present: Dorien Chihuahua, RN PT Present: [name], PT;Emily Rinaldo Cloud, PT OT Present: Simonne Come, OT;Kris Gellert, Maryella Shivers, OT SLP Present: Gunnar Fusi, SLP PPS Coordinator present : Daiva Nakayama, RN, CRRN        Current Status/Progress  Goal  Weekly Team Focus   Medical     poor cognition minimal improvement on aricept  Maintain stability  D/C planning   Bowel/Bladder     suprapubic cath and continent of bowel  remain continent of bowel and manage suprapubic cath  educate care giver on catheter maintainance and medication adminstration    Swallow/Nutrition/ Hydration    Regular diet       ADL's     max assist transfers, mod assist bathing at sit > stand in shower, min-supervision UB dressing, +2 for LB dressing at sit > stand level  min-mod assist overall  focus on functional tasks, self-care retraining, sitting balance, transfers   Mobility     Mod-max A overall for transfers, stairs but requires +2 and lift equipment for gait  MIn A w/c mobility, Mod A transfers, max A stairs with 2 rails,  max A gait with therapy only  transfer training with caregiver, car transfers, stairs, gait   Communication     Min-Mod assist for initiation   Min assist   increase initiation of wants and needs with choice of two   Safety/Cognition/ Behavioral Observations    Max for orientation and basic tasks  downgraded to mod assist   increase initiation, cessation and ability to move to next step of task   Pain     n/a  n./a  n/a   Skin     bruising bil UE  maintain skin integrity  assess skin and educate on safe transfers and skin care     *See Care Plan and progress notes for long and short-term goals.    Barriers to Discharge:  requires 24/7 care, severe dementia      Possible Resolutions to Barriers:    Increased caregiver  hours vs SNF      Discharge Planning/Teaching Needs:    Hom with wife and [name] assisting, [name] is learning pt's care and particiapting.  Ques if can move up discharge date      Team Discussion:    Have begun training with Gary-caregiver-repetitions-small gains.  See how family training goes and may be able to move up discharge date.   Revisions to Treatment Plan: D/C speech since no progress due to lack of progress due to dementia    Continued Need for Acute Rehabilitation Level of Care: The patient requires daily medical management by a physician with specialized training in physical medicine and rehabilitation for the following conditions: Daily direction of a multidisciplinary physical rehabilitation program to ensure safe treatment while  eliciting the highest outcome that is of practical value to the patient.: Yes Daily medical management of patient stability for increased activity during participation in an intensive rehabilitation regime.: Yes  Elease Hashimoto 09/17/2013, 9:26 AM          Patient ID: Matthew Hodge, male   DOB: 1925-02-13, 80 y.o.   MRN: [id number]

## 2013-09-18 ENCOUNTER — Inpatient Hospital Stay (HOSPITAL_COMMUNITY): Payer: Medicare Other | Admitting: Physical Therapy

## 2013-09-18 ENCOUNTER — Inpatient Hospital Stay (HOSPITAL_COMMUNITY): Payer: Medicare Other | Admitting: Occupational Therapy

## 2013-09-18 DIAGNOSIS — R339 Retention of urine, unspecified: Secondary | ICD-10-CM

## 2013-09-18 DIAGNOSIS — I6350 Cerebral infarction due to unspecified occlusion or stenosis of unspecified cerebral artery: Secondary | ICD-10-CM

## 2013-09-18 DIAGNOSIS — F0390 Unspecified dementia without behavioral disturbance: Secondary | ICD-10-CM

## 2013-09-18 NOTE — Plan of Care (Signed)
Problem: RH BLADDER ELIMINATION Goal: RH STG MANAGE BLADDER WITH ASSISTANCE STG Manage Bladder With Total Assistance  Outcome: Not Progressing Pt has supra-pubic catheter that staff provides care for.wbb

## 2013-09-18 NOTE — Progress Notes (Signed)
Physical Therapy Session Note  Patient Details  Name: ZARION OLIFF MRN: [id number] Date of Birth: 1925/05/03  Today's Date: 09/18/2013 Time: 1045-1130 and 5176-1607 (full time 1300-1345 cotreat with [facility]) and 3710-6269 Time Calculation (min): 45 min and 15 min and 45 min  Short Term Goals: Week 2:  PT Short Term Goal 1 (Week 2): Pt will perform bed mobility with mod A and max verbal cues for sequencing PT Short Term Goal 1 - Progress (Week 2): Progressing toward goal PT Short Term Goal 2 (Week 2): Pt will performed bed <> w/c to L and R with mod A and max verbal cues for sequencing PT Short Term Goal 2 - Progress (Week 2): Progressing toward goal PT Short Term Goal 3 (Week 2): Pt will perform w/c mobility x 100' in controlled environment with bilat feet propulsion with mod A PT Short Term Goal 3 - Progress (Week 2): Progressing toward goal PT Short Term Goal 4 (Week 2): Pt will perform gait with RW x 25' with max A of one person and max verbal cues PT Short Term Goal 4 - Progress (Week 2): Progressing toward goal PT Short Term Goal 5 (Week 2): Pt will ascend/descend 3 stairs with 2 rails with max A of one person PT Short Term Goal 5 - Progress (Week 2): Met Week 3:  PT Short Term Goal 1 (Week 3): = LTG which have been downgraded secondary to lack of progress  Skilled Therapeutic Interventions/Progress Updates:   Pt in w/c donning shoes with caregiver.  Pt attempting to place RLE on LLE to don and strap shoe but unable to flex hip enough; cued pt to perform full anterior lean to strap shoe.  Transferred to gym in w/c total A.  Transferred w/c <> mat with stand pivot with RW and mod-max A with lifting assistance, lateral weight shifting and verbal cues for pivoting sequence and assistance to management RW and for controlled stand > sit.  Pt with improved lateral stepping and weight shifting to R today.  Also performed sit <> supine and rolling supine > L with mod A with max verbal cues for  sequence.     On mat performed bilat LE ROM, strengthening exercises in sitting, supine and L sidelying with focus on stabilization through RLE during LLE exercise and sustained activation/strengthening of RLE.  Peformed 12 reps each LE: ankle DF/PF, LAQ, hip IR, heel slides, glutes sets, SAQ, hip ABD in supine and sidelying.  Copies of exercises given to caregiver to perform in the room in between therapies.   Second session: cotreat with OT with focus on discussion with caregiver re: bathroom set up, accessibility with w/c or RW, sequencing entering/exiting bathroom, planning home evaluation for next week and practicing lateral stepping with long wall rail to L and R x 25' each direction with +2 for safety with mod-max A for lateral weight shift and verbal cues for sequencing with one sitting rest break.    Third session: Focus on continued stair and gait training for repetition of automatic/familiar tasks to facilitate muscle memory and carry over day to day.  Performed stair negotiation x 2 reps up/down 5 stairs with 2 rails with alternating sequence to ascend and step to sequence to descend with intermittent alternating sequence with mod A overall for L lateral and anterior weight shifting and verbal cues for full RLE clearance and sequencing.  Continued gait training with RW x 75' + 75' with mod A on R side with only min A  needed for sit > stand but assistance required during gait for safe RW management, L lateral weight shift and max verbal cues to maintain full step length bilaterally and R foot clearance. Increased R foot drag and R lateropulsion noted as pt fatigued.  Returned to room to sit EOB to drink water.  Pt to return to supine with caregiver.  Discussed performing real car transfer on Monday and then decide if pt will be able to efficiently transfer for outpatient PT at D/C.  Discussed energy conservation and that it may deplete pt energy with multiple trips in/out of the house and car.  Will  continue to assess.  Also discussed equipment needs and possible need for manual w/c at D/C instead of usual transport w/c.    Therapy Documentation Precautions:  Precautions Precautions: Fall Restrictions Weight Bearing Restrictions: No Pain: Pain Assessment Pain Assessment: No/denies pain  See FIM for current functional status  Therapy/Group: Individual Therapy and Co-Treatment  [name] [facility] 09/18/2013, 12:17 PM

## 2013-09-18 NOTE — Progress Notes (Signed)
Occupational Therapy Session Note  Patient Details  Name: Matthew Hodge MRN: [id number] Date of Birth: 1925/03/27  Today's Date: 09/18/2013 Time: 0915-1020 and 1300-1330 (co-tx with PT [id number]) Time Calculation (min): 65 min and 30 min  Short Term Goals: Week 3:  OT Short Term Goal 1 (Week 3): Pt will complete sit <> stand for LB bathing with mod assist OT Short Term Goal 2 (Week 3): Pt will complete LB dressing with max assist of one caregiver OT Short Term Goal 3 (Week 3): Pt will complete sidestepping to Rt with mod assist to simulate stepping out of walk-in shower  Skilled Therapeutic Interventions/Progress Updates:    1) Pt seen for ADL retraining with focus on increasing participation in functional tasks of bathing and dressing. Pt received up in w/c. Completed sit > stand from w/c with use of grab bar and performed stand step transfer to shower chair with max assist and cues for sidestepping with RLE, pt with increased ability to advance RLE with sidestepping to Lt this session. Pt with increased sequencing with bathing in more familiar environment, however continues to require verbal cues as he will say "I don't know what to do here" and requiring mod assist to maintain standing to wash buttocks, pt with improved standing balance with increased focus on RLE placement and hand placement on grab bars. Pt required max assist transfer out of shower secondary to pushing Rt and increased cues to advance RLE when stepping to Rt. LB dressing required max encouragement to attempt threading pant legs and fasten shoes due to insecurity leaning forward and fearfulness. Max assist sit > stand at sink with UE support on sink, use of mirror to promote upright standing with 2nd person to pull pants over hips, focused on alternating to only one UE support while attempting to pull up brief and pants.  2) Pt seen for skilled co-treat with PT with focus on sidestepping to Rt and Lt, needed to access toilet  and shower in small bathroom at home.  Dimensions provided from caregiver to further prepare pt for mobility in home environment.  Width of bathroom only 22" and 18" where toilet juts out from wall, pt will have to sidestep to access toilet and shower.  Grab bars affixed to sink across from toilet.  Sidestepping along rail in hallway approx 30 feet to Lt and then to Rt, requiring increased cues and visual target when stepping to Rt due to decreased motor control and recall when stepping out with RLE. +2 with sidestepping for safety and to provide both physical assist and providing target with mobility.  Discussed home eval with caregiver and carryover of focus during therapy to home environment.  Therapy Documentation Precautions:  Precautions Precautions: Fall Restrictions Weight Bearing Restrictions: No Pain:  Pt with no c/o pain  See FIM for current functional status  Therapy/Group: Individual Therapy  Simonne Come 09/18/2013, 10:59 AM

## 2013-09-18 NOTE — Progress Notes (Signed)
Subjective/Complaints: 80 y.o. right-handed male was documented history of Alzheimer's disease. Patient lives with his wife and used a cane prior to admission. Patient on no scheduled medications prior to admission. Admitted 08/27/2013 with right side weakness and altered mental status. Cranial CT scan showed no acute abnormalities. Patient did receive TPA. MRI of the brain showed small patchy areas of acute infarct in the left parietal lobe. Possible 5 mm acute infarct right parietal white matter. MRA of the head with no large vessel occlusion. Echocardiogram with ejection fraction of 55% and normal systolic function.   Brighter, R shoulder blade stings after bed repositioning Appears comfortable and pleasant in bed  Review of Systems - incomplete secondary to cognitive issues  Objective: Vital Signs: Blood pressure 122/86, pulse 71, temperature 97.9 F (36.6 C), temperature source Oral, resp. rate 17, height 6' (1.829 m), weight 81.194 kg (179 lb), SpO2 98.00%. No results found. No results found for this or any previous visit (from the past 72 hour(s)).   HEENT: normal Cardio: RRR and no murmurs Resp: CTA B/L and unlabored GI: BS positive and ND, suprapubic cath site intact/clean Extremity:  Pulses positive and No Edema Skin:   Intact, no rash on back Neuro: pleasantly confused, Cranial Nerve II-XII normal, Abnormal Sensory cannot fully assess but grossly intact LT B UE and BLE, Abnormal Motor 4/5 Bilateral delt bi tri grip HF KE ADF and Abnormal FMC Ataxic/ dec FMC Musc/Skel:  Other bilateral shoulder contracture, bilateral hamstrings contracture, no pain with Right shoulder ROM Gen NAD   Assessment/Plan: 1. Functional deficits secondary to R MCA infarct superimposed on chronic deconditioning which require 3+ hours per day of interdisciplinary therapy in a comprehensive inpatient rehab setting. Physiatrist is providing close team supervision and 24 hour management of active medical  problems listed below. Physiatrist and rehab team continue to assess barriers to discharge/monitor patient progress toward functional and medical goals.  FIM: FIM - Bathing Bathing Steps Patient Completed: Chest;Right Arm;Left Arm;Abdomen;Front perineal area;Right upper leg;Left upper leg Bathing: 3: Mod-Patient completes 5-7 74f 10 parts or 50-74%  FIM - Upper Body Dressing/Undressing Upper body dressing/undressing steps patient completed: Thread/unthread right sleeve of pullover shirt/dresss;Thread/unthread left sleeve of pullover shirt/dress;Put head through opening of pull over shirt/dress;Pull shirt over trunk Upper body dressing/undressing: 5: Set-up assist to: Obtain clothing/put away FIM - Lower Body Dressing/Undressing Lower body dressing/undressing steps patient completed: Pull underwear up/down;Thread/unthread left pants leg;Fasten/unfasten right shoe;Fasten/unfasten left shoe Lower body dressing/undressing: 1: Two helpers  FIM - Musician Devices: Grab bar or rail for support Toileting: 1: Two helpers  FIM - Radio producer Devices: Product manager Transfers: 2-To toilet/BSC: Max A (lift and lower assist)  FIM - Control and instrumentation engineer Devices: Copy: 2: Bed > Chair or W/C: Max A (lift and lower assist);2: Chair or W/C > Bed: Max A (lift and lower assist)  FIM - Locomotion: Wheelchair Distance: 25 Locomotion: Wheelchair: 1: Total Assistance/staff pushes wheelchair (Pt<25%) FIM - Locomotion: Ambulation Locomotion: Ambulation Assistive Devices: Parallel bars Ambulation/Gait Assistance: 3: Mod assist Locomotion: Ambulation: 1: Travels less than 50 ft with moderate assistance (Pt: 50 - 74%)  Comprehension Comprehension Mode: Auditory Comprehension: 4-Understands basic 75 - 89% of the time/requires cueing 10 - 24% of the time  Expression Expression Mode: Verbal Expression:  3-Expresses basic 50 - 74% of the time/requires cueing 25 - 50% of the time. Needs to repeat parts of sentences.  Social Interaction Social Interaction: 3-Interacts appropriately 50 -  74% of the time - May be physically or verbally inappropriate.  Problem Solving Problem Solving: 3-Solves basic 50 - 74% of the time/requires cueing 25 - 49% of the time  Memory Memory: 1-Recognizes or recalls less than 25% of the time/requires cueing greater than 75% of the time  Medical Problem List and Plan:  1. Thrombotic Right MCA infarct  2. DVT Prophylaxis/Anticoagulation: SCDs.Monitor for any signs of DVT.  3. Pain Management: Tylenol as needed  4. 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 Worsening after bilateral parietal infarcts - Bed alarm for safety  - trial of aricept with minimal effect, have increased to 10mg  with no SE thus far 5. Neuropsych: This patient is not capable of making decisions on his own behalf. 6.  Hx bladder outlet obstruction chronic supr pubic cath  LOS (Days) 17 A FACE TO FACE EVALUATION WAS PERFORMED  KIRSTEINS,ANDREW E 09/18/2013, 7:18 AM

## 2013-09-18 NOTE — Plan of Care (Signed)
Problem: RH PAIN MANAGEMENT Goal: RH STG PAIN MANAGED AT OR BELOW PT'S PAIN GOAL 3 or less on scale of 1-10  Outcome: Progressing No c/o pain at this time.

## 2013-09-19 DIAGNOSIS — F0390 Unspecified dementia without behavioral disturbance: Secondary | ICD-10-CM

## 2013-09-19 DIAGNOSIS — R339 Retention of urine, unspecified: Secondary | ICD-10-CM

## 2013-09-19 DIAGNOSIS — I6350 Cerebral infarction due to unspecified occlusion or stenosis of unspecified cerebral artery: Secondary | ICD-10-CM

## 2013-09-19 MED ORDER — SENNOSIDES-DOCUSATE SODIUM 8.6-50 MG PO TABS
2.0000 | ORAL_TABLET | Freq: Every day | ORAL | Status: DC
  Administered 2013-09-20 – 2013-09-26 (×7): 2 via ORAL
  Filled 2013-09-19 (×8): qty 2

## 2013-09-19 NOTE — Progress Notes (Signed)
Patient ID: Matthew Hodge, male   DOB: Aug 08, 1925, 79 y.o.   MRN: [id number] Matthew Hodge is a 80 y.o. male [birth year] [id number]  Subjective: No new complaints. No new problems. Slept well. Feeling OK.  Objective: Vital signs in last 24 hours: Temp:  [97.4 F (36.3 C)] 97.4 F (36.3 C) (01/31 0520) Pulse Rate:  [78] 78 (01/31 0520) Resp:  [18] 18 (01/31 0520) BP: (171)/(87) 171/87 mmHg (01/31 0520) SpO2:  [98 %] 98 % (01/31 0520) Weight change:  Last BM Date: 09/17/13  Intake/Output from previous day: 01/30 0701 - 01/31 0700 In: 600 [P.O.:600] Out: 2075 [Urine:2075]  Physical Exam General: No apparent distress   Eating breakfast without problems  Very pleasant Lungs: Normal effort. Lungs clear to auscultation, no crackles or wheezes. Cardiovascular: Regular rate and rhythm, no edema Musculoskeletal:  Neurovascularly intact Neurological: No new neurological deficits   Lab Results: BMET    Component Value Date/Time   NA 137 09/02/2013 0620   K 4.2 09/02/2013 0620   CL 101 09/02/2013 0620   CO2 25 09/02/2013 0620   GLUCOSE 114* 09/02/2013 0620   BUN 21 09/02/2013 0620   CREATININE 1.04 09/02/2013 0620   CREATININE 1.11 01/19/2013 1023   CALCIUM 9.1 09/02/2013 0620   GFRNONAA 63* 09/02/2013 0620   GFRAA 73* 09/02/2013 0620   CBC    Component Value Date/Time   WBC 6.9 09/02/2013 0620   RBC 4.17* 09/02/2013 0620   HGB 13.1 09/02/2013 0620   HCT 37.9* 09/02/2013 0620   PLT 284 09/02/2013 0620   MCV 90.9 09/02/2013 0620   MCH 31.4 09/02/2013 0620   MCHC 34.6 09/02/2013 0620   RDW 12.9 09/02/2013 0620   LYMPHSABS 0.9 09/02/2013 0620   MONOABS 0.7 09/02/2013 0620   EOSABS 0.6 09/02/2013 0620   BASOSABS 0.0 09/02/2013 0620   CBG's (last 3):  No results found for this basename: GLUCAP,  in the last 72 hours LFT's Lab Results  Component Value Date   ALT 11 09/02/2013   AST 21 09/02/2013   ALKPHOS 80 09/02/2013   BILITOT 0.3 09/02/2013    Studies/Results: No results  found.  Medications:  I have reviewed the patient's current medications. Scheduled Medications: . antiseptic oral rinse  15 mL Mouth Rinse BID  . aspirin  325 mg Oral Daily  . donepezil  5 mg Oral QHS  . pantoprazole  40 mg Oral Daily  . senna-docusate  1 tablet Oral Daily  . simvastatin  20 mg Oral q1800   PRN Medications: acetaminophen, acetaminophen, ondansetron (ZOFRAN) IV, ondansetron, sorbitol  Assessment/Plan: Active Problems:   CVA (cerebral infarction) Thrombotic Right MCA infarct  2. DVT Prophylaxis/Anticoagulation: SCDs.Monitor for any signs of DVT.  3. Pain Management: Tylenol as needed  4. Mood/dementia:[name] prior to CVA. Exacerbated by bilateral parietal infarcts  - Bed alarm for safety  - trial of aricept with minimal effect, increased to 10mg  with no SE thus far  5. Neuropsych: This patient is not capable of making decisions on his own behalf.  6. Hx bladder outlet obstruction chronic supr pubic cath   Length of stay, days: 18    Achol Azpeitia A. Asa Lente, MD 09/19/2013, 8:03 AM

## 2013-09-20 ENCOUNTER — Inpatient Hospital Stay (HOSPITAL_COMMUNITY): Payer: Medicare Other | Admitting: *Deleted

## 2013-09-20 LAB — URINALYSIS, ROUTINE W REFLEX MICROSCOPIC
Bilirubin Urine: NEGATIVE
GLUCOSE, UA: NEGATIVE mg/dL
Ketones, ur: NEGATIVE mg/dL
Nitrite: NEGATIVE
PROTEIN: NEGATIVE mg/dL
SPECIFIC GRAVITY, URINE: 1.007 (ref 1.005–1.030)
Urobilinogen, UA: 0.2 mg/dL (ref 0.0–1.0)
pH: 6.5 (ref 5.0–8.0)

## 2013-09-20 LAB — URINE MICROSCOPIC-ADD ON

## 2013-09-20 NOTE — Progress Notes (Signed)
Physical Therapy Session Note  Patient Details  Name: Matthew Hodge MRN: [id number] Date of Birth: Feb 28, 1925  Today's Date: 09/20/2013 Time: 1015-1100 Time Calculation (min): 45 min   Skilled Therapeutic Interventions/Progress Updates:  Patient in room with caregiver present, caregiver stated that patient has already performed 5x sit to stand and ROM exercises with him and is compliant with program left by the therapist. W/C propulsion to the gym with mod A and increased need for cues to continue activity.-hand over hand demonstration to increase participation. NuStep 2 x 5 min level 4 and 5 ,to increase muscular strength and weight shifting. Transfer training with max A w/cto nustep and sit to stand. Resistive ROM to B LE with use of 2 lbs ankle weights, increased need for cues. Activities in standing in front of the table with max A, and for short time due inability to selectively distribute weight, patient continues to attempt to stand on R LE only. With increased amount of cues both verbal and tactile standing improves in quality but level of assistance continues to be max. Patient returned to room and left with the caregiver. No complains of pain during this session.   Therapy Documentation Precautions:  Precautions Precautions: Fall Restrictions Weight Bearing Restrictions: No   See FIM for current functional status  Therapy/Group: Individual Therapy  Guadlupe Spanish 09/20/2013, 12:41 PM

## 2013-09-20 NOTE — Progress Notes (Signed)
Patient ID: Matthew Hodge, male   DOB: [birth year], 80 y.o.   MRN: [id number] SYD Hodge is a 80 y.o. male Sep 27, 1924 [id number]  Subjective: No new complaints. No new problems. Feeling OK Nursing notes cloudy and strong urine odor  Objective: Vital signs in last 24 hours: Temp:  [97.3 F (36.3 C)-97.4 F (36.3 C)] 97.3 F (36.3 C) (02/01 0500) Pulse Rate:  [73-81] 73 (02/01 0500) Resp:  [18-19] 19 (02/01 0500) BP: (135-137)/(75-82) 135/82 mmHg (02/01 0500) SpO2:  [97 %-98 %] 97 % (02/01 0500) Weight change:  Last BM Date: 09/19/13  Intake/Output from previous day: 01/31 0701 - 02/01 0700 In: 720 [P.O.:720] Out: 1625 [Urine:1625]  Physical Exam General: No apparent distress    Lungs: Normal effort. Lungs clear to auscultation, no crackles or wheezes. Cardiovascular: Regular rate and rhythm, no edema Musculoskeletal:  Neurovascularly intact Neurological: No new neurological deficits   Lab Results: BMET    Component Value Date/Time   NA 137 09/02/2013 0620   K 4.2 09/02/2013 0620   CL 101 09/02/2013 0620   CO2 25 09/02/2013 0620   GLUCOSE 114* 09/02/2013 0620   BUN 21 09/02/2013 0620   CREATININE 1.04 09/02/2013 0620   CREATININE 1.11 01/19/2013 1023   CALCIUM 9.1 09/02/2013 0620   GFRNONAA 63* 09/02/2013 0620   GFRAA 73* 09/02/2013 0620   CBC    Component Value Date/Time   WBC 6.9 09/02/2013 0620   RBC 4.17* 09/02/2013 0620   HGB 13.1 09/02/2013 0620   HCT 37.9* 09/02/2013 0620   PLT 284 09/02/2013 0620   MCV 90.9 09/02/2013 0620   MCH 31.4 09/02/2013 0620   MCHC 34.6 09/02/2013 0620   RDW 12.9 09/02/2013 0620   LYMPHSABS 0.9 09/02/2013 0620   MONOABS 0.7 09/02/2013 0620   EOSABS 0.6 09/02/2013 0620   BASOSABS 0.0 09/02/2013 0620   CBG's (last 3):  No results found for this basename: GLUCAP,  in the last 72 hours LFT's Lab Results  Component Value Date   ALT 11 09/02/2013   AST 21 09/02/2013   ALKPHOS 80 09/02/2013   BILITOT 0.3 09/02/2013     Studies/Results: No results found.  Medications:  I have reviewed the patient's current medications. Scheduled Medications: . antiseptic oral rinse  15 mL Mouth Rinse BID  . aspirin  325 mg Oral Daily  . donepezil  5 mg Oral QHS  . pantoprazole  40 mg Oral Daily  . senna-docusate  2 tablet Oral Daily  . simvastatin  20 mg Oral q1800   PRN Medications: acetaminophen, acetaminophen, ondansetron (ZOFRAN) IV, ondansetron, sorbitol  Assessment/Plan: Active Problems:   CVA (cerebral infarction) Thrombotic Right MCA infarct  2. DVT Prophylaxis/Anticoagulation: SCDs.Monitor for any signs of DVT.  3. Pain Management: Tylenol as needed  4. Mood/dementia:[name] prior to CVA. Exacerbated by bilateral parietal infarcts  - Bed alarm for safety  - trial of aricept with minimal effect, increased to 10mg  with no SE thus far  5. Neuropsych: This patient is not capable of making decisions on his own behalf.  6. Hx bladder outlet obstruction chronic supr pubic cath - ?UTI with urine changes noted by nursing - send for UA and Ucx   Length of stay, days: 19    Valerie A. Asa Lente, MD 09/20/2013, 9:22 AM

## 2013-09-21 ENCOUNTER — Inpatient Hospital Stay (HOSPITAL_COMMUNITY): Payer: Medicare Other | Admitting: Physical Therapy

## 2013-09-21 ENCOUNTER — Inpatient Hospital Stay (HOSPITAL_COMMUNITY): Payer: Medicare Other | Admitting: Occupational Therapy

## 2013-09-21 DIAGNOSIS — R339 Retention of urine, unspecified: Secondary | ICD-10-CM

## 2013-09-21 DIAGNOSIS — I6350 Cerebral infarction due to unspecified occlusion or stenosis of unspecified cerebral artery: Secondary | ICD-10-CM

## 2013-09-21 DIAGNOSIS — F0390 Unspecified dementia without behavioral disturbance: Secondary | ICD-10-CM

## 2013-09-21 MED ORDER — CEPHALEXIN 250 MG PO CAPS
250.0000 mg | ORAL_CAPSULE | Freq: Three times a day (TID) | ORAL | Status: DC
  Administered 2013-09-21 – 2013-09-22 (×3): 250 mg via ORAL
  Filled 2013-09-21 (×6): qty 1

## 2013-09-21 NOTE — Progress Notes (Signed)
Occupational Therapy Session Note  Patient Details  Name: Matthew Hodge MRN: [id number] Date of Birth: 04/11/1925  Today's Date: 09/21/2013 Time: 1020-1120 and 1400-1430 (co-tx with PT 1330-1430) Time Calculation (min): 60 min and 30 min  Short Term Goals: Week 3:  OT Short Term Goal 1 (Week 3): Pt will complete sit <> stand for LB bathing with mod assist OT Short Term Goal 2 (Week 3): Pt will complete LB dressing with max assist of one caregiver OT Short Term Goal 3 (Week 3): Pt will complete sidestepping to Rt with mod assist to simulate stepping out of walk-in shower  Skilled Therapeutic Interventions/Progress Updates:    1) Pt seen for ADL retraining with focus on increased participation of caregiver with self-care tasks and increasing participation in functional tasks of bathing and dressing. Pt received up in w/c. Completed sit > stand from w/c with use of grab bar and performed stand step transfer to shower chair with max assist and cues for sidestepping with RLE. Pt in new hospital room with shower oriented to Rt instead of to Lt, causing increased in confusion with transfer and difficulty due to new environment.  Pt continues to require verbal cues with bathing for initiation and sequencing and requiring mod assist to maintain standing while caregiver washed buttocks, pt with improved standing balance with increased focus on RLE placement and hand placement on grab bars. Pt required max assist transfer out of shower secondary to pushing Rt and increased cues to advance RLE when stepping to Lt. LB dressing required max encouragement to attempt threading pant legs and fasten shoes due to insecurity leaning forward and fearfulness. Max assist sit > stand at sink with UE support on sink, use of mirror to promote upright standing.  Backwards chaining with threading Rt pant leg.  Pt able to pull pants partly over hips while alternating UE support on sink and pulling pants up with opposite UE.   Caregiver finished pulling up pants while pt stabilized self with UE support on sink and verbal cues for midline standing.  2) Engaged in skilled co-tx with PT with focus on transfer training with actual car and furniture transfers in ADL apartment.  Actual car transfer performed with pt requiring min-mod assist when transferring to Lt to car, however required increased physical assistance and cues to transfer out of car and back to Rt to w/c.  Pt benefits from verbal cue to take big step with Rt foot when turning or sidestepping to Rt.  In ADL apt engaged in transfers from low household surfaces as pt's caregiver reports couch very low.  Pt requires mod-max assist to lift when coming from low surface, educated on increased hand placement to assist with sit > stand.  Increased physical assistance/facilitation for Lt lateral weight shift and verbal cues for sequencing needed for transfers to Rt. Continued discussion with caregiver about follow up therapy and equipment needs.  Therapy Documentation Precautions:  Precautions Precautions: Fall Restrictions Weight Bearing Restrictions: No Pain: Pain Assessment Pain Assessment: No/denies pain Pain Score: 0-No pain  See FIM for current functional status  Therapy/Group: Individual Therapy and Co-Treatment  Matthew Hodge, [facility] 09/21/2013, 12:16 PM

## 2013-09-21 NOTE — Progress Notes (Signed)
Subjective/Complaints: 80 y.o. right-handed male was documented history of Alzheimer's disease. Patient lives with his wife and used a cane prior to admission. Patient on no scheduled medications prior to admission. Admitted 08/27/2013 with right side weakness and altered mental status. Cranial CT scan showed no acute abnormalities. Patient did receive TPA. MRI of the brain showed small patchy areas of acute infarct in the left parietal lobe. Possible 5 mm acute infarct right parietal white matter. MRA of the head with no large vessel occlusion. Echocardiogram with ejection fraction of 55% and normal systolic function.   Eating well up in [place], no issues overnite, caregiver at bedside  Review of Systems - incomplete secondary to cognitive issues  Objective: Vital Signs: Blood pressure 146/85, pulse 84, temperature 97.5 F (36.4 C), temperature source Oral, resp. rate 18, height 6' (1.829 m), weight 81.194 kg (179 lb), SpO2 95.00%. No results found. Results for orders placed during the hospital encounter of 09/01/13 (from the past 72 hour(s))  URINALYSIS, ROUTINE W REFLEX MICROSCOPIC     Status: Abnormal   Collection Time    09/20/13  4:39 PM      Result Value Range   Color, Urine YELLOW  YELLOW   APPearance CLOUDY (*) CLEAR   Specific Gravity, Urine 1.007  1.005 - 1.030   pH 6.5  5.0 - 8.0   Glucose, UA NEGATIVE  NEGATIVE mg/dL   Hgb urine dipstick SMALL (*) NEGATIVE   Bilirubin Urine NEGATIVE  NEGATIVE   Ketones, ur NEGATIVE  NEGATIVE mg/dL   Protein, ur NEGATIVE  NEGATIVE mg/dL   Urobilinogen, UA 0.2  0.0 - 1.0 mg/dL   Nitrite NEGATIVE  NEGATIVE   Leukocytes, UA LARGE (*) NEGATIVE  URINE MICROSCOPIC-ADD ON     Status: None   Collection Time    09/20/13  4:39 PM      Result Value Range   WBC, UA 21-50  <3 WBC/hpf   RBC / HPF 0-2  <3 RBC/hpf   Bacteria, UA RARE  RARE     HEENT: normal Cardio: RRR and no murmurs Resp: CTA B/L and unlabored GI: BS positive and ND, suprapubic cath  site intact/clean Extremity:  Pulses positive and No Edema Skin:   Intact, no rash on back Neuro: pleasantly confused, Cranial Nerve II-XII normal, Abnormal Sensory cannot fully assess but grossly intact LT B UE and BLE, Abnormal Motor 4/5 Bilateral delt bi tri grip HF KE ADF and Abnormal FMC Ataxic/ dec FMC Musc/Skel:  Other bilateral shoulder contracture, bilateral hamstrings contracture, no pain with Right shoulder ROM Gen NAD   Assessment/Plan: 1. Functional deficits secondary to R MCA infarct superimposed on chronic deconditioning which require 3+ hours per day of interdisciplinary therapy in a comprehensive inpatient rehab setting. Physiatrist is providing close team supervision and 24 hour management of active medical problems listed below. Physiatrist and rehab team continue to assess barriers to discharge/monitor patient progress toward functional and medical goals.  FIM: FIM - Bathing Bathing Steps Patient Completed: Chest;Right Arm;Left Arm;Abdomen;Front perineal area;Right upper leg;Left upper leg Bathing: 3: Mod-Patient completes 5-7 73f 10 parts or 50-74%  FIM - Upper Body Dressing/Undressing Upper body dressing/undressing steps patient completed: Thread/unthread right sleeve of pullover shirt/dresss;Thread/unthread left sleeve of pullover shirt/dress;Put head through opening of pull over shirt/dress;Pull shirt over trunk Upper body dressing/undressing: 5: Set-up assist to: Obtain clothing/put away FIM - Lower Body Dressing/Undressing Lower body dressing/undressing steps patient completed: Thread/unthread right pants leg;Thread/unthread left pants leg;Fasten/unfasten right shoe;Fasten/unfasten left shoe Lower body dressing/undressing: 1:  Two helpers  [place]: Grab bar or rail for support Toileting: 1: Two helpers  FIM - Radio producer Devices: Product manager Transfers: 1-Two helpers  FIM - Financial trader Devices: Arm rests Bed/Chair Transfer: 2: Chair or W/C > Bed: Max A (lift and lower assist)  FIM - Locomotion: Wheelchair Distance: 25 Locomotion: Wheelchair: 1: Travels less than 50 ft with moderate assistance (Pt: 50 - 74%) FIM - Locomotion: Ambulation Locomotion: Ambulation Assistive Devices: Parallel bars Ambulation/Gait Assistance: 3: Mod assist Locomotion: Ambulation: 0: Activity did not occur  Comprehension Comprehension Mode: Auditory Comprehension: 4-Understands basic 75 - 89% of the time/requires cueing 10 - 24% of the time  Expression Expression Mode: Verbal Expression: 4-Expresses basic 75 - 89% of the time/requires cueing 10 - 24% of the time. Needs helper to occlude trach/needs to repeat words.  Social Interaction Social Interaction: 5-Interacts appropriately 90% of the time - Needs monitoring or encouragement for participation or interaction.  Problem Solving Problem Solving: 3-Solves basic 50 - 74% of the time/requires cueing 25 - 49% of the time  Memory Memory: 1-Recognizes or recalls less than 25% of the time/requires cueing greater than 75% of the time  Medical Problem List and Plan:  1. Thrombotic Right MCA infarct  2. DVT Prophylaxis/Anticoagulation: SCDs.Monitor for any signs of DVT.  3. Pain Management: Tylenol as needed  4. 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 Worsening after bilateral parietal infarcts - Bed alarm for safety  - trial of aricept with minimal effect, have increased to 10mg  with no SE thus far 5. Neuropsych: This patient is not capable of making decisions on his own behalf. 6.  Hx bladder outlet obstruction chronic supr pubic cath  LOS (Days) 20 A FACE TO FACE EVALUATION WAS PERFORMED  KIRSTEINS,ANDREW E 09/21/2013, 8:52 AM

## 2013-09-21 NOTE — Progress Notes (Signed)
Physical Therapy Session Note  Patient Details  Name: Matthew Hodge MRN: [id number] Date of Birth: [birth year]  Today's Date: 09/21/2013 Time: 0903-1003 and 1134-1203 and 1330-1400 (full time 1330-1430 cotreat with [facility]) Time Calculation (min): 60 min and 29 min and 30 min   Short Term Goals: Week 3:  PT Short Term Goal 1 (Week 3): = LTG which have been downgraded secondary to lack of progress  Skilled Therapeutic Interventions/Progress Updates:   Session 1:  Caregiver present.  Pt finishing breakfast.  While pt finishing breakfast discussed with caregiver and patient plan for therapy sessions today including actual car transfer this pm, gait to/from gym with RW, assessment of gait with orthosis in R shoe for improved foot clearance forward progression of COG and knee extension control in stance, home evaluation Tuesday vs. Wednesday, outpatient PT vs. HHPT and energy expenditure, and equipment needs.  Once pt finished with breakfast performed gait x 100' +100' with RW and max A with increased pushing noted today and increased assistance needed for L lateral weight shifting; use of wheels on RW as visual target for bilat LE step length.  Required one sitting rest break.  Donned R blue rocker with 1/4 inch heel wedge and performed gait with RW x 30' with improved forward weight shift and R foot clearance if pt able to adequately shift weight to L.  Performed stair negotiation with AFO donned with bilat UE support on rails and max A for forwards and L lateral weight shifting up/down 5 stairs with step to and alternating stepping sequence; demonstrated improved R foot clearance but still requires assistance for full RLE extension to advance COG up and forwards when ascending with RLE.  Returned to w/c; pt denied pain or discomfort with AFO and did not remember it was on his LE.  Will continue to assess.     Session 2:  Continued gait training with R blue rocker donned x 50' + 41' with RW as well as R and  L lateral stepping x 25' each direction with bilat UE support on wall rail.  Pt required mod-max A for gait forwards and laterally still requiring max verbal cues and visual cues of RW wheels for full lateral and anterior weight shifting and full step length bilaterally.  As pt fatigued he demonstrated decreased ability to laterally weight shift and advance RLE.    Session 3: Co-treat with OT with focus on transfer training w/c <> actual car and functional furniture transfers in ADL apartment stand pivot with RW.  Pt performed car transfer with min-mod A stand pivot to the car with automatic pivot to L and min A to bring RLE into and out of the car with min verbal cues for sequencing.  Required increased physical assistance to fully pivot and shift weight out of the car and max A to stand from low car and pivot to the R with UE support on RW.  Returned to ADL apartment and practiced stand pivot transfers with RW w/c > couch > arm chair > w/c all to the R and to focus on transfers from lower household surfaces with mod-max A overall. Increased physical assistance/facilitation for L lateral weight shift and verbal cues for sequencing needed for transfers to R.  Continued discussion with caregiver about f/u therapy and equipment needs.  Will need to have caregiver do hands on for car transfer prior to D/C.    Therapy Documentation Precautions:  Precautions Precautions: Fall Restrictions Weight Bearing Restrictions: No Pain: Pain Assessment  Pain Assessment: No/denies pain Pain Score: 0-No pain Locomotion : Ambulation Ambulation/Gait Assistance: 3: Mod assist;2: Max assist   See FIM for current functional status  Therapy/Group: Individual Therapy and Co-Treatment  [name] Faucette 09/21/2013, 12:10 PM

## 2013-09-21 NOTE — Plan of Care (Signed)
Problem: RH BOWEL ELIMINATION Goal: RH STG MANAGE BOWEL WITH ASSISTANCE STG Manage Bowel with max Assistance.  Outcome: Progressing Cont of bowel, senna increased to 2 tabs q am due to hard stool.

## 2013-09-22 ENCOUNTER — Inpatient Hospital Stay (HOSPITAL_COMMUNITY): Payer: Medicare Other | Admitting: Physical Therapy

## 2013-09-22 ENCOUNTER — Inpatient Hospital Stay (HOSPITAL_COMMUNITY): Payer: Medicare Other | Admitting: Occupational Therapy

## 2013-09-22 LAB — BASIC METABOLIC PANEL
BUN: 21 mg/dL (ref 6–23)
CO2: 25 meq/L (ref 19–32)
CREATININE: 1.16 mg/dL (ref 0.50–1.35)
Calcium: 9 mg/dL (ref 8.4–10.5)
Chloride: 97 mEq/L (ref 96–112)
GFR calc Af Amer: 64 mL/min — ABNORMAL LOW (ref >90)
GFR calc non Af Amer: 55 mL/min — ABNORMAL LOW (ref >90)
GLUCOSE: 119 mg/dL — AB (ref 70–99)
POTASSIUM: 4.5 meq/L (ref 3.7–5.3)
Sodium: 136 mEq/L — ABNORMAL LOW (ref 137–147)

## 2013-09-22 MED ORDER — CEFIXIME 400 MG PO TABS
400.0000 mg | ORAL_TABLET | Freq: Every day | ORAL | Status: DC
  Administered 2013-09-22: 400 mg via ORAL
  Filled 2013-09-22 (×3): qty 1

## 2013-09-22 NOTE — Progress Notes (Signed)
Occupational Therapy Session Note  Patient Details  Name: Matthew Hodge MRN: [id number] Date of Birth: 01-14-1925  Today's Date: 09/22/2013 Time: 1100-1200 and 1305-1330 Time Calculation (min): 60 min and 25 min  Short Term Goals: Week 3:  OT Short Term Goal 1 (Week 3): Pt will complete sit <> stand for LB bathing with mod assist OT Short Term Goal 2 (Week 3): Pt will complete LB dressing with max assist of one caregiver OT Short Term Goal 3 (Week 3): Pt will complete sidestepping to Rt with mod assist to simulate stepping out of walk-in shower  Skilled Therapeutic Interventions/Progress Updates:    1) Engaged in ther ex and NM re-ed in unsupported sitting with focus on sitting balance and overall strengthening. Pt unable to cognitively understand various activities, therefore engaged in repetition of ther ex activities to improve participation and carryover with self-care tasks. Engaged in [place] with theraband in unsupported sitting. Encouraged pt to count aloud. Pt with Rt lateral lean in unsupported sitting with pt with increased awareness and ability to correct during exercise. Pt with decreased RUE strength with theraband exercise. Stand pivot max assist w/c <> therapy mat with RW with caregiver providing hands on physical assist and providing cues with therapist providing additional cues to increase safety with transfer.   2) Engaged in hands on education with caregiver with focus on functional mobility in ADL bathroom with RW and sidestepping with railing.  Ambulated approx 10 feet to toilet with mod assist and mod verbal cues for stepping pattern, increasing when turning to sit on toilet.  Pt's caregiver reports able to manage pt at this level and feels that with continued functional mobility he will continue to improve.  Returned to w/c with mod assist with RW with pt with increased Rt lean when turning to Rt to sit in w/c.  Sidestepped approx 10 feet along  railing in hallway to simulate entry/exit to home bathroom, with caregiver providing physical assist and majority of cues.Therapist provided additional cues to increase safety.  Therapy Documentation Precautions:  Precautions Precautions: Fall Restrictions Weight Bearing Restrictions: No General:   Vital Signs: Therapy Vitals Pulse Rate: 94 BP: 107/62 mmHg Patient Position, if appropriate: Sitting Pain: Pain Assessment Pain Assessment: No/denies pain Pain Score: 0-No pain  See FIM for current functional status  Therapy/Group: Individual Therapy  Simonne Come 09/22/2013, 12:26 PM

## 2013-09-22 NOTE — Progress Notes (Signed)
Social Work Patient ID: Matthew Hodge, male   DOB: 1924-10-21, 80 y.o.   MRN: [id number] Met with pt and [name] did well in PT walking better and with a walker.  Do want the rental wheechair for a short time and is aware of the co-pay. Have also made referral to [facility] for therapy follow up.  Will try to get scheduled same time as his wife is is just starting there.

## 2013-09-22 NOTE — Progress Notes (Signed)
Physical Therapy Weekly Progress Note  Patient Details  Name: Matthew Hodge MRN: [id number] Date of Birth: [birth year]  Today's Date: 09/22/2013 Time: 7353-2992 and 4268-3419 Time Calculation (min): 60 min and 51 min  Patient continues to make steady progress and has met 1 of 12 long term goals.  Short term goals not set due to pt to D/C at end of this week.  Pt is currently mod-max A of one person for bed mobility, bed/furniture<> w/c transfers stand pivot with RW, car transfer stand pivot with RW, gait x 75' with RW, and stair negotiation with bilat rails.  Education and mobility training have been initiated with caregiver.  Caregiver is independent to perform bed mobility, bed <> w/c transfers with pt.  Hands on practice for gait, car transfer and stair negotiation is ongoing.  Therapy to perform home evaluation with pt and caregiver tomorrow to assess safety with mobility within pt's familiar home setting.   Patient continues to demonstrate the following deficits: R hemiparesis with impaired motor control, timing, sequencing and grading of movement, mild R inattention, impaired postural control with posterior and R lateropulsion, impaired balance, gait, impaired cognition and therefore will continue to benefit from skilled PT intervention to enhance overall performance with activity tolerance, balance, postural control, ability to compensate for deficits, functional use of  right upper extremity and right lower extremity, attention and coordination.  See Patient's Care Plan for progression toward long term goals.  Patient progressing toward long term goals..  Plan of care revisions: home ambulation goal reactivated.  Skilled Therapeutic Interventions/Progress Updates:   Discussed with caregiver again about equipment and f/u therapy and plan for therapy today and home evaluation for tomorrow.  Transported to gym in w/c total A.  Discussed with caregiver having him transition to doing stand pivot  transfers with pt with RW each transfer.  Caregiver performed stand pivot with pt w/c > Nustep to R with RW and max A for lifting assistance from w/c, assistance for L lateral weight shift to allow pt to pivot R foot and assistance to control stand > sit.  Pt also requires max-total verbal cues for set up and sequence of transfer.  Performed bilat UE and LE endurance, coordination, ROM and strengthening on Nustep at level 7 x 10 minutes + 2 minutes with LE only for extensor strengthening.  Pt c/o dizziness when spinning seat around but symptoms subsided quickly.  Performed gait training with pt with RW and R blue rocker AFO x 125' and mod A overall with use of verbal and visual cues for full step and stride length bilaterally and to minimize RLE scissoring, and verbal and tactile cues for full L lateral weight shifting.  Had caregiver give return demonstration of gait with RW and AFO x 50' with caregiver providing verbal and tactile cues.  Pt noted during gait to have continued R lateral lean and lateropulsion, R scissoring, shuffling gait, and decreased R hip and knee extension.  Pt c/o dizziness in standing.  Returned to sitting and assessed vitals.  Caregiver reports pt does have UTI and was started on Keflex yesterday.  RN notified of lower BP.  Pt returned to room to rest before OT session.    Second session: Caregiver present for continued gait and stair negotiation training.  Transported to gym in w/c total A.  Caregiver and pt performed up/down 5 stairs with bilat rails with caregiver providing mod A for balance, lateral and anterior weight shifting and verbal cues for safe step  to sequence to ascend and descend.  Discussed stair set up at home. Caregiver reports that from outside > back porch there are 2 steps and that the grab bar is only on R but from back porch > house it is one step with bilat grab bars.  Caregiver reports that pt used to place his RW onto the porch and then step in; discussed  possibility of pt holding R grab bar with bilat UE and ascending and descending laterally.  Performed stair negotiation again with pt with bilat UE support on R rail ascending and descending laterally with mod-max A with total verbal cues for sequencing of ascending with LLE and descending with RLE and assistance for full RLE ABD to give L foot room to descend.  Pt became very confused and frustrated with this method of stair negotiation.  Attempted to calm and reassure pt; will assess further tomorrow during home evaluation.  Pt and caregiver continued gait with RW (which was adjusted down secondary to significant shoulder and elbow flexion during standing) x 50' + 125' with caregiver providing mod A on R side with one hand assisting with slowing momentum of RW and shuffling gait, facilitation of L lateral weight shifting and verbal cues for full step and stride length bilaterally.  Pt to transfer sit > supine in bed with caregiver.      Therapy Documentation Precautions:  Precautions Precautions: Fall Restrictions Weight Bearing Restrictions: No Vital Signs: Therapy Vitals Pulse Rate: 94 BP: 107/62 mmHg Patient Position, if appropriate: Sitting Pain: Pain Assessment Pain Assessment: No/denies pain Pain Score: 0-No pain Locomotion : Ambulation Ambulation/Gait Assistance: 3: Mod assist;2: Max assist   See FIM for current functional status  Therapy/Group: Individual Therapy  [name] Faucette 09/22/2013, 10:09 AM

## 2013-09-22 NOTE — Progress Notes (Signed)
Patient reported to have onset of dizziness with activity since yesterday.  Maybe due to keflex. Will change to suprax--sensitivity pending.

## 2013-09-22 NOTE — Progress Notes (Signed)
Subjective/Complaints: 80 y.o. right-handed male was documented history of Alzheimer's disease. Patient lives with his wife and used a cane prior to admission. Patient on no scheduled medications prior to admission. Admitted 08/27/2013 with right side weakness and altered mental status. Cranial CT scan showed no acute abnormalities. Patient did receive TPA. MRI of the brain showed small patchy areas of acute infarct in the left parietal lobe. Possible 5 mm acute infarct right parietal white matter. MRA of the head with no large vessel occlusion. Echocardiogram with ejection fraction of 55% and normal systolic function.   Eating well up in [place], no issues overnite, caregiver at bedside  Review of Systems - incomplete secondary to cognitive issues  Objective: Vital Signs: Blood pressure 114/69, pulse 83, temperature 97.8 F (36.6 C), temperature source Oral, resp. rate 18, height 6' (1.829 m), weight 81.194 kg (179 lb), SpO2 99.00%. No results found. Results for orders placed during the hospital encounter of 09/01/13 (from the past 72 hour(s))  URINALYSIS, ROUTINE W REFLEX MICROSCOPIC     Status: Abnormal   Collection Time    09/20/13  4:39 PM      Result Value Range   Color, Urine YELLOW  YELLOW   APPearance CLOUDY (*) CLEAR   Specific Gravity, Urine 1.007  1.005 - 1.030   pH 6.5  5.0 - 8.0   Glucose, UA NEGATIVE  NEGATIVE mg/dL   Hgb urine dipstick SMALL (*) NEGATIVE   Bilirubin Urine NEGATIVE  NEGATIVE   Ketones, ur NEGATIVE  NEGATIVE mg/dL   Protein, ur NEGATIVE  NEGATIVE mg/dL   Urobilinogen, UA 0.2  0.0 - 1.0 mg/dL   Nitrite NEGATIVE  NEGATIVE   Leukocytes, UA LARGE (*) NEGATIVE  URINE CULTURE     Status: None   Collection Time    09/20/13  4:39 PM      Result Value Range   Specimen Description URINE, SUPRAPUBIC     Special Requests NONE     Culture  Setup Time       Value: 09/20/2013 20:55     Performed at [facility] Count       Value: >=100,000  COLONIES/ML     Performed at [facility]   Culture       Value: [place]     Performed at [facility]   Report Status PENDING    URINE MICROSCOPIC-ADD ON     Status: None   Collection Time    09/20/13  4:39 PM      Result Value Range   WBC, UA 21-50  <3 WBC/hpf   RBC / HPF 0-2  <3 RBC/hpf   Bacteria, UA RARE  RARE     HEENT: normal Cardio: RRR and no murmurs Resp: CTA B/L and unlabored GI: BS positive and ND, suprapubic cath site intact/clean Extremity:  Pulses positive and No Edema Skin:   Intact, no rash on back Neuro: pleasantly confused, Cranial Nerve II-XII normal, Abnormal Sensory cannot fully assess but grossly intact LT B UE and BLE, Abnormal Motor 4/5 Bilateral delt bi tri grip HF KE ADF and Abnormal FMC Ataxic/ dec FMC Musc/Skel:  Other bilateral shoulder contracture improved flexion, bilateral hamstrings contracture, no pain with  shoulder AROM Gen NAD   Assessment/Plan: 1. Functional deficits secondary to R MCA infarct superimposed on chronic deconditioning which require 3+ hours per day of interdisciplinary therapy in a comprehensive inpatient rehab setting. Physiatrist is providing close team supervision and 24 hour management  of active medical problems listed below. Physiatrist and rehab team continue to assess barriers to discharge/monitor patient progress toward functional and medical goals.  FIM: FIM - Bathing Bathing Steps Patient Completed: Chest;Right Arm;Left Arm;Abdomen;Front perineal area;Right upper leg;Left upper leg Bathing: 3: Mod-Patient completes 5-7 31f 10 parts or 50-74%  FIM - Upper Body Dressing/Undressing Upper body dressing/undressing steps patient completed: Thread/unthread right sleeve of pullover shirt/dresss;Thread/unthread left sleeve of pullover shirt/dress;Put head through opening of pull over shirt/dress;Pull shirt over trunk Upper body dressing/undressing: 5: Set-up assist to: Obtain clothing/put away FIM -  Lower Body Dressing/Undressing Lower body dressing/undressing steps patient completed: Thread/unthread left underwear leg;Thread/unthread left pants leg;Fasten/unfasten right shoe;Fasten/unfasten left shoe Lower body dressing/undressing: 2: Max-Patient completed 25-49% of tasks  FIM - Musician Devices: Grab bar or rail for support Toileting: 1: Two helpers  FIM - Radio producer Devices: Product manager Transfers: 1-Two helpers  FIM - Control and instrumentation engineer Devices: Copy: 2: Chair or W/C > Bed: Max A (lift and lower assist);2: Bed > Chair or W/C: Max A (lift and lower assist)  FIM - Locomotion: Wheelchair Distance: 25 Locomotion: Wheelchair: 1: Total Assistance/staff pushes wheelchair (Pt<25%) FIM - Locomotion: Ambulation Locomotion: Ambulation Assistive Devices: Walker - Rolling;Orthosis Ambulation/Gait Assistance: 3: Mod assist;2: Max assist Locomotion: Ambulation: 2: Travels 50 - 149 ft with maximal assistance (Pt: 25 - 49%)  Comprehension Comprehension Mode: Auditory Comprehension: 4-Understands basic 75 - 89% of the time/requires cueing 10 - 24% of the time  Expression Expression Mode: Verbal Expression: 4-Expresses basic 75 - 89% of the time/requires cueing 10 - 24% of the time. Needs helper to occlude trach/needs to repeat words.  Social Interaction Social Interaction: 5-Interacts appropriately 90% of the time - Needs monitoring or encouragement for participation or interaction.  Problem Solving Problem Solving: 3-Solves basic 50 - 74% of the time/requires cueing 25 - 49% of the time  Memory Memory: 1-Recognizes or recalls less than 25% of the time/requires cueing greater than 75% of the time  Medical Problem List and Plan:  1. Thrombotic Right MCA infarct  2. DVT Prophylaxis/Anticoagulation: SCDs.Monitor for any signs of DVT.  3. Pain Management: Tylenol as needed  4.  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 Worsening after bilateral parietal infarcts - Bed alarm for safety  - trial of aricept with minimal effect, have increased to 10mg  with no SE thus far 5. Neuropsych: This patient is not capable of making decisions on his own behalf. 6.  Hx bladder outlet obstruction chronic supr pubic cath  LOS (Days) 21 A FACE TO FACE EVALUATION WAS PERFORMED  KIRSTEINS,ANDREW E 09/22/2013, 8:19 AM

## 2013-09-23 ENCOUNTER — Inpatient Hospital Stay (HOSPITAL_COMMUNITY): Payer: Medicare Other | Admitting: Physical Therapy

## 2013-09-23 ENCOUNTER — Inpatient Hospital Stay (HOSPITAL_COMMUNITY): Payer: Medicare Other | Admitting: Occupational Therapy

## 2013-09-23 LAB — URINE CULTURE

## 2013-09-23 MED ORDER — CIPROFLOXACIN HCL 250 MG PO TABS
250.0000 mg | ORAL_TABLET | Freq: Two times a day (BID) | ORAL | Status: DC
  Administered 2013-09-23 – 2013-09-26 (×7): 250 mg via ORAL
  Filled 2013-09-23 (×9): qty 1

## 2013-09-23 NOTE — Progress Notes (Signed)
Social Work Patient ID: Matthew Hodge, male   DOB: 1924/11/14, 80 y.o.   MRN: [id number] Met with pt and [name] to discuss team conference goals and progression and home evaluation.  Both looking forward to it and are hopeful It will achieve the goals they have set out.  Aware of discharge date 2/7 and pt is ready to return home.  He misses his wife.  Have made referral To OP Neuro Rehab.  See how home evaluation goes.

## 2013-09-23 NOTE — Progress Notes (Signed)
Physical Therapy Session Note  Patient Details  Name: JUBAL RADEMAKER MRN: [id number] Date of Birth: [birth year]  Today's Date: 09/23/2013 Time: 4166-0630 and 1415-1500 (full time 1330-1500 home evaluation with OT) Time Calculation (min): 47 min and 45 min  Short Term Goals: Week 3:  PT Short Term Goal 1 (Week 3): = LTG which have been downgraded secondary to lack of progress PT Short Term Goal 1 - Progress (Week 3): Progressing toward goal Week 4:  PT Short Term Goal 1 (Week 4): = LTG for D/C on 09/26/13  Skilled Therapeutic Interventions/Progress Updates:   AM session with caregiver present; discussed schedule for pm and home evaluation.  Transported to gym in w/c total A.  Performed gait training in // bars x 10' x 4 reps with focus on full step length bilaterally and foot clearance with use of low obstacles/poles on floor as visual cue for stepping beginning with step to gait sequence progressing to step over step gait sequence with mod A for lateral weight shifting and maintaining forward weight shifting when leading with RLE.  Transitioned to lateral stepping to L and R x 25' each direction with bilat UE support on wall rail with mod A to L and max A to R with manual facilitation and verbal cues for anterior and full L lateral weight shift to allow full RLE advancement and maintain COG over BOS.  Returned to room to rest before lunch.    PM session: Home evaluation with OT, caregiver, wife and daughter present.  Please see Home Visit Checklist sheet in shadow chart for specific doorway, stair measurements and description of floors and rooms.  All doorways negotiable with RW or w/c; focus of home evaluation on caregiver and pt ability to enter/exit home, bed room, bathroom and ambulate safely through the home including two steps to enter back porch and 2 steps from back porch to kitchen.  See OT note for description of bathroom.  Performed home entry from sidewalk with RW and one grab bar on R  side of door frame.  Pt required assistance to lift RW fully into the home but was then able to ascend two stairs with one UE support on RW and one on grab bar and caregiver mod A.  Pt also performed entry from porch > kitchen with bilat UE support on grab bars and mod-max A to ascend 2 stairs leading with RLE.  Pt and caregiver also demonstrated gait through back porch (carpeted), kitchen (linoleum), spare bedroom with hospital bed (linoleum) and bathroom (linoleum) with RW and caregiver mod A with frequent verbal cues for safety/navigation with RW around obstacles and through doorways, for L lateral weight shifting and full RLE step length and clearance.  Pt and caregiver demonstrated safe transfer to/from bed.  Caregiver and pt also demonstrated safe sit <> stand from pt low leather couch on back porch and from transport w/c with added cushion with mod A and caregiver providing pt with appropriate cues for hand placement and sequence.  Caregiver also reports that pt stands at kitchen sink to wash his face and put in his dentures each morning.  When exiting kitchen to back porch it was felt that it would be unsafe to have pt lean down to place RW on floor with caregiver in front.  Attempted to roll pt to edge of step and place feet down on first step and then stand with UE on grab bars and then transition to RW to step down; pt required +2 to  perform safely and became very upset when attempting stating, "I don't like them, they are coming in here and changing everything.  We have lived here and done it the same way our whole lives."  Attempted to reassure pt that recommendations and changes were for his safety but pt continued to demonstrate frustration and anxiety.  Transitioned to door to exit home; pt approached doorway with RW but then became frozen and unable to shift his weight forwards or to the L even with assistance of caregiver.  Removed platform at bottom of stairs and placed RW on sidewalk; pt required  total A to release grab bar and take RW and total A of 2 to bring LE forwards and step down step and pivot to w/c.  Pt appeared very anxious, fearful, and frustrated with increased rigidity and R lateropulsion, even in w/c.  Once pt back on Lucianne Lei he appeared more relaxed but continued to present with R lateropulsion.   Recommendations based on household barriers and pt impairments: Use of transport w/c in the home bedroom<>kitchen, bathroom, standing from transport w/c at kitchen sink, placing w/c just inside bathroom door to remove one turn to enter bathroom, use of transport w/c for meals at table for energy conservation and falls prevention and limiting ambulation to short distances; increase ambulation distance as recommended by outpatient PT.  Recommending use of transport w/c in the home and keep manual w/c in the car for community mobility.  For home entry/exit have +2 assistance available for safety and to assist with placement of RW while second person stabilized pt during stair negotiation.  Installation of rails or ramp at outside entry and entry from back porch sitting area <> kitchen for safer home entry/exit that would not require +2 A each time if pt demonstrates little progress in this area.  Caregiver feels that pt and wife will be resistant to addition of rails or ramp but will continue to discuss with them.    Pt to continue to use hospital bed in the smaller bedroom secondary to inaccessibility of pt/wife bedroom and height of bed.  Pt to transition to his bedroom with wife when cleared by outpatient PT.   Therapy Documentation Precautions:  Precautions Precautions: Fall Restrictions Weight Bearing Restrictions: No Pain: Pain Assessment Pain Assessment: No/denies pain Pain Score: 0-No pain  See FIM for current functional status  Therapy/Group: Individual Therapy and Home evaluation with OT  [name] [facility] 09/23/2013, 11:36 AM

## 2013-09-23 NOTE — Patient Care Conference (Signed)
Inpatient RehabilitationTeam Conference and Plan of Care Update Date: 09/23/2013   Time: 11;05 AM    Patient Name: Matthew Hodge      Medical Record Number: [id number]  Date of Birth: November 19, 1924 Sex: Male         Room/Bed: 4M07C/4M07C-01 Payor Info: Payor: MEDICARE / Plan: MEDICARE PART A AND B / Product Type: *No Product type* /    Admitting Diagnosis: CVA  Admit Date/Time:  09/01/2013  5:10 PM Admission Comments: No comment available   Primary Diagnosis:  <principal problem not specified> Principal Problem: <principal problem not specified>  Patient Active Problem List   Diagnosis Date Noted  . CVA (cerebral infarction) 09/01/2013  . Stroke 08/27/2013  . Alzheimer's disease   . Enlarged prostate with urinary retention 07/15/2012  . Chronic constipation 07/15/2012  . Hyperglycemia 07/15/2012  . Acute renal failure 07/15/2012  . Leukocytosis 07/15/2012  . Normocytic anemia 07/15/2012  . UTI (lower urinary tract infection) 07/15/2012    Expected Discharge Date: Expected Discharge Date: 09/26/13  Team Members Present: Physician leading conference: Dr. Alysia Penna Social Worker Present: Alfonse Alpers, LCSW;Becky Braleigh Massoud, LCSW Nurse Present: Nanine Means, RN PT Present: [name], PT;Emily Rinaldo Cloud, PT OT Present: Simonne Come, OT;Kris Gellert, Maryella Shivers, OT SLP Present: Gunnar Fusi, SLP PPS Coordinator present : Daiva Nakayama, RN, CRRN     Current Status/Progress Goal Weekly Team Focus  Medical   poor cognition  maintain stability  D/C planning   Bowel/Bladder   Continent of bowel; suprapubic cath + UTI- on abx  Remain continent of bowel and maintain current regimen for cath  Educate caregiver on catheter care and continue current bowel regimen   Swallow/Nutrition/ Hydration     Regular diet        ADL's   mod-max assist transfers, mod assist bathing at sit> stand in shower, supervision UB dressing, max assist LB dressing at sit> stand  min-mod assist  overall  focus on functional tasks, transfers, caregiver training   Mobility   Mod-max A overall for transfers, gait with RW, stairs   MIn A w/c mobility, Mod A transfers, max A stairs with 2 rails,  max A gait with RW and possible orthosis  Home evaluation, caregiver training for transfers, car, gait, stairs.   Communication     [facility]        Safety/Cognition/ Behavioral Observations    No safety issues-monitors        Pain   No c/o pain  < 3  Assess and treat pain q shift and prn   Skin   Abraision to right elbow- tegaderm in place- healing  Remain free from skin breakdown or infection  Assess skin q shift and prn      *See Care Plan and progress notes for long and short-term goals.  Barriers to Discharge: 24/7 care    Possible Resolutions to Barriers:  Caregiver training    Discharge Planning/Teaching Needs:  Home with wife and caregiver-Gary learning his care and participating in therapies with pt      Team Discussion:  Home eval today, UTI being treated for.  [name] continues to participate in pt's care in preparation for discharge end of this week.  Revisions to Treatment Plan:  Work toward discharge on Sat   Continued Need for Acute Rehabilitation Level of Care: The patient requires daily medical management by a physician with specialized training in physical medicine and rehabilitation for the following conditions: Daily direction of a multidisciplinary physical rehabilitation program to  ensure safe treatment while eliciting the highest outcome that is of practical value to the patient.: Yes Daily medical management of patient stability for increased activity during participation in an intensive rehabilitation regime.: Yes Daily analysis of laboratory values and/or radiology reports with any subsequent need for medication adjustment of medical intervention for : Neurological problems  Kameryn Tisdel, [name] 09/23/2013, 3:22 PM

## 2013-09-23 NOTE — Progress Notes (Signed)
Subjective/Complaints: 80 y.o. right-handed male was documented history of Alzheimer's disease. Patient lives with his wife and used a cane prior to admission. Patient on no scheduled medications prior to admission. Admitted 08/27/2013 with right side weakness and altered mental status. Cranial CT scan showed no acute abnormalities. Patient did receive TPA. MRI of the brain showed small patchy areas of acute infarct in the left parietal lobe. Possible 5 mm acute infarct right parietal white matter. MRA of the head with no large vessel occlusion. Echocardiogram with ejection fraction of 55% and normal systolic function.   No issues overnite, denies pain, denies breathing problem  Review of Systems - incomplete secondary to cognitive issues  Objective: Vital Signs: Blood pressure 130/71, pulse 75, temperature 97.7 F (36.5 C), temperature source Oral, resp. rate 18, height 6' (1.829 m), weight 81.375 kg (179 lb 6.4 oz), SpO2 98.00%. No results found. Results for orders placed during the hospital encounter of 09/01/13 (from the past 72 hour(s))  URINALYSIS, ROUTINE W REFLEX MICROSCOPIC     Status: Abnormal   Collection Time    09/20/13  4:39 PM      Result Value Range   Color, Urine YELLOW  YELLOW   APPearance CLOUDY (*) CLEAR   Specific Gravity, Urine 1.007  1.005 - 1.030   pH 6.5  5.0 - 8.0   Glucose, UA NEGATIVE  NEGATIVE mg/dL   Hgb urine dipstick SMALL (*) NEGATIVE   Bilirubin Urine NEGATIVE  NEGATIVE   Ketones, ur NEGATIVE  NEGATIVE mg/dL   Protein, ur NEGATIVE  NEGATIVE mg/dL   Urobilinogen, UA 0.2  0.0 - 1.0 mg/dL   Nitrite NEGATIVE  NEGATIVE   Leukocytes, UA LARGE (*) NEGATIVE  URINE CULTURE     Status: None   Collection Time    09/20/13  4:39 PM      Result Value Range   Specimen Description URINE, SUPRAPUBIC     Special Requests NONE     Culture  Setup Time       Value: 09/20/2013 20:55     Performed at [facility]       Value: >=100,000  COLONIES/ML     Performed at [facility]   Culture       Value: PROVIDENCIA RETTGERI     Note: Two isolates with different morphologies were identified as the same organism.The most resistant organism was reported.     Performed at [facility]   Report Status 09/23/2013 FINAL     Organism ID, Bacteria PROVIDENCIA RETTGERI    URINE MICROSCOPIC-ADD ON     Status: None   Collection Time    09/20/13  4:39 PM      Result Value Range   WBC, UA 21-50  <3 WBC/hpf   RBC / HPF 0-2  <3 RBC/hpf   Bacteria, UA RARE  RARE  BASIC METABOLIC PANEL     Status: Abnormal   Collection Time    09/22/13 10:30 AM      Result Value Range   Sodium 136 (*) 137 - 147 mEq/L   Potassium 4.5  3.7 - 5.3 mEq/L   Chloride 97  96 - 112 mEq/L   CO2 25  19 - 32 mEq/L   Glucose, Bld 119 (*) 70 - 99 mg/dL   BUN 21  6 - 23 mg/dL   Creatinine, Ser 1.16  0.50 - 1.35 mg/dL   Calcium 9.0  8.4 - 10.5 mg/dL   GFR calc non  Af Amer 55 (*) >90 mL/min   GFR calc Af Amer 64 (*) >90 mL/min   Comment: (NOTE)     The eGFR has been calculated using the CKD EPI equation.     This calculation has not been validated in all clinical situations.     eGFR's persistently <90 mL/min signify possible Chronic Kidney     Disease.     HEENT: normal Cardio: RRR and no murmurs Resp: CTA B/L and unlabored GI: BS positive and ND, suprapubic cath site intact/clean Extremity:  Pulses positive and No Edema Skin:   Intact, no rash on back Neuro: pleasantly confused, Cranial Nerve II-XII normal, Abnormal Sensory cannot fully assess but grossly intact LT B UE and BLE, Abnormal Motor 4/5 Bilateral delt bi tri grip HF KE ADF and Abnormal FMC Ataxic/ dec FMC Musc/Skel:  Other bilateral shoulder contracture improved flexion, bilateral hamstrings contracture, no pain with  shoulder AROM Gen NAD   Assessment/Plan: 1. Functional deficits secondary to R MCA infarct superimposed on chronic deconditioning which require 3+ hours per day  of interdisciplinary therapy in a comprehensive inpatient rehab setting. Physiatrist is providing close team supervision and 24 hour management of active medical problems listed below. Physiatrist and rehab team continue to assess barriers to discharge/monitor patient progress toward functional and medical goals. Team conference today please see physician documentation under team conference tab, met with team face-to-face to discuss problems,progress, and goals. Formulized individual treatment plan based on medical history, underlying problem and comorbidities. FIM: FIM - Bathing Bathing Steps Patient Completed: Chest;Right Arm;Left Arm;Abdomen;Front perineal area;Right upper leg;Left upper leg Bathing: 3: Mod-Patient completes 5-7 61f10 parts or 50-74%  FIM - Upper Body Dressing/Undressing Upper body dressing/undressing steps patient completed: Thread/unthread right sleeve of pullover shirt/dresss;Thread/unthread left sleeve of pullover shirt/dress;Put head through opening of pull over shirt/dress;Pull shirt over trunk Upper body dressing/undressing: 5: Set-up assist to: Obtain clothing/put away FIM - Lower Body Dressing/Undressing Lower body dressing/undressing steps patient completed: Thread/unthread left underwear leg;Thread/unthread left pants leg;Fasten/unfasten right shoe;Fasten/unfasten left shoe Lower body dressing/undressing: 2: Max-Patient completed 25-49% of tasks  FIM - TMusicianDevices: Grab bar or rail for support Toileting: 1: Two helpers  FIM - TRadio producerDevices: GProduct managerTransfers: 1-Two helpers  FIM - BControl and instrumentation engineerDevices: WYRC WorldwideBed/Chair Transfer: 2: Chair or W/C > Bed: Max A (lift and lower assist);2: Bed > Chair or W/C: Max A (lift and lower assist)  FIM - Locomotion: Wheelchair Distance: 25 Locomotion: Wheelchair: 1: Total Assistance/staff pushes wheelchair  (Pt<25%) FIM - Locomotion: Ambulation Locomotion: Ambulation Assistive Devices: Walker - Rolling;Orthosis Ambulation/Gait Assistance: 3: Mod assist;2: Max assist Locomotion: Ambulation: 2: Travels 50 - 149 ft with maximal assistance (Pt: 25 - 49%)  Comprehension Comprehension Mode: Auditory Comprehension: 4-Understands basic 75 - 89% of the time/requires cueing 10 - 24% of the time  Expression Expression Mode: Verbal Expression: 4-Expresses basic 75 - 89% of the time/requires cueing 10 - 24% of the time. Needs helper to occlude trach/needs to repeat words.  Social Interaction Social Interaction: 5-Interacts appropriately 90% of the time - Needs monitoring or encouragement for participation or interaction.  Problem Solving Problem Solving: 3-Solves basic 50 - 74% of the time/requires cueing 25 - 49% of the time  Memory Memory: 1-Recognizes or recalls less than 25% of the time/requires cueing greater than 75% of the time  Medical Problem List and Plan:  1. Thrombotic Right MCA infarct  2. DVT Prophylaxis/Anticoagulation: SCDs.Monitor  for any signs of DVT.  3. Pain Management: Tylenol as needed  4. 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 Worsening after bilateral parietal infarcts - Bed alarm for safety  - trial of aricept with minimal effect, have increased to 76m with no SE thus far 5. Neuropsych: This patient is not capable of making decisions on his own behalf. 6.  Hx bladder outlet obstruction chronic supr pubic cath, has UTI, start cipro  LOS (Days) 262A FACE TO FACE EVALUATION WAS PERFORMED  Nima Kemppainen E 09/23/2013, 6:58 AM

## 2013-09-23 NOTE — Progress Notes (Addendum)
Occupational Therapy Weekly Progress Note  Patient Details  Name: Matthew Hodge MRN: [id number] Date of Birth: 30-Aug-1924  Today's Date: 09/23/2013 Time: 2683-4196 and 1330-1415 (co-tx with PT 1330-1500) Time Calculation (min): 60 min and 45 min  Patient has met 2 of 3 short term goals.  Pt is making slow but steady progress towards goals.  Pt's progress continues to be limited by his cognition and decreased awareness of deficits as well as retro and Rt lateral pushing and anxiety with movement.  Focus continues to be placed on functional, familiar tasks to increase participation and carryover.  Hands on education has begun with caregiver to assess if he can provide for pt at current physical and cognitive level.  Home eval conducted to further assess mobility, safety, and recommendations prior to d/c home.    Patient continues to demonstrate the following deficits: Rt hemiparesis, impaired perception of midline with pushing to Rt and posterior, impaired postural control, impaired balance, cognitive deficits and anxiety with mobility and therefore will continue to benefit from skilled OT intervention to enhance overall performance with BADL and Reduce care partner burden.  Patient progressing toward long term goals..  Continue plan of care. Downgraded toilet transfer and tub/shower transfer to max assist due to home environment not being w/c accessible and needing to sidestep to access both toilet and shower.  OT Short Term Goals Week 3:  OT Short Term Goal 1 (Week 3): Pt will complete sit <> stand for LB bathing with mod assist OT Short Term Goal 1 - Progress (Week 3): Met OT Short Term Goal 2 (Week 3): Pt will complete LB dressing with max assist of one caregiver OT Short Term Goal 2 - Progress (Week 3): Met OT Short Term Goal 3 (Week 3): Pt will complete sidestepping to Rt with mod assist to simulate stepping out of walk-in shower OT Short Term Goal 3 - Progress (Week 3): Progressing toward  goal Week 4:  OT Short Term Goal 1 (Week 4): STGs = LTGs due to remaining LOS  Skilled Therapeutic Interventions/Progress Updates:    1)Pt seen for ADL retraining with focus on hands on education with caregiver with self-care tasks and transfers and increasing pt participation in functional tasks of bathing and dressing. Pt received up in w/c. Completed sit > stand from w/c with RW with mod assist and ambulated to shower chair with max assist and cues for sidestepping with RLE into shower. Pt continues to require verbal cues with bathing for initiation and sequencing and requiring mod assist to maintain standing while caregiver washed buttocks, increased focus on RLE placement and hand placement on grab bars to increase standing balance. Pt required max assist transfer out of shower secondary to pushing Rt and increased cues to advance RLE when stepping to Lt. LB dressing required max encouragement to attempt threading pant legs and fasten shoes due to insecurity leaning forward, fearfulness, and frustration and pt stating "this is pointless". Mod assist sit > stand at sink with UE support on sink, use of mirror to promote upright standing while pt attempted to pull up brief, requiring additional assist from caregiver to fully pull over hips.   2) Pt seen for skilled co-tx/home evaluation with PT.  Engaged in [place] mobility in home environment up 2 steps into home, and up steps from sitting room to kitchen, and into bedroom.  Pt began to fatigue and become more frustrated as session progressed.  Performed mobility in bathroom which requires 90 degree turn to Lt  and sidestepping to enter bathroom and along length of bathroom.  Caregiver provided physical assist and verbal cues for sidestepping and hand placement to increase safety.  Therapist recommended modifications to technique to further increase safety and recommended placing w/c just inside door to decrease amount of sidestepping and eliminate additional  turn to enter/exit bathroom. Pt must sidestep to Lt to enter bathroom with use of sink and grab bars on either side of sink and then sidestep to Rt to exit bathroom again using sink and grab bars.  As pt fatigued he became increasingly tearful and frustrated.  Pt questioning why all these changes being made.  Attempted to explain and reassure, however due to decreased cognition at baseline complicated by CVA pt unable to rationalize.  Wife and daughter present attempting to calm pt.  Pt manageable by one person in home environment and entering home environment, however recommend +2 for exiting home and consider alternatives such as removing partial step or adding ramp to exit.  See PT note for more info regarding mobility in home and with entry/exit.  Therapy Documentation Precautions:  Precautions Precautions: Fall Restrictions Weight Bearing Restrictions: No Pain: Pain Assessment Pain Assessment: No/denies pain Pain Score: 0-No pain  See FIM for current functional status  Therapy/Group: Individual Therapy and Co-Treatment  Lissette Schenk, [facility] 09/23/2013, 11:17 AM

## 2013-09-23 NOTE — Progress Notes (Signed)
Social Work Elease Hashimoto, LCSW Social Worker Signed  Patient Care Conference Service date: 09/23/2013 3:22 PM  Inpatient RehabilitationTeam Conference and Plan of Care Update Date: 09/23/2013   Time: 11;05 AM     Patient Name: Matthew Hodge       Medical Record Number: [id number]   Date of Birth: 1924/12/28 Sex: Male         Room/Bed: 4M07C/4M07C-01 Payor Info: Payor: MEDICARE / Plan: MEDICARE PART A AND B / Product Type: *No Product type* /   Admitting Diagnosis: CVA   Admit Date/Time:  09/01/2013  5:10 PM Admission Comments: No comment available   Primary Diagnosis:  <principal problem not specified> Principal Problem: <principal problem not specified>    Patient Active Problem List     Diagnosis  Date Noted   .  CVA (cerebral infarction)  09/01/2013   .  Stroke  08/27/2013   .  Alzheimer's disease     .  Enlarged prostate with urinary retention  07/15/2012   .  Chronic constipation  07/15/2012   .  Hyperglycemia  07/15/2012   .  Acute renal failure  07/15/2012   .  Leukocytosis  07/15/2012   .  Normocytic anemia  07/15/2012   .  UTI (lower urinary tract infection)  07/15/2012     Expected Discharge Date: Expected Discharge Date: 09/26/13  Team Members Present: Physician leading conference: Dr. Alysia Penna Social Worker Present: Alfonse Alpers, LCSW;Becky Travion Ke, LCSW Nurse Present: Nanine Means, RN PT Present: [name], PT;Emily Rinaldo Cloud, PT OT Present: Simonne Come, OT;Kris Gellert, Maryella Shivers, OT SLP Present: Gunnar Fusi, SLP PPS Coordinator present : Daiva Nakayama, RN, CRRN        Current Status/Progress  Goal  Weekly Team Focus   Medical     poor cognition  maintain stability  D/C planning   Bowel/Bladder     Continent of bowel; suprapubic cath + UTI- on abx  Remain continent of bowel and maintain current regimen for cath  Educate caregiver on catheter care and continue current bowel regimen   Swallow/Nutrition/ Hydration     Regular diet        ADL's     mod-max assist transfers, mod assist bathing at sit> stand in shower, supervision UB dressing, max assist LB dressing at sit> stand  min-mod assist overall  focus on functional tasks, transfers, caregiver training   Mobility     Mod-max A overall for transfers, gait with RW, stairs   MIn A w/c mobility, Mod A transfers, max A stairs with 2 rails,  max A gait with RW and possible orthosis  Home evaluation, caregiver training for transfers, car, gait, stairs.   Communication     [facility]       Safety/Cognition/ Behavioral Observations    No safety issues-monitors       Pain     No c/o pain  < 3  Assess and treat pain q shift and prn   Skin     Abraision to right elbow- tegaderm in place- healing  Remain free from skin breakdown or infection  Assess skin q shift and prn     *See Care Plan and progress notes for long and short-term goals.    Barriers to Discharge:  24/7 care      Possible Resolutions to Barriers:    Caregiver training      Discharge Planning/Teaching Needs:    Home with wife and caregiver-Gary learning his care and participating in therapies with  pt      Team Discussion:    Home eval today, UTI being treated for.  [name] continues to participate in pt's care in preparation for discharge end of this week.   Revisions to Treatment Plan:    Work toward discharge on Sat    Continued Need for Acute Rehabilitation Level of Care: The patient requires daily medical management by a physician with specialized training in physical medicine and rehabilitation for the following conditions: Daily direction of a multidisciplinary physical rehabilitation program to ensure safe treatment while eliciting the highest outcome that is of practical value to the patient.: Yes Daily medical management of patient stability for increased activity during participation in an intensive rehabilitation regime.: Yes Daily analysis of laboratory values and/or radiology reports with any  subsequent need for medication adjustment of medical intervention for : Neurological problems  Naeemah Jasmer, [name] 09/23/2013, 3:22 PM         Elease Hashimoto, LCSW Social Worker Signed  Patient Care Conference Service date: 09/16/2013 2:38 PM  Inpatient RehabilitationTeam Conference and Plan of Care Update Date: 09/16/2013   Time: 11:00 AM     Patient Name: Matthew Hodge       Medical Record Number: [id number]   Date of Birth: July 16, 1925 Sex: Male         Room/Bed: 4W12C/4W12C-01 Payor Info: Payor: MEDICARE / Plan: MEDICARE PART A AND B / Product Type: *No Product type* /   Admitting Diagnosis: CVA   Admit Date/Time:  09/01/2013  5:10 PM Admission Comments: No comment available   Primary Diagnosis:  <principal problem not specified> Principal Problem: <principal problem not specified>    Patient Active Problem List     Diagnosis  Date Noted   .  CVA (cerebral infarction)  09/01/2013   .  Stroke  08/27/2013   .  Alzheimer's disease     .  Enlarged prostate with urinary retention  07/15/2012   .  Chronic constipation  07/15/2012   .  Hyperglycemia  07/15/2012   .  Acute renal failure  07/15/2012   .  Leukocytosis  07/15/2012   .  Normocytic anemia  07/15/2012   .  UTI (lower urinary tract infection)  07/15/2012     Expected Discharge Date: Expected Discharge Date: 09/26/13  Team Members Present: Physician leading conference: Dr. Alysia Penna Social Worker Present: Ovidio Kin, LCSW;Jenny Prevatt, LCSW Nurse Present: Dorien Chihuahua, RN PT Present: [name], PT;Emily Rinaldo Cloud, PT OT Present: Simonne Come, OT;Kris Gellert, Maryella Shivers, OT SLP Present: Gunnar Fusi, SLP PPS Coordinator present : Daiva Nakayama, RN, CRRN        Current Status/Progress  Goal  Weekly Team Focus   Medical     poor cognition minimal improvement on aricept  Maintain stability  D/C planning   Bowel/Bladder     suprapubic cath and continent of bowel  remain continent of bowel and manage  suprapubic cath  educate care giver on catheter maintainance and medication adminstration    Swallow/Nutrition/ Hydration     Regular diet       ADL's     max assist transfers, mod assist bathing at sit > stand in shower, min-supervision UB dressing, +2 for LB dressing at sit > stand level  min-mod assist overall  focus on functional tasks, self-care retraining, sitting balance, transfers   Mobility     Mod-max A overall for transfers, stairs but requires +2 and lift equipment for gait  MIn A w/c mobility, Mod A  transfers, max A stairs with 2 rails,  max A gait with therapy only  transfer training with caregiver, car transfers, stairs, gait   Communication     Min-Mod assist for initiation   Min assist   increase initiation of wants and needs with choice of two   Safety/Cognition/ Behavioral Observations    Max for orientation and basic tasks  downgraded to mod assist   increase initiation, cessation and ability to move to next step of task   Pain     n/a  n./a  n/a   Skin     bruising bil UE  maintain skin integrity  assess skin and educate on safe transfers and skin care     *See Care Plan and progress notes for long and short-term goals.    Barriers to Discharge:  requires 24/7 care, severe dementia      Possible Resolutions to Barriers:    Increased caregiver hours vs SNF      Discharge Planning/Teaching Needs:    Hom with wife and [name] assisting, [name] is learning pt's care and particiapting.  Ques if can move up discharge date      Team Discussion:    Have begun training with Gary-caregiver-repetitions-small gains.  See how family training goes and may be able to move up discharge date.   Revisions to Treatment Plan: D/C speech since no progress due to lack of progress due to dementia    Continued Need for Acute Rehabilitation Level of Care: The patient requires daily medical management by a physician with specialized training in physical medicine and rehabilitation for the  following conditions: Daily direction of a multidisciplinary physical rehabilitation program to ensure safe treatment while eliciting the highest outcome that is of practical value to the patient.: Yes Daily medical management of patient stability for increased activity during participation in an intensive rehabilitation regime.: Yes  Elease Hashimoto 09/17/2013, 9:26 AM          Patient ID: [name], male   DOB: [birth year], 80 y.o.   MRN: [id number]

## 2013-09-24 ENCOUNTER — Inpatient Hospital Stay (HOSPITAL_COMMUNITY): Payer: Medicare Other | Admitting: Occupational Therapy

## 2013-09-24 ENCOUNTER — Inpatient Hospital Stay (HOSPITAL_COMMUNITY): Payer: Medicare Other | Admitting: Physical Therapy

## 2013-09-24 ENCOUNTER — Inpatient Hospital Stay (HOSPITAL_COMMUNITY): Payer: Medicare Other

## 2013-09-24 NOTE — Progress Notes (Signed)
Occupational Therapy Session Note  Patient Details  Name: Matthew Hodge MRN: [id number] Date of Birth: 1924-11-28  Today's Date: 09/24/2013 Time: 0920-1015 and [id number] Time Calculation (min): 55 min and 30 min  Short Term Goals: Week 4:  OT Short Term Goal 1 (Week 4): STGs = LTGs due to remaining LOS  Skilled Therapeutic Interventions/Progress Updates:    1) Pt seen for hands on education with caregiver with bathroom transfers to simulate home setup.  Due to narrow space in bathroom, pt will complete stand pivot from toilet to tub bench in walk-in shower (with tub bench over ledge to eliminate need to step over 6" ledge).  Engaged in tub transfer x2 with caregiver providing physical assist and verbal cues.  Pt continues to demonstrate Rt lateral lean with mobility, requiring increased cues for stepping with RLE and weight shifting to Lt to promote increased midline posture.  Pt with increased carryover with transfer to Lt into shower, but required increased assist when transferring back to Rt.  Pt's caregiver reports he feels comfortable providing amount of physical assist needed for bathroom transfers.  2) Engaged in standing task with focus on midline standing while incorporating RUE into table top task.  Initially provided tactile cues at Rt knee to promote knee extension to improve balance progressing to decreased tactile cues and relying on verbal cues.  Pt with no carryover or sensation of knee flexion, therefore continues to require tactile and/or verbal cues for increased knee extension in standing.  Pt tolerated standing 5 mins x3 with min-mod assist to maintain static standing balance with reaching with RUE.    Therapy Documentation Precautions:  Precautions Precautions: Fall Restrictions Weight Bearing Restrictions: No Pain: Pain Assessment Pain Assessment: No/denies pain  See FIM for current functional status  Therapy/Group: Individual Therapy  Simonne Come 09/24/2013,  12:25 PM

## 2013-09-24 NOTE — Progress Notes (Signed)
Physical Therapy Note  Patient Details  Name: Matthew Hodge MRN: [id number] Date of Birth: [birth year] Today's Date: 09/24/2013  8:15 - 9:10 55 minutes Individual session Patient denies pain.  Patient sitting in wheelchair eating breakfast upon entering room. Therapist left and gave patient 15 minutes to finish breakfast. Patient pushed to gym in wheelchair. Therapy focused on simulating home set up with 2 steps and vertical grab bar. Patient max assist sit to stand. Patient with posterior lean and pushing to right. Patient up 1 step with max assist and froze reporting that he had to go to bathroom. Patient assisted down step backwards and back to sitting in wheelchair. Patient pushed to bathroom. [name], patient's caregiver, assisted patient with stand pivot transfer wheelchair <> toilet. Patient required +2 assist to help get pants up, down, and hygiene. Patient sat at sink and washed hands independently. Patient ambulated up and down 2 steps using railing in hallway and walker at top of steps with +1 max assist. Patient ambulated 10 feet on carpeted surfaces with mod assist from wheelchair to regular double bed to simulate home environment. Patient requires cueing and facilitation to keep right LE more abducted and decrease pushing/lean to right. Patient mod to max assist sit <> stand from regular double bed. Patient doing much better leaning forward and pushing up. Assistance needed to decrease pushing to right. Patient left in wheelchair in room with items in reach and [place] in room.   Elder Love M 09/24/2013, 10:29 AM

## 2013-09-24 NOTE — Progress Notes (Signed)
Social Work Patient ID: Matthew Hodge, male   DOB: 12-11-1924, 80 y.o.   MRN: [id number] Pt requires a lightweight wheelchair to be able to self propel and uses for self care tasks.  He is unable to self propel a standard weight wheelchair. Home eval went well according to pt and [name].  Work toward discharge Sat.

## 2013-09-24 NOTE — Progress Notes (Signed)
Physical Therapy Session Note  Patient Details  Name: Matthew Hodge MRN: [id number] Date of Birth: 1925/04/25  Today's Date: 09/24/2013 Time: 1113-1200 Time Calculation (min): 47 min  Short Term Goals: Week 3:  PT Short Term Goal 1 (Week 3): = LTG which have been downgraded secondary to lack of progress PT Short Term Goal 1 - Progress (Week 3): Progressing toward goal Week 4:  PT Short Term Goal 1 (Week 4): = LTG for D/C on 09/26/13  Skilled Therapeutic Interventions/Progress Updates:   Discussed home evaluation with pt and caregiver and reinforced recommendations of gradually increasing gait in home as tolerated and use of transport w/c and manual w/c when needed for safety and energy conservation.  Discussed with caregiver option of bumping w/c up/down one step (remove platform at back door) for entry/exit when pt is fatigued and can't negotiate stairs safely. Demonstrated full w/c bumping sequence to caregiver (forwards to ascend, backwards to descend) and had caregiver return demonstrate with min verbal cues.    Continued gait assessment with R blue rocker AFO donned and RW x 30' with caregiver providing mod A on R side and verbal cues for weight shifting and R foot clearance/step length.  Pt noted to have more consistent foot clearance and RLE extension in stance with AFO donned.  Caregiver requested to order AFO for home mobility.  PA notified for order.    Performed NMR in standing in // bars with bilat UE support on bars during RLE step ups on 2" step and retro stepping off of step to focus on anterior and lateral weight shifting, motor control and activation of RLE extensors in stance and hip flexion/extension activation during clearance.  Performed x 5-6 reps with mod A and total verbal cues for sequencing; extra time needed after cues to initiate weight shift or RLE advancement.  Performed retro stepping to return to w/c.  Returned to room for lunch.  Therapy  Documentation Precautions:  Precautions Precautions: Fall Restrictions Weight Bearing Restrictions: No Pain: Pain Assessment Pain Assessment: No/denies pain  See FIM for current functional status  Therapy/Group: Individual Therapy  [name] [facility] 09/24/2013, 12:38 PM

## 2013-09-24 NOTE — Discharge Instructions (Signed)
Inpatient Rehab Discharge Instructions  Matthew Hodge Discharge date and time: No discharge date for patient encounter.   Activities/Precautions/ Functional Status: Activity: activity as tolerated Diet: regular diet Wound Care: none needed Functional status:  ___ No restrictions     ___ Walk up steps independently _x__ 24/7 supervision/assistance   ___ Walk up steps with assistance ___ Intermittent supervision/assistance  ___ Bathe/dress independently ___ Walk with walker     ___ Bathe/dress with assistance ___ Walk Independently    ___ Shower independently ___ Walk with assistance    ___ Shower with assistance ___ No alcohol     ___ Return to work/school ________  Special Instructions:     COMMUNITY REFERRALS UPON DISCHARGE:    Outpatient: PT  Agency:[facility] [id number]:[phone] Date of Last Service:09/25/2013  Appointment Date/Time:FEBRUARY 11 8;00-8;45 AM  Medical Equipment/Items Ordered:WHEELCHAIR  Agency/Supplier:[facility]    [phone]   GENERAL COMMUNITY RESOURCES FOR PATIENT/FAMILY: Support Groups:[facility] SUPPORT & [facility]ARE[facility] SUPPORT GROUP  My questions have been answered and I understand these instructions. I will adhere to these goals and the provided educational materials after my discharge from the hospital.  Patient/Caregiver Signature _______________________________ Date __________  Clinician Signature _______________________________________ Date __________  Please bring this form and your medication list with you to all your follow-up doctor's appointments.  STROKE/TIA DISCHARGE INSTRUCTIONS SMOKING Cigarette smoking nearly doubles your risk of having a stroke & is the single most alterable risk factor  If you smoke or have smoked in the last 12 months, you are advised to quit smoking for your health.  Most of the excess cardiovascular risk related to smoking disappears within a year of stopping.  Ask you doctor about anti-smoking  medications  [facility] Quit Line: 1-800-QUIT NOW  Free Smoking Cessation Classes [phone]  CHOLESTEROL Know your levels; limit fat & cholesterol in your diet  Lipid Panel     Component Value Date/Time   CHOL 181 08/28/2013 0735   TRIG 72 08/28/2013 0735   HDL 41 08/28/2013 0735   CHOLHDL 4.4 08/28/2013 0735   VLDL 14 08/28/2013 0735   LDLCALC 126* 08/28/2013 0735      Many patients benefit from treatment even if their cholesterol is at goal.  Goal: Total Cholesterol (CHOL) less than 160  Goal:  Triglycerides (TRIG) less than 150  Goal:  HDL greater than 40  Goal:  LDL (LDLCALC) less than 100   BLOOD PRESSURE American Stroke Association blood pressure target is less that 120/80 mm/Hg  Your discharge blood pressure is:  BP: 114/69 mmHg  Monitor your blood pressure  Limit your salt and alcohol intake  Many individuals will require more than one medication for high blood pressure  DIABETES (A1c is a blood sugar average for last 3 months) Goal HGBA1c is under 7% (HBGA1c is blood sugar average for last 3 months)  Diabetes: No known diagnosis of diabetes    Lab Results  Component Value Date   HGBA1C 6.0* 08/28/2013     Your HGBA1c can be lowered with medications, healthy diet, and exercise.  Check your blood sugar as directed by your physician  Call your physician if you experience unexplained or low blood sugars.  PHYSICAL ACTIVITY/REHABILITATION Goal is 30 minutes at least 4 days per week  Activity: Walk with assistance, Therapies: Physical Therapy: Home Health Return to work:   Activity decreases your risk of heart attack and stroke and makes your heart stronger.  It helps control your weight and blood pressure; helps you  relax and can improve your mood.  Participate in a regular exercise program.  Talk with your doctor about the best form of exercise for you (dancing, walking, swimming, cycling).  DIET/WEIGHT Goal is to maintain a healthy weight  Your discharge diet is:  Cardiac  liquids Your height is:  Height: 6' (182.9 cm) Your current weight is: Weight: 81.194 kg (179 lb) Your Body Mass Index (BMI) is:  BMI (Calculated): 24.3  Following the type of diet specifically designed for you will help prevent another stroke.  Your goal weight range is:    Your goal Body Mass Index (BMI) is 19-24.  Healthy food habits can help reduce 3 risk factors for stroke:  High cholesterol, hypertension, and excess weight.  RESOURCES Stroke/Support Group:  Call [phone]   STROKE EDUCATION PROVIDED/REVIEWED AND GIVEN TO PATIENT Stroke warning signs and symptoms How to activate emergency medical system (call 911). Medications prescribed at discharge. Need for follow-up after discharge. Personal risk factors for stroke. Pneumonia vaccine given: No Flu vaccine given: No My questions have been answered, the writing is legible, and I understand these instructions.  I will adhere to these goals & educational materials that have been provided to me after my discharge from the hospital.

## 2013-09-24 NOTE — Progress Notes (Signed)
Subjective/Complaints: 80 y.o. right-handed male was documented history of Alzheimer's disease. Patient lives with his wife and used a cane prior to admission. Patient on no scheduled medications prior to admission. Admitted 08/27/2013 with right side weakness and altered mental status. Cranial CT scan showed no acute abnormalities. Patient did receive TPA. MRI of the brain showed small patchy areas of acute infarct in the left parietal lobe. Possible 5 mm acute infarct right parietal white matter. MRA of the head with no large vessel occlusion. Echocardiogram with ejection fraction of 55% and normal systolic function.   Does remember me, oriented to [facility], getting ready for PT  Review of Systems - incomplete secondary to cognitive issues  Objective: Vital Signs: Blood pressure 136/82, pulse 82, temperature 97.7 F (36.5 C), temperature source Oral, resp. rate 18, height 6' (1.829 m), weight 81.375 kg (179 lb 6.4 oz), SpO2 100.00%. No results found. Results for orders placed during the hospital encounter of 09/01/13 (from the past 72 hour(s))  BASIC METABOLIC PANEL     Status: Abnormal   Collection Time    09/22/13 10:30 AM      Result Value Range   Sodium 136 (*) 137 - 147 mEq/L   Potassium 4.5  3.7 - 5.3 mEq/L   Chloride 97  96 - 112 mEq/L   CO2 25  19 - 32 mEq/L   Glucose, Bld 119 (*) 70 - 99 mg/dL   BUN 21  6 - 23 mg/dL   Creatinine, Ser 1.16  0.50 - 1.35 mg/dL   Calcium 9.0  8.4 - 10.5 mg/dL   GFR calc non Af Amer 55 (*) >90 mL/min   GFR calc Af Amer 64 (*) >90 mL/min   Comment: (NOTE)     The eGFR has been calculated using the CKD EPI equation.     This calculation has not been validated in all clinical situations.     eGFR's persistently <90 mL/min signify possible Chronic Kidney     Disease.     HEENT: normal Cardio: RRR and no murmurs Resp: CTA B/L and unlabored GI: BS positive and ND, suprapubic cath site intact/clean Extremity:  Pulses positive and No Edema Skin:    Intact, no rash on back Neuro: pleasantly confused, Cranial Nerve II-XII normal, Abnormal Sensory cannot fully assess but grossly intact LT B UE and BLE, Abnormal Motor 5-/5 Bilateral delt bi tri grip HF KE ADF and Abnormal FMC Ataxic/ dec FMC Musc/Skel:  Other bilateral shoulder contracture improved flexion, bilateral hamstrings contracture, no pain with  shoulder AROM Gen NAD   Assessment/Plan: 1. Functional deficits secondary to R MCA infarct superimposed on chronic deconditioning which require 3+ hours per day of interdisciplinary therapy in a comprehensive inpatient rehab setting. Physiatrist is providing close team supervision and 24 hour management of active medical problems listed below. Physiatrist and rehab team continue to assess barriers to discharge/monitor patient progress toward functional and medical goals. Plan D/C on 2/7 Outpt NeuroRehab F/u Discussed with caregiver FIM: FIM - Bathing Bathing Steps Patient Completed: Chest;Right Arm;Left Arm;Abdomen;Front perineal area;Right upper leg;Left upper leg Bathing: 3: Mod-Patient completes 5-7 88f10 parts or 50-74%  FIM - Upper Body Dressing/Undressing Upper body dressing/undressing steps patient completed: Thread/unthread right sleeve of pullover shirt/dresss;Thread/unthread left sleeve of pullover shirt/dress;Put head through opening of pull over shirt/dress;Pull shirt over trunk Upper body dressing/undressing: 5: Set-up assist to: Obtain clothing/put away FIM - Lower Body Dressing/Undressing Lower body dressing/undressing steps patient completed: Thread/unthread left underwear leg;Thread/unthread right pants leg;Thread/unthread left  pants leg;Don/Doff left shoe;Fasten/unfasten right shoe;Fasten/unfasten left shoe Lower body dressing/undressing: 3: Mod-Patient completed 50-74% of tasks  FIM - Musician Devices: Grab bar or rail for support Toileting: 1: Two helpers  FIM - Sport and exercise psychologist Devices: Haematologist Transfers: 2-To toilet/BSC: Max A (lift and lower assist);2-From toilet/BSC: Max A (lift and lower assist)  FIM - Control and instrumentation engineer Devices: Walker;Orthosis Bed/Chair Transfer: 2: Chair or W/C > Bed: Max A (lift and lower assist);2: Bed > Chair or W/C: Max A (lift and lower assist)  FIM - Locomotion: Wheelchair Distance: 25 Locomotion: Wheelchair: 1: Total Assistance/staff pushes wheelchair (Pt<25%) FIM - Locomotion: Ambulation Locomotion: Ambulation Assistive Devices: Walker - Rolling;Orthosis Ambulation/Gait Assistance: 3: Mod assist;2: Max assist Locomotion: Ambulation: 2: Travels 50 - 149 ft with maximal assistance (Pt: 25 - 49%)  Comprehension Comprehension Mode: Auditory Comprehension: 4-Understands basic 75 - 89% of the time/requires cueing 10 - 24% of the time  Expression Expression Mode: Verbal Expression: 4-Expresses basic 75 - 89% of the time/requires cueing 10 - 24% of the time. Needs helper to occlude trach/needs to repeat words.  Social Interaction Social Interaction: 5-Interacts appropriately 90% of the time - Needs monitoring or encouragement for participation or interaction.  Problem Solving Problem Solving: 3-Solves basic 50 - 74% of the time/requires cueing 25 - 49% of the time  Memory Memory: 1-Recognizes or recalls less than 25% of the time/requires cueing greater than 75% of the time  Medical Problem List and Plan:  1. Thrombotic Right MCA infarct  2. DVT Prophylaxis/Anticoagulation: SCDs.Monitor for any signs of DVT.  3. Pain Management: Tylenol as needed  4. 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 Worsening after bilateral parietal infarcts - Bed alarm for safety  - trial of aricept with minimal effect, have increased to 11m with no SE thus far- neuro f/u as outpt 5. Neuropsych: This patient is not capable of making decisions on his own behalf. 6.   Hx bladder outlet obstruction chronic supr pubic cath,change at urology office as outpt LOS (Days) 23 A FACE TO FACE EVALUATION WAS PERFORMED  Alaila Pillard E 09/24/2013, 8:25 AM

## 2013-09-24 NOTE — Progress Notes (Signed)
Orthopedic Tech Progress Note Patient Details:  Matthew Hodge 1925/06/23 [id number] [facility] called to ensure they received brace order; spoke with [name] who said they had already been contacted.  Patient ID: Matthew Hodge, male   DOB: 1925-08-09, 80 y.o.   MRN: [id number]   [name] 09/24/2013, 3:34 PM

## 2013-09-25 ENCOUNTER — Inpatient Hospital Stay (HOSPITAL_COMMUNITY): Payer: Medicare Other | Admitting: Occupational Therapy

## 2013-09-25 ENCOUNTER — Inpatient Hospital Stay (HOSPITAL_COMMUNITY): Payer: Medicare Other | Admitting: Physical Therapy

## 2013-09-25 DIAGNOSIS — I6350 Cerebral infarction due to unspecified occlusion or stenosis of unspecified cerebral artery: Secondary | ICD-10-CM

## 2013-09-25 DIAGNOSIS — R339 Retention of urine, unspecified: Secondary | ICD-10-CM

## 2013-09-25 DIAGNOSIS — F0390 Unspecified dementia without behavioral disturbance: Secondary | ICD-10-CM

## 2013-09-25 MED ORDER — CIPROFLOXACIN HCL 250 MG PO TABS: 250.0000 mg | ORAL_TABLET | Freq: Two times a day (BID) | ORAL | Status: DC

## 2013-09-25 MED ORDER — ASPIRIN 325 MG PO TABS: 325.0000 mg | ORAL_TABLET | Freq: Every day | ORAL | Status: DC

## 2013-09-25 MED ORDER — SENNOSIDES-DOCUSATE SODIUM 8.6-50 MG PO TABS: 2.0000 | ORAL_TABLET | Freq: Every day | ORAL | Status: DC

## 2013-09-25 MED ORDER — DONEPEZIL HCL 5 MG PO TABS: 5.0000 mg | ORAL_TABLET | Freq: Every day | ORAL | Status: DC

## 2013-09-25 MED ORDER — SIMVASTATIN 20 MG PO TABS: 20.0000 mg | ORAL_TABLET | Freq: Every day | ORAL | Status: DC

## 2013-09-25 MED ORDER — PANTOPRAZOLE SODIUM 40 MG PO TBEC: 40.0000 mg | DELAYED_RELEASE_TABLET | Freq: Every day | ORAL | Status: DC

## 2013-09-25 NOTE — Progress Notes (Signed)
Occupational Therapy Session Note  Patient Details  Name: Matthew Hodge MRN: [id number] Date of Birth: [birth year]  Today's Date: 09/25/2013 Time: 3818-2993 Time Calculation (min): 34 min  Skilled Therapeutic Interventions/Progress Updates:    Pt taken to the gym to work on standing endurance and balance while performing functional task.  Pt stood with use of the RW while releasing UEs to work on pipe tree puzzle.  Therapist had placed pieces needed for each puzzle out on the table.  With initial sit to stand pt was able to perform with min assist.  The longer he stands the more he losses his balance posterior and to the right.  Pt's right knee buckles slightly as pt exhibits motor impersistence.  He needs max assist to keep from falling and to correct his balance.  Performed 5 interval of sit to stand.  Intervals ranging from 45 seconds to 2 mins before needing to sit down secondary to either fatigue or severe posterior lean.  Pt needing max instructional cueing for sequencing hand placement with sit to stand and stand to sit.  Pt's caregiver Matthew Hodge also present for session as well.     Therapy Documentation Precautions:  Precautions Precautions: Fall Restrictions Weight Bearing Restrictions: No  Pain: Pain Assessment Pain Assessment: No/denies pain ADL: See FIM for current functional status  Therapy/Group: Individual Therapy  Maeleigh Buschman OTR/L 09/25/2013, 4:00 PM

## 2013-09-25 NOTE — Progress Notes (Signed)
Subjective/Complaints: 80 y.o. right-handed male was documented history of Alzheimer's disease. Patient lives with his wife and used a cane prior to admission. Patient on no scheduled medications prior to admission. Admitted 08/27/2013 with right side weakness and altered mental status. Cranial CT scan showed no acute abnormalities. Patient did receive TPA. MRI of the brain showed small patchy areas of acute infarct in the left parietal lobe. Possible 5 mm acute infarct right parietal white matter. MRA of the head with no large vessel occlusion. Echocardiogram with ejection fraction of 55% and normal systolic function.   No complaints. He's aware that he's going home tomorrow  Review of Systems - incomplete secondary to cognitive issues  Objective: Vital Signs: Blood pressure 128/71, pulse 70, temperature 97.6 F (36.4 C), temperature source Oral, resp. rate 19, height 6' (1.829 m), weight 81.375 kg (179 lb 6.4 oz), SpO2 99.00%. No results found. Results for orders placed during the hospital encounter of 09/01/13 (from the past 72 hour(s))  BASIC METABOLIC PANEL     Status: Abnormal   Collection Time    09/22/13 10:30 AM      Result Value Range   Sodium 136 (*) 137 - 147 mEq/L   Potassium 4.5  3.7 - 5.3 mEq/L   Chloride 97  96 - 112 mEq/L   CO2 25  19 - 32 mEq/L   Glucose, Bld 119 (*) 70 - 99 mg/dL   BUN 21  6 - 23 mg/dL   Creatinine, Ser 1.16  0.50 - 1.35 mg/dL   Calcium 9.0  8.4 - 10.5 mg/dL   GFR calc non Af Amer 55 (*) >90 mL/min   GFR calc Af Amer 64 (*) >90 mL/min   Comment: (NOTE)     The eGFR has been calculated using the CKD EPI equation.     This calculation has not been validated in all clinical situations.     eGFR's persistently <90 mL/min signify possible Chronic Kidney     Disease.     HEENT: normal Cardio: RRR and no murmurs Resp: CTA B/L and unlabored GI: BS positive and ND, suprapubic cath site intact/clean Extremity:  Pulses positive and No Edema Skin:    Intact, no rash on back Neuro: pleasantly confused, Cranial Nerve II-XII normal, Abnormal Sensory cannot fully assess but grossly intact LT B UE and BLE, Abnormal Motor 5-/5 Bilateral delt bi tri grip HF KE ADF and Abnormal FMC Ataxic/ dec FMC Musc/Skel:  Other bilateral shoulder contracture improved flexion, bilateral hamstrings contracture, no pain with  shoulder AROM Gen NAD   Assessment/Plan: 1. Functional deficits secondary to R MCA infarct superimposed on chronic deconditioning which require 3+ hours per day of interdisciplinary therapy in a comprehensive inpatient rehab setting. Physiatrist is providing close team supervision and 24 hour management of active medical problems listed below. Physiatrist and rehab team continue to assess barriers to discharge/monitor patient progress toward functional and medical goals.  Home tomorrow  FIM: FIM - Bathing Bathing Steps Patient Completed: Chest;Right Arm;Left Arm;Abdomen;Front perineal area;Right upper leg;Left upper leg Bathing: 3: Mod-Patient completes 5-7 76f10 parts or 50-74%  FIM - Upper Body Dressing/Undressing Upper body dressing/undressing steps patient completed: Thread/unthread right sleeve of pullover shirt/dresss;Thread/unthread left sleeve of pullover shirt/dress;Put head through opening of pull over shirt/dress;Pull shirt over trunk Upper body dressing/undressing: 5: Set-up assist to: Obtain clothing/put away FIM - Lower Body Dressing/Undressing Lower body dressing/undressing steps patient completed: Thread/unthread left underwear leg;Thread/unthread right pants leg;Thread/unthread left pants leg;Don/Doff left shoe;Fasten/unfasten right shoe;Fasten/unfasten left  shoe Lower body dressing/undressing: 3: Mod-Patient completed 50-74% of tasks  FIM - Musician Devices: Grab bar or rail for support Toileting: 1: Two helpers  FIM - Radio producer Devices: Elevated toilet  seat Toilet Transfers: 2-From toilet/BSC: Max A (lift and lower assist);2-To toilet/BSC: Max A (lift and lower assist)  FIM - Control and instrumentation engineer Devices: Walker;Orthosis Bed/Chair Transfer: 2: Chair or W/C > Bed: Max A (lift and lower assist);2: Bed > Chair or W/C: Max A (lift and lower assist)  FIM - Locomotion: Wheelchair Distance: 25 Locomotion: Wheelchair: 1: Total Assistance/staff pushes wheelchair (Pt<25%) FIM - Locomotion: Ambulation Locomotion: Ambulation Assistive Devices: Walker - Rolling;Orthosis Ambulation/Gait Assistance: 3: Mod assist;2: Max assist Locomotion: Ambulation: 2: Travels 50 - 149 ft with maximal assistance (Pt: 25 - 49%)  Comprehension Comprehension Mode: Auditory Comprehension: 4-Understands basic 75 - 89% of the time/requires cueing 10 - 24% of the time  Expression Expression Mode: Verbal Expression: 4-Expresses basic 75 - 89% of the time/requires cueing 10 - 24% of the time. Needs helper to occlude trach/needs to repeat words.  Social Interaction Social Interaction: 5-Interacts appropriately 90% of the time - Needs monitoring or encouragement for participation or interaction.  Problem Solving Problem Solving: 3-Solves basic 50 - 74% of the time/requires cueing 25 - 49% of the time  Memory Memory: 1-Recognizes or recalls less than 25% of the time/requires cueing greater than 75% of the time  Medical Problem List and Plan:  1. Thrombotic Right MCA infarct  2. DVT Prophylaxis/Anticoagulation: SCDs.Monitor for any signs of DVT.  3. Pain Management: Tylenol as needed  4. 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 Worsening after bilateral parietal infarcts - Bed alarm for safety  - trial of aricept  54m with no SE thus far- neuro f/u as outpt 5. Neuropsych: This patient is not capable of making decisions on his own behalf. 6.  Hx bladder outlet obstruction chronic supr pubic cath,change at urology office as  outpt LOS (Days) 24 A FACE TO FACE EVALUATION WAS PERFORMED  Khelani Kops T 09/25/2013, 8:39 AM

## 2013-09-25 NOTE — Progress Notes (Signed)
Social Work Discharge Note Discharge Note  The overall goal for the admission was met for:   Discharge location: Yes-HOME WITH WIFE AND GARY-CAREGIVER-24 HR CARE  Length of Stay: Yes-25 DAYS  Discharge activity level: Yes-MIN/MOD LEVEL  Home/community participation: Yes  Services provided included: MD, RD, PT, OT, SLP, RN, TR, Pharmacy and SW  Financial Services: Medicare and Private Insurance: [employer]  Follow-up services arranged: Outpatient: [facility]- 2/11 8;00-8;45 AM  and DME: [facility]  Comments (or additional information):[facility] COMPLETED WITH GARY AND HOME EVAL THIS WEEK.  ALL FEEL PREPARED FOR DISHCARGE  Patient/Family verbalized understanding of follow-up arrangements: Yes  Individual responsible for coordination of the follow-up plan: [place]  Confirmed correct DME delivered: Elease Hashimoto 09/25/2013    Elease Hashimoto

## 2013-09-25 NOTE — Progress Notes (Signed)
Occupational Therapy Session Note  Patient Details  Name: Matthew Hodge MRN: [id number] Date of Birth: 21-Dec-1924  Today's Date: 09/25/2013 Time: 0910-1015 Time Calculation (min): 65 min  Short Term Goals: Week 4:  OT Short Term Goal 1 (Week 4): STGs = LTGs due to remaining LOS  Skilled Therapeutic Interventions/Progress Updates:    Pt completed ADL retraining with caregiver providing all physical assist and cues without requiring additional input from therapist.  Pt completed shower transfer with mod assist this session and demonstrated increased participation in LB dressing.  Pt continues to require cues for sequencing and modifying technique to increase success due to decreased problem solving and motor planning as well as cognitive deficits.  Pt fearful to trying new techniques and tends to shut down and state how a task is pointless when it is difficult or frustrating to him.  Pt's caregiver demonstrates good patience and is able to redirect pt.  Encouraged caregiver to continue to challenge pt as appropriate in familiar tasks and encourage midline posture while attempting LB dressing to decrease pushing to Rt.   Therapy Documentation Precautions:  Precautions Precautions: Fall Restrictions Weight Bearing Restrictions: No Pain: Pain Assessment Pain Assessment: No/denies pain  See FIM for current functional status  Therapy/Group: Individual Therapy  Simonne Come 09/25/2013, 11:25 AM

## 2013-09-25 NOTE — Progress Notes (Signed)
Occupational Therapy Discharge Summary  Patient Details  Name: Matthew Hodge MRN: [id number] Date of Birth: December 11, 1924  Today's Date: 09/25/2013  Patient has met 9 of 9 long term goals due to improved activity tolerance, improved balance, functional use of  RIGHT upper and RIGHT lower extremity, improved attention and caregiver education.  Patient to discharge at overall Mod Assist level.  Patient's care partner is independent to provide the necessary physical and cognitive assistance at discharge.    Reasons goals not met: N/A  Recommendation:  Patient will not benefit from follow up OT at this time as focus is being placed on mobility and feel that OT would be too frustrating to him at this time, pt will better tolerate PT in a functional context.  Equipment: No equipment provided  Reasons for discharge: treatment goals met and discharge from hospital  Patient/family agrees with progress made and goals achieved: Yes  OT Discharge Precautions/Restrictions  Precautions Precautions: Fall Pain Pain Assessment Pain Assessment: No/denies pain ADL  See FIM Vision/Perception  Vision - History Baseline Vision: Wears glasses all the time Patient Visual Report: No change from baseline Vision - Assessment Eye Alignment: Within Functional Limits Tracking/Visual Pursuits: Requires cues, head turns, or add eye shifts to track Perception Perception: Impaired Inattention/Neglect:  (decreased attention to Rt) Praxis Praxis: Impaired Praxis Impairment Details: Motor planning  Cognition Overall Cognitive Status: Impaired/Different from baseline Arousal/Alertness: Awake/alert Orientation Level: Oriented to person;Disoriented to time;Disoriented to situation Memory: Impaired Memory Impairment: Decreased recall of new information;Decreased short term memory;Decreased long term memory;Storage deficit;Retrieval deficit Awareness: Impaired Awareness Impairment: Intellectual  impairment Problem Solving: Impaired Problem Solving Impairment: Verbal basic;Functional basic Safety/Judgment: Impaired Sensation Sensation Light Touch: Appears Intact Stereognosis: Not tested Hot/Cold: Appears Intact Proprioception: Impaired by gross assessment Coordination Gross Motor Movements are Fluid and Coordinated: No Fine Motor Movements are Fluid and Coordinated: No Finger Nose Finger Test: Slightly decreased coordination BUE Rt>Lt Extremity/Trunk Assessment RUE Assessment RUE Assessment: Exceptions to [facility] (shoulder AROM 90 degrees, strength grossly 4/5) LUE Assessment LUE Assessment: Within Functional Limits  See FIM for current functional status  Maeve Debord, [facility] 09/25/2013, 11:25 AM

## 2013-09-25 NOTE — Progress Notes (Signed)
Physical Therapy Discharge Summary  Patient Details  Name: Matthew Hodge MRN: [id number] Date of Birth: [birth year]  Today's Date: 09/25/2013 Time: 1300-1356 and 1530-1630 Time Calculation (min): 56 min and 60 min  Patient has met 10 of 10 long term goals due to improved activity tolerance, improved balance, improved postural control, increased strength, increased range of motion, ability to compensate for deficits, functional use of  right upper extremity and right lower extremity and improved coordination.  Patient to discharge at a wheelchair level [place] for basic transfers, car transfers, gait very short distances with RW and AFO and max A for stair negotiation.   Patient's care partner is independent to provide the necessary physical, cognitive and supervision assistance at discharge.  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  Equipment: R blue rocker AFO, manual w/c  Reasons for discharge: treatment goals met and discharge from hospital  Patient/family agrees with progress made and goals achieved: Yes  1st session: Pt received w/c and blue rocker for home.  Added velcro to cushion, adjusted front casters to allow bilat LE to reach floor and demonstrated to caregiver how to remove drive wheels for storage in the car or adjusting STF height.  Pt transferred to new w/c stand pivot min-mod A.  Performed w/c mobility with bilat foot and UE propulsion x 100' supervision.  Pt transferred w/c > Nustep and performed bilat UE and LE ROM, coordination, strengthening and endurance x 8 minutes at level 6 resistance + 2 min with LE only for extensor  strengthening.  Performed gait with caregiver x 200' back to room with RW and caregiver mod A.  2nd session:  Focus on continued caregiver training for actual car transfer stand pivot with RW with mod A w/c <> car and no assistance to bring LE into the car but mod A to bring LE out of car and pivot feet completely around and stand from car.  Returned to gym and performed stair negotiation up/down 5 stairs x 2 reps with bilat rails and caregiver mod-max A overall with pt performing both alternating and step to sequence on stairs with verbal cues to descend with RLE secondary to AFO.  Performed gait with caregiver back to room x 120' with RW and AFO with 1-2 standing rest breaks to allow pt to reposition trunk in midline and RLE.  No further questions or concerns from pt or caregiver.    PT Discharge Precautions/Restrictions Precautions Precautions: Fall Pain Pain Assessment Pain Assessment: No/denies pain Vision/Perception  Vision - History Baseline Vision: Wears glasses all the time Patient Visual Report: No change from baseline Vision - Assessment Eye Alignment: Within Functional Limits Tracking/Visual Pursuits: Requires cues, head turns, or add eye shifts to track Perception Perception: Impaired Inattention/Neglect:  (decreased attention to RLE during mobility) Praxis Praxis: Impaired Praxis Impairment Details: Initiation;Motor planning Praxis-Other Comments: Intermittent impaired initiation of RLE during mobility; impaired motor planning and grading of movement  Cognition Overall Cognitive Status: Impaired/Different from baseline Arousal/Alertness: Awake/alert Orientation Level: Oriented to person;Disoriented to time;Disoriented to situation Memory: Impaired Memory Impairment: Decreased recall of new information;Decreased short term memory;Decreased long term memory;Storage deficit;Retrieval deficit Awareness: Impaired Awareness Impairment: Intellectual impairment Problem Solving:  Impaired Problem Solving  Impairment: Verbal basic;Functional basic Safety/Judgment: Impaired Sensation Sensation Light Touch: Appears Intact Stereognosis: Not tested Hot/Cold: Not tested Proprioception: Impaired by gross assessment Coordination Gross Motor Movements are Fluid and Coordinated: No Fine Motor Movements are Fluid and Coordinated: No Coordination and Movement Description: Movements rigid and with poor grading of movement Finger Nose Finger Test: Slightly decreased coordination BUE Rt>Lt Motor  Motor Motor: Hemiplegia;Abnormal postural alignment and control;Abnormal tone;Motor impersistence Motor - Discharge Observations: R hemiparesis with ADD tone in gait; motor impersistence RLE for hip and knee extension, impaired motor planning and grading of movement (increased pushing with LUE and LLE), intermittent R lateropulsion and retropulsion in sitting and standing  Mobility Bed Mobility Bed Mobility: Supine to Sit;Sit to Supine Supine to Sit: 4: Min assist Sit to Supine: 4: Min assist Sit to Supine - Details (indicate cue type and reason): Performs supine <> sit on hospital bed with caregiver assistance, min A for management of RLE Transfers Stand Pivot Transfers: 3: Mod assist;With armrests Stand Pivot Transfer Details (indicate cue type and reason): Pt performed multiple sit <> stand and stand pivot transfers w/c <> Nustep and mat/bed with mod lifting assistance and assistance for weight shifting while pivoting and verbal cues for hand placement and sequencing of transfer. Locomotion  Ambulation Ambulation/Gait Assistance: 3: Mod assist Ambulation Distance (Feet): 200 Feet Assistive device: Rolling walker;Other (Comment) (R blue rocker AFO) Ambulation/Gait Assistance Details: Performed gait with caregiver in controlled environment with RW and AFO donned with caregiver providing mod A to maintain position of RW to allow pt to maintain safe distance to RW, verbal and tactile  cues for LLE weight shift and full RLE step length and foot clearance when shuffling. Gait Gait Pattern: Impaired Gait Pattern: Step-to pattern;Decreased step length - right;Decreased step length - left;Decreased stance time - right;Decreased stride length;Decreased hip/knee flexion - right;Decreased dorsiflexion - right;Decreased weight shift to left;Right flexed knee in stance;Shuffle;Scissoring;Lateral trunk lean to right;Trunk flexed;Narrow base of support Stairs / Additional Locomotion Stairs: Yes Architect: Yes Wheelchair Assistance: 5: Supervision Wheelchair Propulsion: Both upper extremities;Both lower extermities Wheelchair Parts Management: Needs assistance Distance: 100 with bilat UE or bilat LE for reciprocal pattern  Trunk/Postural Assessment  Cervical Assessment Cervical Assessment:  (same as eval) Thoracic Assessment Thoracic Assessment: Exceptions to [facility] (improved but still slight R trunk flaring and L trunk shortening) Lumbar Assessment Lumbar Assessment:  (same as eval) Postural Control Postural Control:  (intermittent pushing retro and to the R when fatigued)  Balance Static Sitting Balance Static Sitting - Balance Support: No upper extremity supported Static Sitting - Level of Assistance: 5: Stand by assistance Dynamic Sitting Balance Dynamic Sitting - Balance Support: Right upper extremity supported;Left upper extremity supported Dynamic Sitting - Level of Assistance: 5: Stand by assistance Static Standing Balance Static Standing - Balance Support: Right upper extremity supported;Left upper extremity supported Static Standing - Level of Assistance: 3: Mod assist Dynamic Standing Balance Dynamic Standing - Balance Support: Left upper extremity supported;Right upper extremity supported Dynamic Standing - Level of Assistance: 3: Mod assist Extremity Assessment  RUE Assessment RUE Assessment: Exceptions to [facility] (shoulder AROM 90  degrees, strength grossly 4/5) LUE Assessment LUE Assessment: Within Functional Limits RLE Assessment RLE Assessment: Exceptions to [facility] RLE Strength RLE Overall Strength Comments: 4/5 overall but presents with motor impersistence LLE Assessment LLE Assessment: Within Functional Limits  See FIM for current functional status  [name] F[place]ette 09/25/2013, 2:26 PM

## 2013-09-25 NOTE — Discharge Summary (Signed)
Discharge summary job # [id number]

## 2013-09-26 DIAGNOSIS — D649 Anemia, unspecified: Secondary | ICD-10-CM

## 2013-09-26 DIAGNOSIS — K5900 Constipation, unspecified: Secondary | ICD-10-CM

## 2013-09-26 DIAGNOSIS — G309 Alzheimer's disease, unspecified: Secondary | ICD-10-CM

## 2013-09-26 DIAGNOSIS — F0280 Dementia in other diseases classified elsewhere without behavioral disturbance: Secondary | ICD-10-CM

## 2013-09-26 NOTE — Discharge Summary (Signed)
NAME:  Matthew Hodge, Matthew Hodge NO.:  [id number]  MEDICAL RECORD NO.:  [id number]  LOCATION:  4M07C                        FACILITY:  [place]  PHYSICIAN:  Charlett Blake, M.D.DATE OF BIRTH:  1924/10/12  DATE OF ADMISSION:  09/01/2013 DATE OF DISCHARGE:  09/26/2013                              DISCHARGE SUMMARY   DISCHARGE DIAGNOSES: 1. Thrombotic right middle cerebral artery infarction. 2. Sequential compression devices for DVT prophylaxis. 3. Dementia. 4. Urinary tract infection.  HISTORY OF PRESENT ILLNESS:  This is an 80 year old right-handed male with a documented history of Alzheimer's disease who lives with his wife and used a cane prior to admission.  The patient on no scheduled medications prior to admission.  He presented on August 27, 2013, with right-sided weakness and altered mental status.  Cranial CT scan showed no acute abnormalities, he did receive tPA.  MRI of the brain showed small patchy areas of acute infarction in the left parietal lobe, possible 5-mm acute infarction right parietal white matter.  MRA of the head with no large vessel occlusion.  Echocardiogram with ejection fraction of 55% and normal systolic function.  Carotid Dopplers with no ICA stenosis.  EEG was negative for seizure.  Neurology Service was consulted, maintained on aspirin for CVA prophylaxis.  He was tolerating a regular diet.  Physical and occupational therapy evaluation was completed.  The patient was felt to be a good candidate for inpatient rehab services was admitted for comprehensive rehab program.  PAST MEDICAL HISTORY:  See discharge diagnoses.  SOCIAL HISTORY:  Lives with spouse.  FUNCTIONAL HISTORY:  Prior to admission independent, retired.  FUNCTIONAL STATUS:  Upon admission to rehab services was +2 total assist for lower body dressing, +2 total assist for toilet transfers, +2 total assist for ambulation related to activities of daily living.  PHYSICAL  EXAMINATION:  VITAL SIGNS:  Blood pressure 135/83, pulse 87, temperature 98.1, respirations 18. GENERAL:  This was an alert male.  He was somewhat lethargic, but arousable.  He did make good eye contact with the examiner.  He was nonverbal except when answer yes/no questions, but inconsistent. EYES:  Pupils were round and reactive to light. LUNGS:  Clear to auscultation. CARDIAC:  Regular rate and rhythm. ABDOMEN:  Soft, nontender.  Good bowel sounds.  REHABILITATION HOSPITAL COURSE:  The patient was admitted to inpatient rehab services with therapies initiated on a 3-hour daily basis consisting of physical therapy, occupational therapy, speech therapy, and rehabilitation nursing.  The following issues were addressed during the patient's rehabilitation stay.  Pertaining to Matthew Hodge's thrombotic right MCA infarction remained stable, maintained on aspirin therapy.  He would follow up with Neurology Services.  Sequential compression devices were in place for DVT prophylaxis.  He did have a documented history of Alzheimer disease, baseline cognition deficits prior to CVA.  He was maintained on Aricept for his dementia.  A bed alarm was in place for the patient's safety.  He was maintained on Zocor for hyperlipidemia.  During his rehabilitation stay, he noted urinary tract infection of Providencia rettgeri.  He was placed on Cipro x7 days.  The patient received weekly collaborative interdisciplinary team conferences to discuss estimated length of stay, family  teaching, any barriers to his discharge.  He was requiring +1 max assist to ambulate 10 feet on a carpeted surface with moderate assist for wheelchair to a regular double bed to simulate a home environment.  The patient required cuing to facilitate keeping right lower extremity more abducted and decreased pushing leaned to the right.  The patient needed moderate to max assist, stand for a regular double bed.  Ongoing teaching  for activities of daily living.  Family noted narrow space to the bathroom. The patient was able to complete stand pivot from the toilet to tub bench in the shower.  He engaged in tub transfers x2 with his caregiver. Full family teaching was completed.  Plan was to be discharged to home on September 26, 2013, with ongoing therapies dictated per rehab services.  DISCHARGE MEDICATIONS:  At the time of dictation included: 1. Tylenol as needed. 2. Aspirin 325 mg p.o. daily. 3. Cipro 250 mg p.o. b.i.d. x5 days. 4. Aricept 5 mg p.o. at bedtime. 5. Protonix 40 mg p.o. daily. 6. Zocor 20 mg p.o. daily.  DIET:  Regular consistency.  FOLLOWUP:  He would follow up with Dr. Alysia Penna at the outpatient rehab center October 22, 2013, arrival at 9 a.m., Dr. Antony Contras, Neurology Services, call for appointment in 1 month, and Dr. Jenna Luo, primary care provider on October 09, 2013, appointment made 11:15 a.m.     Lauraine Rinne, P.A.   ______________________________ Charlett Blake, M.D.    DA/MEDQ  D:  09/25/2013  T:  09/26/2013  Job:  [id number]  cc:   Marrianne Mood, MD Pramod P. Leonie Man, MD

## 2013-09-26 NOTE — Progress Notes (Signed)
Matthew Hodge is a 80 y.o. male 1925/03/06 [id number]  Subjective: No new complaints. No new problems. Slept well. Feeling OK. Ready to go home  Objective: Vital signs in last 24 hours: Temp:  [97.4 F (36.3 C)] 97.4 F (36.3 C) (02/07 0545) Pulse Rate:  [81] 81 (02/07 0545) Resp:  [18] 18 (02/07 0545) BP: (154)/(83) 154/83 mmHg (02/07 0545) SpO2:  [98 %] 98 % (02/07 0545) Weight change:  Last BM Date: 09/24/13  Intake/Output from previous day: 02/06 0701 - 02/07 0700 In: 720 [P.O.:720] Out: 2175 [Urine:2175] Last cbgs: CBG (last 3)  No results found for this basename: GLUCAP,  in the last 72 hours   Physical Exam General: No apparent distress   Lungs: Normal effort. Lungs clear to auscultation, no crackles or wheezes. Cardiovascular: Regular rate and rhythm, no edema Abdomen: S/NT/ND; BS(+) Musculoskeletal:   Neurological: No new neurological deficits.  Wounds: dressing C/D/I   N/A       Lab Results: BMET    Component Value Date/Time   NA 136* 09/22/2013 1030   K 4.5 09/22/2013 1030   CL 97 09/22/2013 1030   CO2 25 09/22/2013 1030   GLUCOSE 119* 09/22/2013 1030   BUN 21 09/22/2013 1030   CREATININE 1.16 09/22/2013 1030   CREATININE 1.11 01/19/2013 1023   CALCIUM 9.0 09/22/2013 1030   GFRNONAA 55* 09/22/2013 1030   GFRAA 64* 09/22/2013 1030   CBC    Component Value Date/Time   WBC 6.9 09/02/2013 0620   RBC 4.17* 09/02/2013 0620   HGB 13.1 09/02/2013 0620   HCT 37.9* 09/02/2013 0620   PLT 284 09/02/2013 0620   MCV 90.9 09/02/2013 0620   MCH 31.4 09/02/2013 0620   MCHC 34.6 09/02/2013 0620   RDW 12.9 09/02/2013 0620   LYMPHSABS 0.9 09/02/2013 0620   MONOABS 0.7 09/02/2013 0620   EOSABS 0.6 09/02/2013 0620   BASOSABS 0.0 09/02/2013 0620    Studies/Results: No results found.  Medications: I have reviewed the patient's current medications.  Assessment/Plan:   1. Thrombotic Right MCA infarct  2. DVT Prophylaxis/Anticoagulation: SCDs.Monitor for any signs of DVT.  3. Pain  Management: Tylenol as needed  4. Mood/dementia:[name] prior to CVA. Worsening after bilateral parietal infarcts  - Bed alarm for safety  - trial of aricept 10mg  with no SE thus far- neuro f/u as outpt  5. Neuropsych: This patient is not capable of making decisions on his own behalf.  6. Hx bladder outlet obstruction chronic supr pubic cath,change at urology office as outpt  D/c home   Length of stay, days: [place] , MD 09/26/2013, 8:29 AM

## 2013-09-26 NOTE — Progress Notes (Signed)
Pt d/c with caregiver [name] at 0930. All instructions given yesterday and [name] states he understands with no further questions. Hortencia Conradi RN

## 2013-09-30 ENCOUNTER — Ambulatory Visit: Payer: Medicare Other | Attending: Physical Medicine & Rehabilitation

## 2013-09-30 DIAGNOSIS — R279 Unspecified lack of coordination: Secondary | ICD-10-CM | POA: Diagnosis not present

## 2013-09-30 DIAGNOSIS — R269 Unspecified abnormalities of gait and mobility: Secondary | ICD-10-CM | POA: Diagnosis not present

## 2013-09-30 DIAGNOSIS — IMO0001 Reserved for inherently not codable concepts without codable children: Secondary | ICD-10-CM | POA: Insufficient documentation

## 2013-10-01 ENCOUNTER — Ambulatory Visit: Payer: Medicare Other | Admitting: Physical Therapy

## 2013-10-01 DIAGNOSIS — IMO0001 Reserved for inherently not codable concepts without codable children: Secondary | ICD-10-CM | POA: Diagnosis not present

## 2013-10-06 ENCOUNTER — Ambulatory Visit: Payer: Medicare Other

## 2013-10-07 ENCOUNTER — Ambulatory Visit: Payer: Medicare Other

## 2013-10-09 ENCOUNTER — Inpatient Hospital Stay: Payer: Medicare Other | Admitting: Family Medicine

## 2013-10-13 ENCOUNTER — Inpatient Hospital Stay: Payer: Medicare Other | Admitting: Family Medicine

## 2013-10-14 ENCOUNTER — Ambulatory Visit: Payer: Medicare Other

## 2013-10-21 ENCOUNTER — Ambulatory Visit: Payer: Medicare Other | Admitting: Physical Therapy

## 2013-10-21 ENCOUNTER — Other Ambulatory Visit (HOSPITAL_COMMUNITY): Payer: Self-pay | Admitting: Physical Medicine and Rehabilitation

## 2013-10-22 ENCOUNTER — Encounter: Payer: Medicare Other | Admitting: Physical Medicine and Rehabilitation

## 2013-10-22 ENCOUNTER — Telehealth: Payer: Self-pay | Admitting: Physical Medicine & Rehabilitation

## 2013-10-22 NOTE — Telephone Encounter (Signed)
Pt's daughter called in this morning; states she was unaware of dad's appointment; unable to bring dad in; his is not mobile to move on his on and they are not able to move him themselves. Lori at this time is trying to get home therapy for the patient bc she cnc all the out patient therapy due to patient unable to get out the bed.... Pt's daughter name is Cecille Rubin if you have any questions or concerns please call her at [phone] ext [phone]

## 2013-10-23 ENCOUNTER — Ambulatory Visit: Payer: Medicare Other | Admitting: Physical Therapy

## 2013-10-26 ENCOUNTER — Other Ambulatory Visit: Payer: Self-pay | Admitting: Family Medicine

## 2013-10-26 DIAGNOSIS — R296 Repeated falls: Secondary | ICD-10-CM

## 2013-10-28 ENCOUNTER — Encounter: Payer: Medicare Other | Admitting: Physical Therapy

## 2013-11-12 ENCOUNTER — Other Ambulatory Visit (HOSPITAL_COMMUNITY): Payer: Self-pay | Admitting: Family Medicine

## 2013-11-12 NOTE — Telephone Encounter (Signed)
Refill appropriate and filled per protocol. 

## 2013-11-17 ENCOUNTER — Other Ambulatory Visit (HOSPITAL_COMMUNITY): Payer: Self-pay | Admitting: Physical Medicine and Rehabilitation

## 2013-11-17 ENCOUNTER — Telehealth: Payer: Self-pay | Admitting: Family Medicine

## 2013-11-17 MED ORDER — DONEPEZIL HCL 5 MG PO TABS: 5.0000 mg | ORAL_TABLET | Freq: Every day | ORAL | Status: DC

## 2013-11-17 NOTE — Telephone Encounter (Signed)
Rx Refilled  

## 2013-11-17 NOTE — Telephone Encounter (Signed)
Call back number is [phone] ext [phone]  Pharmacy [facility] [place]  Pt daughter is calling to see if her dad can get a refill on Donepezil Hcl 5 mg

## 2013-12-18 ENCOUNTER — Telehealth: Payer: Self-pay | Admitting: Family Medicine

## 2013-12-18 NOTE — Telephone Encounter (Signed)
Patients daughter Cecille Rubin calling regarding getting her dad some anxiety medication if possible please call her back at [phone]

## 2013-12-24 ENCOUNTER — Telehealth: Payer: Self-pay | Admitting: *Deleted

## 2013-12-24 NOTE — Telephone Encounter (Signed)
Daughter called stating that pt has alzheimers and is getting worse and wants to know if you can give him something to help calm him down with his moods and anxiety, she mentioned ablify or namenda per the aide that comes out to see him. Please advise!

## 2013-12-24 NOTE — Telephone Encounter (Signed)
Pt daughter is aware and will have someone to come and pick up the samples today

## 2013-12-24 NOTE — Telephone Encounter (Signed)
We could certainly try the namenda xr.  Begin the starter pack and increase weekly until on 28 mg poqday.

## 2014-01-20 ENCOUNTER — Ambulatory Visit: Payer: Medicare Other | Admitting: Neurology

## 2014-01-21 ENCOUNTER — Other Ambulatory Visit (HOSPITAL_COMMUNITY): Payer: Self-pay | Admitting: Family Medicine

## 2014-02-12 ENCOUNTER — Telehealth: Payer: Self-pay | Admitting: *Deleted

## 2014-02-12 NOTE — Telephone Encounter (Signed)
Bess Kinds from [facility] called stating needing an order for new mattress for hospital bed his old his sagging in the middle and needs a new one. If any questions she said you can call her at [phone]

## 2014-02-12 NOTE — Telephone Encounter (Signed)
Faxed order for new bed to [facility]

## 2014-02-15 NOTE — Telephone Encounter (Signed)
Will need face to face appointment with provider in order to get new mattress. [facility] aware and will relay message to pt.

## 2014-03-02 ENCOUNTER — Inpatient Hospital Stay (HOSPITAL_COMMUNITY)
Admission: EM | Admit: 2014-03-02 | Discharge: 2014-03-05 | DRG: 178 | Disposition: A | Discharge status: Home / Self Care | Payer: Medicare Other | Attending: Family Medicine | Admitting: Family Medicine

## 2014-03-02 ENCOUNTER — Emergency Department (HOSPITAL_COMMUNITY): Payer: Medicare Other

## 2014-03-02 ENCOUNTER — Inpatient Hospital Stay (HOSPITAL_COMMUNITY): Payer: Medicare Other

## 2014-03-02 ENCOUNTER — Encounter (HOSPITAL_COMMUNITY): Payer: Self-pay | Admitting: Emergency Medicine

## 2014-03-02 DIAGNOSIS — N138 Other obstructive and reflux uropathy: Secondary | ICD-10-CM | POA: Diagnosis present

## 2014-03-02 DIAGNOSIS — N390 Urinary tract infection, site not specified: Secondary | ICD-10-CM | POA: Diagnosis present

## 2014-03-02 DIAGNOSIS — K5909 Other constipation: Secondary | ICD-10-CM | POA: Diagnosis present

## 2014-03-02 DIAGNOSIS — Z85828 Personal history of other malignant neoplasm of skin: Secondary | ICD-10-CM | POA: Diagnosis not present

## 2014-03-02 DIAGNOSIS — N401 Enlarged prostate with lower urinary tract symptoms: Secondary | ICD-10-CM

## 2014-03-02 DIAGNOSIS — R4182 Altered mental status, unspecified: Secondary | ICD-10-CM | POA: Diagnosis present

## 2014-03-02 DIAGNOSIS — Z8249 Family history of ischemic heart disease and other diseases of the circulatory system: Secondary | ICD-10-CM

## 2014-03-02 DIAGNOSIS — I639 Cerebral infarction, unspecified: Secondary | ICD-10-CM | POA: Diagnosis present

## 2014-03-02 DIAGNOSIS — R5383 Other fatigue: Secondary | ICD-10-CM

## 2014-03-02 DIAGNOSIS — I69991 Dysphagia following unspecified cerebrovascular disease: Secondary | ICD-10-CM | POA: Diagnosis not present

## 2014-03-02 DIAGNOSIS — Z823 Family history of stroke: Secondary | ICD-10-CM

## 2014-03-02 DIAGNOSIS — I69998 Other sequelae following unspecified cerebrovascular disease: Secondary | ICD-10-CM

## 2014-03-02 DIAGNOSIS — R339 Retention of urine, unspecified: Secondary | ICD-10-CM | POA: Diagnosis present

## 2014-03-02 DIAGNOSIS — J189 Pneumonia, unspecified organism: Secondary | ICD-10-CM | POA: Insufficient documentation

## 2014-03-02 DIAGNOSIS — R29898 Other symptoms and signs involving the musculoskeletal system: Secondary | ICD-10-CM | POA: Diagnosis present

## 2014-03-02 DIAGNOSIS — Z66 Do not resuscitate: Secondary | ICD-10-CM | POA: Diagnosis present

## 2014-03-02 DIAGNOSIS — J90 Pleural effusion, not elsewhere classified: Secondary | ICD-10-CM | POA: Diagnosis present

## 2014-03-02 DIAGNOSIS — F0280 Dementia in other diseases classified elsewhere without behavioral disturbance: Secondary | ICD-10-CM | POA: Diagnosis present

## 2014-03-02 DIAGNOSIS — K219 Gastro-esophageal reflux disease without esophagitis: Secondary | ICD-10-CM | POA: Diagnosis present

## 2014-03-02 DIAGNOSIS — K5900 Constipation, unspecified: Secondary | ICD-10-CM

## 2014-03-02 DIAGNOSIS — Z7982 Long term (current) use of aspirin: Secondary | ICD-10-CM

## 2014-03-02 DIAGNOSIS — R338 Other retention of urine: Secondary | ICD-10-CM

## 2014-03-02 DIAGNOSIS — R531 Weakness: Secondary | ICD-10-CM | POA: Diagnosis present

## 2014-03-02 DIAGNOSIS — G309 Alzheimer's disease, unspecified: Secondary | ICD-10-CM | POA: Diagnosis present

## 2014-03-02 DIAGNOSIS — R410 Disorientation, unspecified: Secondary | ICD-10-CM

## 2014-03-02 DIAGNOSIS — D649 Anemia, unspecified: Secondary | ICD-10-CM

## 2014-03-02 DIAGNOSIS — J690 Pneumonitis due to inhalation of food and vomit: Secondary | ICD-10-CM | POA: Diagnosis present

## 2014-03-02 DIAGNOSIS — D72829 Elevated white blood cell count, unspecified: Secondary | ICD-10-CM

## 2014-03-02 DIAGNOSIS — R404 Transient alteration of awareness: Secondary | ICD-10-CM

## 2014-03-02 DIAGNOSIS — R739 Hyperglycemia, unspecified: Secondary | ICD-10-CM

## 2014-03-02 DIAGNOSIS — R5381 Other malaise: Secondary | ICD-10-CM

## 2014-03-02 HISTORY — DX: Cerebral infarction, unspecified: I63.9

## 2014-03-02 LAB — COMPREHENSIVE METABOLIC PANEL
ALBUMIN: 3.1 g/dL — AB (ref 3.5–5.2)
ALT: 8 U/L (ref 0–53)
AST: 20 U/L (ref 0–37)
Alkaline Phosphatase: 72 U/L (ref 39–117)
Anion gap: 16 — ABNORMAL HIGH (ref 5–15)
BUN: 22 mg/dL (ref 6–23)
CO2: 22 mEq/L (ref 19–32)
CREATININE: 1.1 mg/dL (ref 0.50–1.35)
Calcium: 9 mg/dL (ref 8.4–10.5)
Chloride: 95 mEq/L — ABNORMAL LOW (ref 96–112)
GFR calc Af Amer: 68 mL/min — ABNORMAL LOW (ref >90)
GFR calc non Af Amer: 58 mL/min — ABNORMAL LOW (ref >90)
GLUCOSE: 89 mg/dL (ref 70–99)
Potassium: 4.6 mEq/L (ref 3.7–5.3)
Sodium: 133 mEq/L — ABNORMAL LOW (ref 137–147)
TOTAL PROTEIN: 7.2 g/dL (ref 6.0–8.3)
Total Bilirubin: 0.5 mg/dL (ref 0.3–1.2)

## 2014-03-02 LAB — URINALYSIS, ROUTINE W REFLEX MICROSCOPIC
Glucose, UA: NEGATIVE mg/dL
Ketones, ur: NEGATIVE mg/dL
NITRITE: NEGATIVE
PROTEIN: 30 mg/dL — AB
SPECIFIC GRAVITY, URINE: 1.023 (ref 1.005–1.030)
Urobilinogen, UA: 0.2 mg/dL (ref 0.0–1.0)
pH: 5.5 (ref 5.0–8.0)

## 2014-03-02 LAB — CBC WITH DIFFERENTIAL/PLATELET
BASOS PCT: 0 % (ref 0–1)
Basophils Absolute: 0 10*3/uL (ref 0.0–0.1)
EOS ABS: 0.2 10*3/uL (ref 0.0–0.7)
EOS PCT: 2 % (ref 0–5)
HCT: 35.4 % — ABNORMAL LOW (ref 39.0–52.0)
Hemoglobin: 11.7 g/dL — ABNORMAL LOW (ref 13.0–17.0)
LYMPHS ABS: 1.5 10*3/uL (ref 0.7–4.0)
Lymphocytes Relative: 16 % (ref 12–46)
MCH: 29.7 pg (ref 26.0–34.0)
MCHC: 33.1 g/dL (ref 30.0–36.0)
MCV: 89.8 fL (ref 78.0–100.0)
MONO ABS: 1.2 10*3/uL — AB (ref 0.1–1.0)
MONOS PCT: 12 % (ref 3–12)
Neutro Abs: 6.8 10*3/uL (ref 1.7–7.7)
Neutrophils Relative %: 70 % (ref 43–77)
Platelets: 348 10*3/uL (ref 150–400)
RBC: 3.94 MIL/uL — AB (ref 4.22–5.81)
RDW: 13.8 % (ref 11.5–15.5)
WBC: 9.6 10*3/uL (ref 4.0–10.5)

## 2014-03-02 LAB — URINE MICROSCOPIC-ADD ON

## 2014-03-02 LAB — TROPONIN I

## 2014-03-02 LAB — LACTIC ACID, PLASMA: LACTIC ACID, VENOUS: 2.3 mmol/L — AB (ref 0.5–2.2)

## 2014-03-02 MED ORDER — SIMVASTATIN 20 MG PO TABS
20.0000 mg | ORAL_TABLET | Freq: Every day | ORAL | Status: DC
  Administered 2014-03-02 – 2014-03-05 (×4): 20 mg via ORAL
  Filled 2014-03-02 (×4): qty 1

## 2014-03-02 MED ORDER — SENNA-DOCUSATE SODIUM 8.6-50 MG PO TABS: 2.0000 | ORAL_TABLET | Freq: Every day | ORAL | Status: DC

## 2014-03-02 MED ORDER — ASPIRIN 325 MG PO TABS: 325.0000 mg | ORAL_TABLET | Freq: Every day | ORAL | Status: DC

## 2014-03-02 MED ORDER — DEXTROSE 5 % IV SOLN
1.0000 g | Freq: Once | INTRAVENOUS | Status: AC
  Administered 2014-03-02: 1 g via INTRAVENOUS
  Filled 2014-03-02: qty 10

## 2014-03-02 MED ORDER — SODIUM CHLORIDE 0.9 % IV SOLN
INTRAVENOUS | Status: DC
  Administered 2014-03-03 – 2014-03-05 (×5): via INTRAVENOUS

## 2014-03-02 MED ORDER — SENNOSIDES-DOCUSATE SODIUM 8.6-50 MG PO TABS
2.0000 | ORAL_TABLET | Freq: Every day | ORAL | Status: DC
  Administered 2014-03-02 – 2014-03-04 (×2): 2 via ORAL
  Filled 2014-03-02 (×3): qty 2

## 2014-03-02 MED ORDER — ENOXAPARIN SODIUM 40 MG/0.4ML ~~LOC~~ SOLN
40.0000 mg | SUBCUTANEOUS | Status: DC
  Administered 2014-03-02 – 2014-03-04 (×3): 40 mg via SUBCUTANEOUS
  Filled 2014-03-02 (×4): qty 0.4

## 2014-03-02 MED ORDER — IOHEXOL 300 MG/ML  SOLN
100.0000 mL | Freq: Once | INTRAMUSCULAR | Status: AC | PRN
  Administered 2014-03-02: 100 mL via INTRAVENOUS

## 2014-03-02 MED ORDER — PANTOPRAZOLE SODIUM 40 MG PO TBEC
40.0000 mg | DELAYED_RELEASE_TABLET | Freq: Every day | ORAL | Status: DC
  Administered 2014-03-02 – 2014-03-05 (×4): 40 mg via ORAL
  Filled 2014-03-02 (×3): qty 1

## 2014-03-02 MED ORDER — ASPIRIN 325 MG PO TABS
325.0000 mg | ORAL_TABLET | Freq: Every day | ORAL | Status: DC
  Administered 2014-03-02 – 2014-03-05 (×4): 325 mg via ORAL
  Filled 2014-03-02 (×4): qty 1

## 2014-03-02 MED ORDER — SODIUM CHLORIDE 0.9 % IV BOLUS (SEPSIS)
1000.0000 mL | Freq: Once | INTRAVENOUS | Status: AC
  Administered 2014-03-02: 1000 mL via INTRAVENOUS

## 2014-03-02 MED ORDER — DEXTROSE 5 % IV SOLN
500.0000 mg | Freq: Once | INTRAVENOUS | Status: DC
  Filled 2014-03-02: qty 500

## 2014-03-02 MED ORDER — DONEPEZIL HCL 5 MG PO TABS
5.0000 mg | ORAL_TABLET | Freq: Every day | ORAL | Status: DC
  Administered 2014-03-02 – 2014-03-04 (×3): 5 mg via ORAL
  Filled 2014-03-02 (×4): qty 1

## 2014-03-02 MED ORDER — DEXTROSE 5 % IV SOLN
500.0000 mg | Freq: Once | INTRAVENOUS | Status: AC
  Administered 2014-03-03: 500 mg via INTRAVENOUS
  Filled 2014-03-02: qty 500

## 2014-03-02 MED ORDER — PANTOPRAZOLE SODIUM 40 MG PO TBEC: 40.0000 mg | DELAYED_RELEASE_TABLET | Freq: Every day | ORAL | Status: DC

## 2014-03-02 MED ORDER — SODIUM CHLORIDE 0.9 % IV SOLN
1.5000 g | Freq: Four times a day (QID) | INTRAVENOUS | Status: DC
  Administered 2014-03-02 – 2014-03-05 (×11): 1.5 g via INTRAVENOUS
  Filled 2014-03-02 (×16): qty 1.5

## 2014-03-02 NOTE — ED Notes (Signed)
Patient transported to CT 

## 2014-03-02 NOTE — Progress Notes (Addendum)
Received report from Jarrett Soho, [place] in ED. RN Will bring pt after CT.

## 2014-03-02 NOTE — ED Provider Notes (Signed)
I saw and evaluated the patient, reviewed the resident's note and I agree with the findings and plan.   EKG Interpretation   Date/Time:  Tuesday March 02 2014 16:57:26 EDT Ventricular Rate:  116 PR Interval:  212 QRS Duration: 84 QT Interval:  306 QTC Calculation: 425 R Axis:   8 Text Interpretation:  Sinus tachycardia Ventricular bigeminy Borderline  prolonged PR interval Borderline repolarization abnormality changed from  prior ecg, bigeminy is new Confirmed by Kenyatta Gloeckner  MD, Adelene Polivka ([id number]) on  03/02/2014 5:35:07 PM      Patient with concerning loculated effusion of his left lower lobe.  Concerning for post obstructive pneumonia with possible parapneumonic effusion.  CT scan pending.  Patient may the hospital.  Antibiotics given.  Altered mental status likely secondary to delirium  Dg Chest 2 View  03/02/2014   CLINICAL DATA:  Difficulty breathing  EXAM: CHEST  2 VIEW  COMPARISON:  March 10, 2007  FINDINGS: There is a loculated effusion with consolidation in the left lower lobe and portion of the lingula inferiorly. Right lung is clear. Heart is upper normal in size with pulmonary vascularity within normal limits. No adenopathy. There is degenerative change in the thoracic spine. There is marked collapse of an upper lumbar vertebral body.  IMPRESSION: Loculated effusion on the left with areas of consolidation in portions of the left lower lobe and lingula. Right lung is clear. Marked collapse of an upper lumbar vertebral body is noted.   Electronically Signed   By: Lowella Grip M.D.   On: 03/02/2014 19:15   Ct Head Wo Contrast  03/02/2014   CLINICAL DATA:  Altered mental status  EXAM: CT HEAD WITHOUT CONTRAST  TECHNIQUE: Contiguous axial images were obtained from the base of the skull through the vertex without intravenous contrast.  COMPARISON:  Head CT 2015 brain MRI  FINDINGS: Chronic cerebral atrophy and ex vacuo dilatation of the ventricles, stable. Stable left middle cranial fossa  arachnoid cyst. Negative for intra or extra-axial hemorrhage, mass effect, mass lesion, or evidence of acute cortically based infarction. The skull is intact. The visualized paranasal sinuses show some mucosal thickening in the right frontal sinus, chronic and mild mucosal thickening in the left frontal sinus. No air-fluid levels. Small chronic right mastoid effusion. Left mastoid air cells are clear. The skull is intact. Soft tissues of the scalp are stable in show no evidence of acute abnormality.  IMPRESSION: No acute intracranial abnormality.  Chronic cerebral atrophy.   Electronically Signed   By: Curlene Dolphin M.D.   On: 03/02/2014 19:02  I personally reviewed the imaging tests through PACS system I reviewed available ER/hospitalization records through the [place], MD 03/02/14 2029

## 2014-03-02 NOTE — ED Provider Notes (Signed)
CSN: [id number]     Arrival date & time 03/02/14  1643 History   First MD Initiated Contact with Patient 03/02/14 1702     Chief Complaint  Patient presents with  . Altered Mental Status     (Consider location/radiation/quality/duration/timing/severity/associated sxs/prior Treatment) Patient is a 80 y.o. male presenting with altered mental status.  Altered Mental Status Presenting symptoms: confusion   Associated symptoms: weakness   Associated symptoms: no abdominal pain, no fever, no hallucinations, no headaches, no light-headedness, no nausea, no palpitations and no seizures     Patient is 80 yo male with hx of CVA Jan 2015, Alzeimer's dementia, here with worsening of his baseline confusion this morning per family. He appeared drowsy is less responsive than usual. He also appeared more pale that usual to the family (has hx of anemia).   Per EMS, his BP was 105/55 and HR 46. Reports decreased appetite, decreased PO, and decreased UOP for last few days. Has permanent urinary cathether because of bladder sx. Denies any urinary symptoms. Denies any fever/chills. Has been coughing and has swallowing difficulty because of previous CVA. He has right sided weakness from the CVA which is improving, able to walk with a walker now.   Denies any new medications, denies n/v/diarrhea. Has constipation. Denies hematochezia/melena, hemoptysis. Denies any chest pain/sob.   Past Medical History  Diagnosis Date  . Alzheimer's disease   . Skin cancer   . Chronic constipation   . Stroke     lower extremity deficits   Past Surgical History  Procedure Laterality Date  . Hand tendon surgery    . Cataract extraction, bilateral    . Skin cancer removal forehead    . Green light laser turp (transurethral resection of prostate  08/11/2012    Procedure: GREEN LIGHT LASER TURP (TRANSURETHRAL RESECTION OF PROSTATE;  Surgeon: Ailene Rud, MD;  Location: [facility];  Service: Urology;  Laterality: N/A;   Julianne Rice Laser of Prostate  . Insertion of suprapubic catheter  08/11/2012    Procedure: INSERTION OF SUPRAPUBIC CATHETER;  Surgeon: Ailene Rud, MD;  Location: [facility];  Service: Urology;  Laterality: N/A;  . Circumcision  08/11/2012    Procedure: CIRCUMCISION ADULT;  Surgeon: Ailene Rud, MD;  Location: [facility];  Service: Urology;  Laterality: N/A;  DORSAL SLIT CIRCUMCISION  . Cystoscopy  08/11/2012    Procedure: CYSTOSCOPY;  Surgeon: Ailene Rud, MD;  Location: [facility];  Service: Urology;  Laterality: N/A;   Family History  Problem Relation Age of Onset  . Stroke Mother   . Stroke Father   . COPD Brother   . Heart disease Brother   . Diabetes Brother    History  Substance Use Topics  . Smoking status: Never Smoker   . Smokeless tobacco: Never Used  . Alcohol Use: No    Review of Systems  Constitutional: Positive for appetite change. Negative for fever, chills and diaphoresis.  HENT: Negative for congestion, ear pain, hearing loss, mouth sores, sinus pressure and sore throat.   Respiratory: Positive for cough. Negative for apnea, choking, chest tightness, shortness of breath and wheezing.   Cardiovascular: Negative for chest pain, palpitations and leg swelling.  Gastrointestinal: Positive for constipation. Negative for nausea, abdominal pain, diarrhea, blood in stool and rectal pain.  Endocrine: Negative.   Genitourinary: Negative.   Musculoskeletal: Negative.   Skin: Positive for pallor.  Allergic/Immunologic: Negative.   Neurological: Positive for weakness. Negative for dizziness, tremors, seizures, syncope, facial asymmetry,  speech difficulty, light-headedness, numbness and headaches.  Psychiatric/Behavioral: Positive for confusion. Negative for hallucinations.      Allergies  Review of patient's allergies indicates no known allergies.  Home Medications   Prior to Admission medications   Medication Sig Start Date End Date Taking?  Authorizing Provider  aspirin 325 MG tablet Take 1 tablet (325 mg total) by mouth daily. 09/25/13   Bary Leriche, PA-C  ciprofloxacin (CIPRO) 250 MG tablet Take 1 tablet (250 mg total) by mouth 2 (two) times daily. For UTI 09/25/13   Ivan Anchors Love, PA-C  donepezil (ARICEPT) 5 MG tablet Take 1 tablet (5 mg total) by mouth at bedtime. 11/17/13   Susy Frizzle, MD  pantoprazole (PROTONIX) 40 MG tablet Take 1 tablet (40 mg total) by mouth daily. 09/25/13   Ivan Anchors Love, PA-C  senna-docusate (SENOKOT-S) 8.6-50 MG per tablet Take 2 tablets by mouth daily. 09/25/13   Bary Leriche, PA-C  simvastatin (ZOCOR) 20 MG tablet TAKE 1 TABLET (20 MG TOTAL) BY MOUTH DAILY AT 6 PM. 01/21/14   Susy Frizzle, MD   BP 117/49  Pulse 59  Temp(Src) 100.5 F (38.1 C) (Rectal)  Resp 22  SpO2 97% Physical Exam  Constitutional: He is cooperative.  HENT:  Head: Normocephalic and atraumatic.  Right Ear: External ear normal.  Left Ear: External ear normal.  Eyes: Conjunctivae and EOM are normal. Pupils are equal, round, and reactive to light.  Neck: Normal range of motion. Neck supple. No JVD present.  Cardiovascular: Normal rate, regular rhythm, normal heart sounds and intact distal pulses.  Exam reveals no gallop and no friction rub.   No murmur heard. Pulmonary/Chest: Effort normal and breath sounds normal. Not tachypneic. He has no wheezes. He has no rales. He exhibits no tenderness.  Abdominal: Soft. Bowel sounds are normal. He exhibits no distension. There is no tenderness. There is no rebound.  Musculoskeletal: Normal range of motion. He exhibits no edema and no tenderness.  Neurological: He is alert. No cranial nerve deficit or sensory deficit.  Oriented to self, family, and place, not to time.  Strength 4/5 bilaterally upper and lower extremities.   Skin: Skin is warm and dry.    ED Course  Procedures (including critical care time) Labs Review Labs Reviewed  CBC WITH DIFFERENTIAL - Abnormal; Notable for  the following:    RBC 3.94 (*)    Hemoglobin 11.7 (*)    HCT 35.4 (*)    Monocytes Absolute 1.2 (*)    All other components within normal limits  COMPREHENSIVE METABOLIC PANEL - Abnormal; Notable for the following:    Sodium 133 (*)    Chloride 95 (*)    Albumin 3.1 (*)    GFR calc non Af Amer 58 (*)    GFR calc Af Amer 68 (*)    Anion gap 16 (*)    All other components within normal limits  LACTIC ACID, PLASMA - Abnormal; Notable for the following:    Lactic Acid, Venous 2.3 (*)    All other components within normal limits  URINE CULTURE  CULTURE, BLOOD (ROUTINE X 2)  CULTURE, BLOOD (ROUTINE X 2)  TROPONIN I  URINALYSIS, ROUTINE W REFLEX MICROSCOPIC    Imaging Review Dg Chest 2 View  03/02/2014   CLINICAL DATA:  Difficulty breathing  EXAM: CHEST  2 VIEW  COMPARISON:  March 10, 2007  FINDINGS: There is a loculated effusion with consolidation in the left lower lobe and portion of the  lingula inferiorly. Right lung is clear. Heart is upper normal in size with pulmonary vascularity within normal limits. No adenopathy. There is degenerative change in the thoracic spine. There is marked collapse of an upper lumbar vertebral body.  IMPRESSION: Loculated effusion on the left with areas of consolidation in portions of the left lower lobe and lingula. Right lung is clear. Marked collapse of an upper lumbar vertebral body is noted.   Electronically Signed   By: Lowella Grip M.D.   On: 03/02/2014 19:15   Ct Head Wo Contrast  03/02/2014   CLINICAL DATA:  Altered mental status  EXAM: CT HEAD WITHOUT CONTRAST  TECHNIQUE: Contiguous axial images were obtained from the base of the skull through the vertex without intravenous contrast.  COMPARISON:  Head CT 2015 brain MRI  FINDINGS: Chronic cerebral atrophy and ex vacuo dilatation of the ventricles, stable. Stable left middle cranial fossa arachnoid cyst. Negative for intra or extra-axial hemorrhage, mass effect, mass lesion, or evidence of acute  cortically based infarction. The skull is intact. The visualized paranasal sinuses show some mucosal thickening in the right frontal sinus, chronic and mild mucosal thickening in the left frontal sinus. No air-fluid levels. Small chronic right mastoid effusion. Left mastoid air cells are clear. The skull is intact. Soft tissues of the scalp are stable in show no evidence of acute abnormality.  IMPRESSION: No acute intracranial abnormality.  Chronic cerebral atrophy.   Electronically Signed   By: Curlene Dolphin M.D.   On: 03/02/2014 19:02     EKG Interpretation   Date/Time:  Tuesday March 02 2014 16:57:26 EDT Ventricular Rate:  116 PR Interval:  212 QRS Duration: 84 QT Interval:  306 QTC Calculation: 425 R Axis:   8 Text Interpretation:  Sinus tachycardia Ventricular bigeminy Borderline  prolonged PR interval Borderline repolarization abnormality changed from  prior ecg, bigeminy is new Confirmed by CAMPOS  MD, KEVIN ([id number]) on  03/02/2014 5:35:07 PM      EKG shows vent bigeminy.  Will do workup for delirium: CBC, CMP, lactic acid, CT head, CXR, UA, blood culturex2.  Will check rectal temp. Bolused 1 L.   Rectal temp was 100.5 Lactic acid  Mildly elevated 2.3. Na slightly low 133.  CT head negative. CXR showed loculated effusion on left lower lobe and ligula.   UA shows few bacterial with hyaline casts.  MDM   Final diagnoses:  CAP (community acquired pneumonia)  Delirium   Patient's AMS is likely due to CAP based on CXR finding showing LLL loculated effusion and rectal temp 100.5. Will admit to hospitalist Dr. Serita Grit service for CAP management.      Dellia Nims, MD 03/02/14 7622

## 2014-03-02 NOTE — H&P (Signed)
[facility] History and Physical  Patient: Matthew Hodge  BWI:[id number]  DOB: 22-May-1925  DOS: the patient was seen and examined on 03/02/2014 PCP: Odette Fraction, MD  Chief Complaint: Confusion  HPI: Matthew Hodge is a 80 y.o. male with Past medical history of Alzheimer's dementia, CVA, dysphagia. Patient was brought in with the complaint of worsening confusion. Patient does have her dementia and has mild cognitive imbalance at his baseline. Since last to 3 days he has been more drowsy. And he has decreased appetite and decreased oral intake. He also had decreased urine output in his chronic Foley catheter. He had an episode of vomiting with cough. Since last to 3 days he has been having progressively choking episodes.  The patient is coming from home. And at his baseline independent for most of his ADL.  Review of Systems: as mentioned in the history of present illness.  A Comprehensive review of the other systems is negative.  Past Medical History  Diagnosis Date  . Alzheimer's disease   . Skin cancer   . Chronic constipation   . Stroke     lower extremity deficits   Past Surgical History  Procedure Laterality Date  . Hand tendon surgery    . Cataract extraction, bilateral    . Skin cancer removal forehead    . Green light laser turp (transurethral resection of prostate  08/11/2012    Procedure: GREEN LIGHT LASER TURP (TRANSURETHRAL RESECTION OF PROSTATE;  Surgeon: Ailene Rud, MD;  Location: [facility];  Service: Urology;  Laterality: N/A;  Julianne Rice Laser of Prostate  . Insertion of suprapubic catheter  08/11/2012    Procedure: INSERTION OF SUPRAPUBIC CATHETER;  Surgeon: Ailene Rud, MD;  Location: [facility];  Service: Urology;  Laterality: N/A;  . Circumcision  08/11/2012    Procedure: CIRCUMCISION ADULT;  Surgeon: Ailene Rud, MD;  Location: [facility];  Service: Urology;  Laterality: N/A;  DORSAL SLIT CIRCUMCISION  . Cystoscopy   08/11/2012    Procedure: CYSTOSCOPY;  Surgeon: Ailene Rud, MD;  Location: [facility];  Service: Urology;  Laterality: N/A;   Social History:  reports that he has never smoked. He has never used smokeless tobacco. He reports that he does not drink alcohol or use illicit drugs.  No Known Allergies  Family History  Problem Relation Age of Onset  . Stroke Mother   . Stroke Father   . COPD Brother   . Heart disease Brother   . Diabetes Brother     Prior to Admission medications   Medication Sig Start Date End Date Taking? Authorizing Provider  aspirin 325 MG tablet Take 325 mg by mouth daily.   Yes Historical Provider, MD  donepezil (ARICEPT) 5 MG tablet Take 5 mg by mouth at bedtime.   Yes Historical Provider, MD  pantoprazole (PROTONIX) 40 MG tablet Take 40 mg by mouth daily.   Yes Historical Provider, MD  sennosides-docusate sodium (SENOKOT-S) 8.6-50 MG tablet Take 2 tablets by mouth daily.   Yes Historical Provider, MD  simvastatin (ZOCOR) 20 MG tablet Take 20 mg by mouth daily.   Yes Historical Provider, MD    Physical Exam: Filed Vitals:   03/02/14 1930 03/02/14 1945 03/02/14 2000 03/02/14 2015  BP: 138/66 121/67 114/62 123/90  Pulse: 45  57 48  Temp:      TempSrc:      Resp: 22 23 22 27   SpO2: 98% 96% 97% 99%    General: Alert, Awake and  Oriented to Person. Appear in mild distress Eyes: PERRL ENT: Oral Mucosa clear moist. Neck: no JVD Cardiovascular: S1 and S2 Present,  aortic systolic Murmur, Peripheral Pulses Present Respiratory: Bilateral Air entry equal and Decreased left more than right,  faintCrackles,  no wheezes Abdomen: Bowel Sound Present, Soft and Non tender Skin: no Rash Extremities: no Pedal edema, no calf tenderness Neurologic: Grossly no focal neuro deficit other than generalized weakness  Labs on Admission:  CBC:  Recent Labs Lab 03/02/14 1810  WBC 9.6  NEUTROABS 6.8  HGB 11.7*  HCT 35.4*  MCV 89.8  PLT 348    CMP     Component  Value Date/Time   NA 133* 03/02/2014 1810   K 4.6 03/02/2014 1810   CL 95* 03/02/2014 1810   CO2 22 03/02/2014 1810   GLUCOSE 89 03/02/2014 1810   BUN 22 03/02/2014 1810   CREATININE 1.10 03/02/2014 1810   CREATININE 1.11 01/19/2013 1023   CALCIUM 9.0 03/02/2014 1810   PROT 7.2 03/02/2014 1810   ALBUMIN 3.1* 03/02/2014 1810   AST 20 03/02/2014 1810   ALT 8 03/02/2014 1810   ALKPHOS 72 03/02/2014 1810   BILITOT 0.5 03/02/2014 1810   GFRNONAA 58* 03/02/2014 1810   GFRNONAA 60 01/19/2013 1023   GFRAA 68* 03/02/2014 1810   GFRAA 69 01/19/2013 1023    No results found for this basename: LIPASE, AMYLASE,  in the last 168 hours No results found for this basename: AMMONIA,  in the last 168 hours   Recent Labs Lab 03/02/14 1810  TROPONINI <0.30   BNP (last 3 results) No results found for this basename: PROBNP,  in the last 8760 hours  Radiological Exams on Admission: Dg Chest 2 View  03/02/2014   CLINICAL DATA:  Difficulty breathing  EXAM: CHEST  2 VIEW  COMPARISON:  March 10, 2007  FINDINGS: There is a loculated effusion with consolidation in the left lower lobe and portion of the lingula inferiorly. Right lung is clear. Heart is upper normal in size with pulmonary vascularity within normal limits. No adenopathy. There is degenerative change in the thoracic spine. There is marked collapse of an upper lumbar vertebral body.  IMPRESSION: Loculated effusion on the left with areas of consolidation in portions of the left lower lobe and lingula. Right lung is clear. Marked collapse of an upper lumbar vertebral body is noted.   Electronically Signed   By: Lowella Grip M.D.   On: 03/02/2014 19:15   Ct Head Wo Contrast  03/02/2014   CLINICAL DATA:  Altered mental status  EXAM: CT HEAD WITHOUT CONTRAST  TECHNIQUE: Contiguous axial images were obtained from the base of the skull through the vertex without intravenous contrast.  COMPARISON:  Head CT 2015 brain MRI  FINDINGS: Chronic cerebral atrophy and ex vacuo  dilatation of the ventricles, stable. Stable left middle cranial fossa arachnoid cyst. Negative for intra or extra-axial hemorrhage, mass effect, mass lesion, or evidence of acute cortically based infarction. The skull is intact. The visualized paranasal sinuses show some mucosal thickening in the right frontal sinus, chronic and mild mucosal thickening in the left frontal sinus. No air-fluid levels. Small chronic right mastoid effusion. Left mastoid air cells are clear. The skull is intact. Soft tissues of the scalp are stable in show no evidence of acute abnormality.  IMPRESSION: No acute intracranial abnormality.  Chronic cerebral atrophy.   Electronically Signed   By: Curlene Dolphin M.D.   On: 03/02/2014 19:02   EKG: Independently  reviewed. normal sinus rhythm, nonspecific ST and T waves changes. Assessment/Plan Principal Problem:   Aspiration pneumonia Active Problems:   Enlarged prostate with urinary retention   Chronic constipation   UTI (lower urinary tract infection)   Alzheimer's disease   CVA (cerebral infarction)   Generalized weakness   1. Aspiration pneumonia  patient is presenting with complaints of acute change in his mental status with choking episodes and dysphagia and a possible episode of vomiting. Chest x-rays showing left-sided effusion with new possible pneumonia. Although he does not have leukocytosis of speech or fever. With this presentation is more likely aspiration pneumonia. Currently I would start him on IV Unasyn which should cover adequately for aspiration pneumonia. Continue with IV hydration and follow cultures.  2. Alzheimer's dementia. Patient does not appear to have any agitation at present. Monitor for delirium.  3. GERD Continue Protonix.  DVT Prophylaxis: subcutaneous Heparin Nutrition:  N.p.o., speech evaluation  Code Status:  DNR/DNI  Family Communication:  Daughter was present at bedside, opportunity was given to ask question and all questions  were answered satisfactorily at the time of interview. Disposition: Admitted to  Inpatient in telemetry unit.  Author: Berle Mull, MD [facility] Pager: [phone] 03/02/2014, 8:44 PM    If 7PM-7AM, please contact night-coverage [email or web address] Password [email or web address]  **Disclaimer: This note may have been dictated with voice recognition software. Similar sounding words can inadvertently be transcribed and this note may contain transcription errors which may not have been corrected upon publication of note.**

## 2014-03-02 NOTE — ED Notes (Signed)
MD Admitting at the bedside.

## 2014-03-02 NOTE — ED Notes (Addendum)
Per EMS, family called for alerted mental status of patient, decreased appetite. Per EMS, patient initial HR 44 with bigeminy. Home health nurse states saw patient Friday, catheter changed, patient ambulatory at that time with some difficulties due to prior stroke. Hx of dementia

## 2014-03-02 NOTE — Consult Note (Signed)
ANTIBIOTIC CONSULT NOTE - INITIAL  Pharmacy Consult for Unasyn Indication: CAP/aspiration pneumonia  No Known Allergies  Patient Measurements: Height: 6' 0.05" (183 cm) Weight: 179 lb 7.3 oz (81.4 kg) IBW/kg (Calculated) : 77.71  Vital Signs: Temp: 100.5 F (38.1 C) (07/14 1822) Temp src: Rectal (07/14 1822) BP: 123/90 mmHg (07/14 2015) Pulse Rate: 48 (07/14 2015) Intake/Output from previous day:   Intake/Output from this shift:    Labs:  Recent Labs  03/02/14 1810  WBC 9.6  HGB 11.7*  PLT 348  CREATININE 1.10   Estimated Creatinine Clearance: 52 ml/min (by C-G formula based on Cr of 1.1).  Microbiology: No results found for this or any previous visit (from the past 720 hour(s)).  Medical History: Past Medical History  Diagnosis Date  . Alzheimer's disease   . Skin cancer   . Chronic constipation   . Stroke     lower extremity deficits   Assessment: 89yom with hx CVA and dementia presents to the ED with altered mental status. CXR shows consolidation in portions of the left lower lobe. He will begin unasyn for probable CAP/aspiration pneumonia. Renal function is stable.   Goal of Therapy:  Appropriate unasyn dosing  Plan:  1) Unasyn 1.5g IV q6 2) Follow renal function, cultures, LOT  Deboraha Sprang 03/02/2014,8:53 PM

## 2014-03-02 NOTE — ED Notes (Signed)
MD Campos at the bedside  

## 2014-03-03 ENCOUNTER — Inpatient Hospital Stay (HOSPITAL_COMMUNITY): Payer: Medicare Other

## 2014-03-03 DIAGNOSIS — J189 Pneumonia, unspecified organism: Secondary | ICD-10-CM

## 2014-03-03 LAB — COMPREHENSIVE METABOLIC PANEL
ALK PHOS: 68 U/L (ref 39–117)
ALT: 7 U/L (ref 0–53)
ANION GAP: 13 (ref 5–15)
AST: 15 U/L (ref 0–37)
Albumin: 3 g/dL — ABNORMAL LOW (ref 3.5–5.2)
BILIRUBIN TOTAL: 0.5 mg/dL (ref 0.3–1.2)
BUN: 18 mg/dL (ref 6–23)
CHLORIDE: 98 meq/L (ref 96–112)
CO2: 25 mEq/L (ref 19–32)
Calcium: 8.7 mg/dL (ref 8.4–10.5)
Creatinine, Ser: 1.02 mg/dL (ref 0.50–1.35)
GFR calc Af Amer: 74 mL/min — ABNORMAL LOW (ref >90)
GFR calc non Af Amer: 64 mL/min — ABNORMAL LOW (ref >90)
Glucose, Bld: 96 mg/dL (ref 70–99)
POTASSIUM: 4.1 meq/L (ref 3.7–5.3)
SODIUM: 136 meq/L — AB (ref 137–147)
TOTAL PROTEIN: 6.8 g/dL (ref 6.0–8.3)

## 2014-03-03 LAB — LACTATE DEHYDROGENASE, PLEURAL OR PERITONEAL FLUID: LD, Fluid: 10 U/L (ref 3–23)

## 2014-03-03 LAB — CBC WITH DIFFERENTIAL/PLATELET
Basophils Absolute: 0 10*3/uL (ref 0.0–0.1)
Basophils Relative: 0 % (ref 0–1)
EOS ABS: 0.3 10*3/uL (ref 0.0–0.7)
Eosinophils Relative: 3 % (ref 0–5)
HCT: 33.6 % — ABNORMAL LOW (ref 39.0–52.0)
HEMOGLOBIN: 11 g/dL — AB (ref 13.0–17.0)
Lymphocytes Relative: 12 % (ref 12–46)
Lymphs Abs: 1.1 10*3/uL (ref 0.7–4.0)
MCH: 29.5 pg (ref 26.0–34.0)
MCHC: 32.7 g/dL (ref 30.0–36.0)
MCV: 90.1 fL (ref 78.0–100.0)
MONOS PCT: 14 % — AB (ref 3–12)
Monocytes Absolute: 1.3 10*3/uL — ABNORMAL HIGH (ref 0.1–1.0)
NEUTROS ABS: 6.2 10*3/uL (ref 1.7–7.7)
NEUTROS PCT: 71 % (ref 43–77)
PLATELETS: 322 10*3/uL (ref 150–400)
RBC: 3.73 MIL/uL — AB (ref 4.22–5.81)
RDW: 14 % (ref 11.5–15.5)
WBC: 8.8 10*3/uL (ref 4.0–10.5)

## 2014-03-03 LAB — BODY FLUID CELL COUNT WITH DIFFERENTIAL
Eos, Fluid: 0 %
Lymphs, Fluid: 78 %
Monocyte-Macrophage-Serous Fluid: 20 % — ABNORMAL LOW (ref 50–90)
Neutrophil Count, Fluid: 2 % (ref 0–25)
WBC FLUID: 571 uL (ref 0–1000)

## 2014-03-03 LAB — GLUCOSE, SEROUS FLUID: Glucose, Fluid: 85 mg/dL

## 2014-03-03 LAB — STREP PNEUMONIAE URINARY ANTIGEN: STREP PNEUMO URINARY ANTIGEN: NEGATIVE

## 2014-03-03 LAB — PROTEIN, BODY FLUID: Total protein, fluid: 4.1 g/dL

## 2014-03-03 LAB — PROTIME-INR
INR: 1.2 (ref 0.00–1.49)
Prothrombin Time: 15.2 seconds (ref 11.6–15.2)

## 2014-03-03 MED ORDER — CHLORHEXIDINE GLUCONATE 0.12 % MT SOLN
15.0000 mL | Freq: Two times a day (BID) | OROMUCOSAL | Status: DC
  Administered 2014-03-03 – 2014-03-05 (×4): 15 mL via OROMUCOSAL
  Filled 2014-03-03 (×6): qty 15

## 2014-03-03 MED ORDER — BIOTENE DRY MOUTH MT LIQD
15.0000 mL | Freq: Two times a day (BID) | OROMUCOSAL | Status: DC
  Administered 2014-03-03 – 2014-03-04 (×2): 15 mL via OROMUCOSAL

## 2014-03-03 NOTE — Progress Notes (Signed)
[facility] PROGRESS NOTE  JARYN ROSKO OEH:[id number] DOB: [birth year] DOA: 03/02/2014 PCP: Odette Fraction, MD  Assessment/Plan:   Aspiration pneumonia  patient is presenting with complaints of acute change in his mental status with choking episodes and dysphagia and a possible episode of vomiting.  Chest x-rays showing left-sided effusion with new possible pneumonia. Although he does not have leukocytosis of speech or fever. With this presentation is more likely aspiration pneumonia.  Currently I would start him on IV Unasyn which should cover adequately for aspiration pneumonia. Continue with IV hydration and follow cultures.   Left Pleural effusion Will get US guided thoracentesis, send the pleural fluid for analysis, LDH, culture, cytology, protein, cell count, glucose.  T2, T7 sclerotic lesions ? Malignancy, discussed with daughter in detail, will not pursue aggressively. Will check pleural fluid cytology to check for malignancy. Patient is DNR. No further work up for malignancy as per daughter.   Alzheimer's dementia.  Patient does not appear to have any agitation at present. Monitor for delirium.    GERD  Continue Protonix.  DVT Prophylaxis: subcutaneous Heparin  Nutrition: N.p.o., speech evaluation   Code Status: DNR Family Communication: Discussed with daughter at bedside Disposition Plan: Home when stable   Consultants:  None  Procedures:  None  Antibiotics:  Unasyn 7/14--  HPI/Subjective: Patent seen and examined, breathing is stable. CXR shows left pleural effusion. CT chest shows sclerotic lesions in the T2 and T7 vertebral bodies. Asymmetry in the right breast.  Objective: Filed Vitals:   03/03/14 1415  BP: 130/66  Pulse:   Temp:   Resp:     Intake/Output Summary (Last 24 hours) at 03/03/14 1653 Last data filed at 03/03/14 0435  Gross per 24 hour  Intake      0 ml  Output    900 ml  Net   -900 ml   Filed Weights   03/02/14  2015 03/02/14 2217 03/03/14 0432  Weight: 81.4 kg (179 lb 7.3 oz) 75.887 kg (167 lb 4.8 oz) 79.969 kg (176 lb 4.8 oz)    Exam:  Physical Exam: Head: Normocephalic, atraumatic.  Neck: supple,No deformities, masses, or tenderness noted. Lungs: Normal respiratory effort. Decreased breath sounds on left Heart: Regular RR. S1 and S2 normal  Abdomen: BS normoactive. Soft, Nondistended, non-tender.  Extremities: No pretibial edema, no erythema  Data Reviewed: Basic Metabolic Panel:  Recent Labs Lab 03/02/14 1810 03/03/14 0830  NA 133* 136*  K 4.6 4.1  CL 95* 98  CO2 22 25  GLUCOSE 89 96  BUN 22 18  CREATININE 1.10 1.02  CALCIUM 9.0 8.7   Liver Function Tests:  Recent Labs Lab 03/02/14 1810 03/03/14 0830  AST 20 15  ALT 8 7  ALKPHOS 72 68  BILITOT 0.5 0.5  PROT 7.2 6.8  ALBUMIN 3.1* 3.0*   No results found for this basename: LIPASE, AMYLASE,  in the last 168 hours No results found for this basename: AMMONIA,  in the last 168 hours CBC:  Recent Labs Lab 03/02/14 1810 03/03/14 0830  WBC 9.6 8.8  NEUTROABS 6.8 6.2  HGB 11.7* 11.0*  HCT 35.4* 33.6*  MCV 89.8 90.1  PLT 348 322   Cardiac Enzymes:  Recent Labs Lab 03/02/14 1810  TROPONINI <0.30   BNP (last 3 results) No results found for this basename: PROBNP,  in the last 8760 hours CBG: No results found for this basename: GLUCAP,  in the last 168 hours  Recent Results (from the past 240 hour(s))  CULTURE, BLOOD (ROUTINE X 2)     Status: None   Collection Time    03/02/14  6:10 PM      Result Value Ref Range Status   Specimen Description BLOOD LEFT ARM   Final   Special Requests BOTTLES DRAWN AEROBIC ONLY 10 CC   Final   Culture  Setup Time     Final   Value: 03/02/2014 22:38     Performed at [facility]   Culture     Final   Value:        BLOOD CULTURE RECEIVED NO GROWTH TO DATE CULTURE WILL BE HELD FOR 5 DAYS BEFORE ISSUING A FINAL NEGATIVE REPORT     Performed at [facility]    Report Status PENDING   Incomplete  CULTURE, BLOOD (ROUTINE X 2)     Status: None   Collection Time    03/02/14  6:14 PM      Result Value Ref Range Status   Specimen Description BLOOD RIGHT HAND   Final   Special Requests BOTTLES DRAWN AEROBIC ONLY 5 CC   Final   Culture  Setup Time     Final   Value: 03/02/2014 22:38     Performed at [facility]   Culture     Final   Value:        BLOOD CULTURE RECEIVED NO GROWTH TO DATE CULTURE WILL BE HELD FOR 5 DAYS BEFORE ISSUING A FINAL NEGATIVE REPORT     Performed at [facility]   Report Status PENDING   Incomplete     Studies: Dg Chest 1 View  03/03/2014   CLINICAL DATA:  Left thoracentesis  EXAM: CHEST - 1 VIEW  COMPARISON:  Chest radiograph 03/02/2014  FINDINGS: Normal cardiac silhouette. There is a moderate left pleural effusion which is decreased in volume compared to CT of 03/02/2014. Right lung is clear. Chronic bronchitic markings are noted.  IMPRESSION: Moderate left pleural effusion is mildly decreased from comparison exam. No pneumothorax.   Electronically Signed   By: Suzy Bouchard M.D.   On: 03/03/2014 14:44   Dg Chest 2 View  03/02/2014   CLINICAL DATA:  Difficulty breathing  EXAM: CHEST  2 VIEW  COMPARISON:  March 10, 2007  FINDINGS: There is a loculated effusion with consolidation in the left lower lobe and portion of the lingula inferiorly. Right lung is clear. Heart is upper normal in size with pulmonary vascularity within normal limits. No adenopathy. There is degenerative change in the thoracic spine. There is marked collapse of an upper lumbar vertebral body.  IMPRESSION: Loculated effusion on the left with areas of consolidation in portions of the left lower lobe and lingula. Right lung is clear. Marked collapse of an upper lumbar vertebral body is noted.   Electronically Signed   By: Lowella Grip M.D.   On: 03/02/2014 19:15   Ct Head Wo Contrast  03/02/2014   CLINICAL DATA:  Altered mental status  EXAM:  CT HEAD WITHOUT CONTRAST  TECHNIQUE: Contiguous axial images were obtained from the base of the skull through the vertex without intravenous contrast.  COMPARISON:  Head CT 2015 brain MRI  FINDINGS: Chronic cerebral atrophy and ex vacuo dilatation of the ventricles, stable. Stable left middle cranial fossa arachnoid cyst. Negative for intra or extra-axial hemorrhage, mass effect, mass lesion, or evidence of acute cortically based infarction. The skull is intact. The visualized paranasal sinuses show some mucosal thickening in the right frontal sinus,  chronic and mild mucosal thickening in the left frontal sinus. No air-fluid levels. Small chronic right mastoid effusion. Left mastoid air cells are clear. The skull is intact. Soft tissues of the scalp are stable in show no evidence of acute abnormality.  IMPRESSION: No acute intracranial abnormality.  Chronic cerebral atrophy.   Electronically Signed   By: Curlene Dolphin M.D.   On: 03/02/2014 19:02   Ct Chest W Contrast  03/02/2014   CLINICAL DATA:  Altered mental status; pleural effusion  EXAM: CT CHEST WITH CONTRAST  TECHNIQUE: Multidetector CT imaging of the chest was performed during intravenous contrast administration.  CONTRAST:  112mL OMNIPAQUE IOHEXOL 300 MG/ML  SOLN  COMPARISON:  Chest radiograph March 02, 2014  FINDINGS: There is a large apparently free-flowing left pleural effusion with left lower lobe consolidation. The right lung is clear except for mild right base atelectatic change.  There is no appreciable thoracic adenopathy. The pericardium is not thickened. There are multiple foci of coronary artery calcification. There is atherosclerotic change in the aorta without aneurysm or dissection. No pulmonary embolus is appreciable on this study.  In the visualized upper abdomen, there is cholelithiasis. There is fatty liver.  There are sclerotic lesions in the T2 and T7 vertebral bodies within appearance suggesting neoplasia.  Thyroid appears normal.  There is asymmetric opacity in the mid right breast.  IMPRESSION: Sclerotic lesions in the T2 and T7 vertebral bodies. Neoplastic etiology is suspected. Statistically, breast carcinoma would be the most likely etiology for sclerotic bone metastases. Note that there is an area of asymmetry in the right breast. Mammographic correlation advised.  Sizable left effusion with left lower lobe consolidation.  Multifocal atherosclerotic change.  Cholelithiasis.  Fatty liver.  No appreciable pulmonary embolus.   Electronically Signed   By: Lowella Grip M.D.   On: 03/02/2014 21:43   Dg Swallowing Func-speech Pathology  03/03/2014   Amy Dionicia Abler, CCC-SLP     03/03/2014  3:28 PM Objective Swallowing Evaluation: Modified Barium Swallowing Study   Patient Details  Name: Matthew Hodge MRN: [id number] Date of Birth: Aug 29, 1924  Today's Date: 03/03/2014 Time: 1335-1400 SLP Time Calculation (min): 25 min  Past Medical History:  Past Medical History  Diagnosis Date  . Alzheimer's disease   . Skin cancer   . Chronic constipation   . Stroke     lower extremity deficits   Past Surgical History:  Past Surgical History  Procedure Laterality Date  . Hand tendon surgery    . Cataract extraction, bilateral    . Skin cancer removal forehead    . Green light laser turp (transurethral resection of prostate   08/11/2012    Procedure: GREEN LIGHT LASER TURP (TRANSURETHRAL RESECTION OF  PROSTATE;  Surgeon: Ailene Rud, MD;  Location: [facility];   Service: Urology;  Laterality: N/A;  Julianne Rice Laser of  Prostate  . Insertion of suprapubic catheter  08/11/2012    Procedure: INSERTION OF SUPRAPUBIC CATHETER;  Surgeon: Ailene Rud, MD;  Location: [facility];  Service: Urology;   Laterality: N/A;  . Circumcision  08/11/2012    Procedure: CIRCUMCISION ADULT;  Surgeon: Ailene Rud,  MD;  Location: [facility];  Service: Urology;  Laterality: N/A;   DORSAL SLIT CIRCUMCISION  . Cystoscopy  08/11/2012    Procedure: CYSTOSCOPY;   Surgeon: Ailene Rud, MD;   Location: [facility];  Service: Urology;  Laterality: N/A;   HPI:  80 y.o. male with past medical history of  Alzheimer's dementia,  CVA, dysphagia admitted with worsening confusion. Decreased  appetite and oral intake. Episode of vomiting with cough and past  3 days has been having progressively choking episodes.  CT chest  7/14 sizable left effusion with left lower lobe consolidation.  CXR 03/02/14 Loculated effusion on the left with areas of  consolidation in portions of the left lower lobe and lingula.  Right lung is clear. Marked collapse of an upper lumbar vertebral  body is noted.      Assessment / Plan / Recommendation Clinical Impression  Dysphagia Diagnosis: Within Functional Limits Clinical impression: Pt demonstrated a oropharyngeal swallow  essentially within functional limits across consistencies- no  penetration/ aspiration noted. Mild delay in swallow trigger at  the level of the valleculae with puree consistency- not abnormal  for pt's age; also minimal amounts of valleculae residue across  consistencies. Given these findings pt risk of aspiration is low;  risk may increase, however, with larger sips or bites as  residuals may increase. Recommend initiating regular diet, thin  liquids with no straws, meds whole with liquid or with puree if  difficulties arise. Provide intermittent supervision to cue pt to  take small sips/ bites at a time. ST will sign off at this time.  Please re-consult if difficulties arise.    Treatment Recommendation  No treatment recommended at this time    Diet Recommendation Regular;Thin liquid   Liquid Administration via: Cup;No straw Medication Administration: Whole meds with liquid Supervision: Patient able to self feed;Intermittent supervision  to cue for compensatory strategies Compensations: Slow rate;Small sips/bites Postural Changes and/or Swallow Maneuvers: Seated upright 90  degrees    Other  Recommendations Oral Care Recommendations:  Oral care BID   Follow Up Recommendations  None    Frequency and Duration        Pertinent Vitals/Pain n/a    SLP Swallow Goals     General HPI: 80 y.o. male with past medical history of  Alzheimer's dementia, CVA, dysphagia admitted with worsening  confusion. Decreased appetite and oral intake. Episode of  vomiting with cough and past 3 days has been having progressively  choking episodes.  CT chest 7/14 sizable left effusion with left  lower lobe consolidation. CXR 03/02/14 Loculated effusion on the  left with areas of consolidation in portions of the left lower  lobe and lingula. Right lung is clear. Marked collapse of an  upper lumbar vertebral body is noted.  Type of Study: Modified Barium Swallowing Study Reason for Referral: Objectively evaluate swallowing function Previous Swallow Assessment: BSE this a.m.- concerns for  development of PNA Diet Prior to this Study: NPO Temperature Spikes Noted: Yes Respiratory Status: Room air History of Recent Intubation: No Behavior/Cognition: Alert;Cooperative;Pleasant mood;Requires  cueing;Hard of hearing Oral Cavity - Dentition: Dentures, top;Dentures, bottom Self-Feeding Abilities: Able to feed self Patient Positioning: Upright in chair Baseline Vocal Quality: Clear Anatomy: Within functional limits Pharyngeal Secretions: Not observed secondary MBS    Reason for Referral Objectively evaluate swallowing function   Oral Phase Oral Preparation/Oral Phase Oral Phase: WFL Oral - Solids Oral - Pill: Reduced posterior propulsion   Pharyngeal Phase Pharyngeal Phase Pharyngeal Phase: Impaired Pharyngeal - Thin Pharyngeal - Thin Cup: Pharyngeal residue - valleculae Pharyngeal - Thin Straw: Pharyngeal residue - valleculae Pharyngeal - Solids Pharyngeal - Puree: Delayed swallow initiation;Pharyngeal residue  - valleculae Pharyngeal - Regular: Pharyngeal residue - valleculae Pharyngeal - Pill: Within functional limits  Cervical Esophageal Phase    GO    Cervical Esophageal  Phase  Cervical Esophageal Phase: Dorna Mai, Amy K, MA, CCC-SLP 03/03/2014, 3:26 PM    US Thoracentesis Asp Pleural Space W/img Guide  03/03/2014   CLINICAL DATA:  Left pleural effusion  EXAM: ULTRASOUND GUIDED left foot THORACENTESIS  COMPARISON:  None  PROCEDURE: An ultrasound guided thoracentesis was thoroughly discussed with the patient and questions answered. The benefits, risks, alternatives and complications were also discussed. The patient understands and wishes to proceed with the procedure. Written consent was obtained.  Ultrasound was performed to localize and mark an adequate pocket of fluid in the left chest. The area was then prepped and draped in the normal sterile fashion. 1% Lidocaine was used for local anesthesia. Under ultrasound guidance a 19 gauge Yueh catheter was introduced. Thoracentesis was performed. The catheter was removed and a dressing applied.  Complications:  None  FINDINGS: A total of approximately 1 L of yellow fluid was removed. A fluid sample wassent for laboratory analysis.  IMPRESSION: Successful ultrasound guided left thoracentesis yielding 1 L of pleural fluid.  Read by: Moshe Cipro   Electronically Signed   By: Aletta Edouard M.D.   On: 03/03/2014 14:27    Scheduled Meds: . ampicillin-sulbactam (UNASYN) IV  1.5 g Intravenous Q6H  . antiseptic oral rinse  15 mL Mouth Rinse q12n4p  . aspirin  325 mg Oral Daily  . chlorhexidine  15 mL Mouth Rinse BID  . donepezil  5 mg Oral QHS  . enoxaparin (LOVENOX) injection  40 mg Subcutaneous Q24H  . pantoprazole  40 mg Oral Daily  . senna-docusate  2 tablet Oral QHS  . simvastatin  20 mg Oral Daily   Continuous Infusions: . sodium chloride 100 mL/hr at 03/03/14 1546    Principal Problem:   Aspiration pneumonia Active Problems:   Enlarged prostate with urinary retention   Chronic constipation   UTI (lower urinary tract infection)   Alzheimer's disease   CVA (cerebral  infarction)   Generalized weakness    Time spent: 25 min    [facility]  [facility] Pager [phone]*. If 7PM-7AM, please contact night-coverage at [email or web address], password [facility] 03/03/2014, 4:53 PM  LOS: 1 day

## 2014-03-03 NOTE — Progress Notes (Signed)
Utilization review completed.  

## 2014-03-03 NOTE — Procedures (Signed)
  US guided L thora 1 liter yellow fluid Sent for labs per MD  Pt tolerated well  cxr pending

## 2014-03-03 NOTE — Progress Notes (Signed)
Pt arrived to unit alert and oriented person. Oriented to room, unit, and staff.  Bed in lowest position and call bell is within reach. Will continue to monitor.

## 2014-03-03 NOTE — Evaluation (Signed)
Clinical/Bedside Swallow Evaluation Patient Details  Name: Matthew Hodge MRN: [id number] Date of Birth: 11/07/1924  Today's Date: 03/03/2014 Time: 8101-7510 SLP Time Calculation (min): 23 min  Past Medical History:  Past Medical History  Diagnosis Date  . Alzheimer's disease   . Skin cancer   . Chronic constipation   . Stroke     lower extremity deficits   Past Surgical History:  Past Surgical History  Procedure Laterality Date  . Hand tendon surgery    . Cataract extraction, bilateral    . Skin cancer removal forehead    . Green light laser turp (transurethral resection of prostate  08/11/2012    Procedure: GREEN LIGHT LASER TURP (TRANSURETHRAL RESECTION OF PROSTATE;  Surgeon: Ailene Rud, MD;  Location: [facility];  Service: Urology;  Laterality: N/A;  Julianne Rice Laser of Prostate  . Insertion of suprapubic catheter  08/11/2012    Procedure: INSERTION OF SUPRAPUBIC CATHETER;  Surgeon: Ailene Rud, MD;  Location: [facility];  Service: Urology;  Laterality: N/A;  . Circumcision  08/11/2012    Procedure: CIRCUMCISION ADULT;  Surgeon: Ailene Rud, MD;  Location: [facility];  Service: Urology;  Laterality: N/A;  DORSAL SLIT CIRCUMCISION  . Cystoscopy  08/11/2012    Procedure: CYSTOSCOPY;  Surgeon: Ailene Rud, MD;  Location: [facility];  Service: Urology;  Laterality: N/A;   HPI:  80 y.o. male with past medical history of Alzheimer's dementia, CVA, dysphagia admitted with worsening confusion. Decreased appetite and oral intake. Episode of vomiting with cough and past 3 days has been having progressively choking episodes.  CT chest 7/14 sizable left effusion with left lower lobe consolidation. CXR 03/02/14 Loculated effusion on the left with areas of consolidation in portions of the left lower lobe and lingula. Right lung is clear. Marked collapse of an upper lumbar vertebral body is noted.    Assessment / Plan / Recommendation Clinical Impression  Swallow  ability appeared functional during observation however unable to determine complete safety with po's.  Recommend objective assessment with MBS given current pna, CVA 08/2013 and subjecive information re: coughing intermittently with food/liquid prior to admission.  Pt.'s daughter educated on results of bedside swallow evaluation, clinical reasoning for decision making and recommendation for MBS.  MBS scheduled for today at 1300.    Aspiration Risk   (mild-mod)    Diet Recommendation NPO (NPO until after MBS)        Other  Recommendations Recommended Consults: MBS Oral Care Recommendations: Oral care BID   Follow Up Recommendations   (TBF)    Frequency and Duration        Pertinent Vitals/Pain WDL         Swallow Study         Oral/Motor/Sensory Function Overall Oral Motor/Sensory Function: Impaired Labial ROM: Reduced left Labial Symmetry: Abnormal symmetry left Labial Strength: Reduced Lingual ROM: Within Functional Limits Lingual Symmetry: Within Functional Limits Lingual Strength: Within Functional Limits Facial ROM: Within Functional Limits Facial Symmetry: Within Functional Limits Facial Strength: Within Functional Limits Velum: Within Functional Limits Mandible: Within Functional Limits   Ice Chips Ice chips: Not tested   Thin Liquid Thin Liquid: Within functional limits Presentation: Cup;Straw    Nectar Thick Nectar Thick Liquid: Not tested   Honey Thick Honey Thick Liquid: Not tested   Puree Puree: Within functional limits   Solid       Solid: Not tested       Houston Siren M.Ed [employer] [phone]  03/03/2014    

## 2014-03-03 NOTE — Procedures (Addendum)
Objective Swallowing Evaluation: Modified Barium Swallowing Study  Patient Details  Name: Matthew Hodge MRN: [id number] Date of Birth: 12/02/1924  Today's Date: 03/03/2014 Time: 1335-1400 SLP Time Calculation (min): 25 min  Past Medical History:  Past Medical History  Diagnosis Date  . Alzheimer's disease   . Skin cancer   . Chronic constipation   . Stroke     lower extremity deficits   Past Surgical History:  Past Surgical History  Procedure Laterality Date  . Hand tendon surgery    . Cataract extraction, bilateral    . Skin cancer removal forehead    . Green light laser turp (transurethral resection of prostate  08/11/2012    Procedure: GREEN LIGHT LASER TURP (TRANSURETHRAL RESECTION OF PROSTATE;  Surgeon: Ailene Rud, MD;  Location: [facility];  Service: Urology;  Laterality: N/A;  Julianne Rice Laser of Prostate  . Insertion of suprapubic catheter  08/11/2012    Procedure: INSERTION OF SUPRAPUBIC CATHETER;  Surgeon: Ailene Rud, MD;  Location: [facility];  Service: Urology;  Laterality: N/A;  . Circumcision  08/11/2012    Procedure: CIRCUMCISION ADULT;  Surgeon: Ailene Rud, MD;  Location: [facility];  Service: Urology;  Laterality: N/A;  DORSAL SLIT CIRCUMCISION  . Cystoscopy  08/11/2012    Procedure: CYSTOSCOPY;  Surgeon: Ailene Rud, MD;  Location: [facility];  Service: Urology;  Laterality: N/A;   HPI:  80 y.o. male with past medical history of Alzheimer's dementia, CVA, dysphagia admitted with worsening confusion. Decreased appetite and oral intake. Episode of vomiting with cough and past 3 days has been having progressively choking episodes.  CT chest 7/14 sizable left effusion with left lower lobe consolidation. CXR 03/02/14 Loculated effusion on the left with areas of consolidation in portions of the left lower lobe and lingula. Right lung is clear. Marked collapse of an upper lumbar vertebral body is noted.      Assessment / Plan /  Recommendation Clinical Impression  Dysphagia Diagnosis: Within Functional Limits Clinical impression: Pt demonstrated a oropharyngeal swallow essentially within functional limits across consistencies- no penetration/ aspiration noted. Mild delay in swallow trigger at the level of the valleculae with puree consistency- not abnormal for pt's age; also minimal amounts of valleculae residue across consistencies. Given these findings pt risk of aspiration is low; risk may increase, however, with larger sips or bites as residuals may increase. Recommend initiating regular diet, thin liquids with no straws, meds whole with liquid or with puree if difficulties arise. Provide intermittent supervision to cue pt to take small sips/ bites at a time. ST will sign off at this time. Please re-consult if difficulties arise.    Treatment Recommendation  No treatment recommended at this time    Diet Recommendation Regular;Thin liquid   Liquid Administration via: Cup;No straw Medication Administration: Whole meds with liquid Supervision: Patient able to self feed;Intermittent supervision to cue for compensatory strategies Compensations: Slow rate;Small sips/bites Postural Changes and/or Swallow Maneuvers: Seated upright 90 degrees    Other  Recommendations Oral Care Recommendations: Oral care BID   Follow Up Recommendations  None    Frequency and Duration        Pertinent Vitals/Pain n/a    SLP Swallow Goals     General HPI: 80 y.o. male with past medical history of Alzheimer's dementia, CVA, dysphagia admitted with worsening confusion. Decreased appetite and oral intake. Episode of vomiting with cough and past 3 days has been having progressively choking episodes.  CT chest 7/14 sizable  left effusion with left lower lobe consolidation. CXR 03/02/14 Loculated effusion on the left with areas of consolidation in portions of the left lower lobe and lingula. Right lung is clear. Marked collapse of an upper  lumbar vertebral body is noted.  Type of Study: Modified Barium Swallowing Study Reason for Referral: Objectively evaluate swallowing function Previous Swallow Assessment: BSE this a.m.- concerns for development of PNA Diet Prior to this Study: NPO Temperature Spikes Noted: Yes Respiratory Status: Room air History of Recent Intubation: No Behavior/Cognition: Alert;Cooperative;Pleasant mood;Requires cueing;Hard of hearing Oral Cavity - Dentition: Dentures, top;Dentures, bottom Self-Feeding Abilities: Able to feed self Patient Positioning: Upright in chair Baseline Vocal Quality: Clear Anatomy: Within functional limits Pharyngeal Secretions: Not observed secondary MBS    Reason for Referral Objectively evaluate swallowing function   Oral Phase Oral Preparation/Oral Phase Oral Phase: WFL Oral - Solids Oral - Pill: Reduced posterior propulsion   Pharyngeal Phase Pharyngeal Phase Pharyngeal Phase: Impaired Pharyngeal - Thin Pharyngeal - Thin Cup: Pharyngeal residue - valleculae Pharyngeal - Thin Straw: Pharyngeal residue - valleculae Pharyngeal - Solids Pharyngeal - Puree: Delayed swallow initiation;Pharyngeal residue - valleculae Pharyngeal - Regular: Pharyngeal residue - valleculae Pharyngeal - Pill: Within functional limits  Cervical Esophageal Phase    GO    Cervical Esophageal Phase Cervical Esophageal Phase: Darrold Junker, MA, CCC-SLP 03/03/2014, 3:26 PM

## 2014-03-04 ENCOUNTER — Other Ambulatory Visit: Payer: Self-pay

## 2014-03-04 DIAGNOSIS — J90 Pleural effusion, not elsewhere classified: Secondary | ICD-10-CM

## 2014-03-04 LAB — LEGIONELLA ANTIGEN, URINE: Legionella Antigen, Urine: NEGATIVE

## 2014-03-04 LAB — BASIC METABOLIC PANEL
ANION GAP: 15 (ref 5–15)
BUN: 17 mg/dL (ref 6–23)
CHLORIDE: 98 meq/L (ref 96–112)
CO2: 21 mEq/L (ref 19–32)
CREATININE: 0.97 mg/dL (ref 0.50–1.35)
Calcium: 8.1 mg/dL — ABNORMAL LOW (ref 8.4–10.5)
GFR calc non Af Amer: 72 mL/min — ABNORMAL LOW (ref >90)
GFR, EST AFRICAN AMERICAN: 84 mL/min — AB (ref >90)
Glucose, Bld: 124 mg/dL — ABNORMAL HIGH (ref 70–99)
Potassium: 3.9 mEq/L (ref 3.7–5.3)
SODIUM: 134 meq/L — AB (ref 137–147)

## 2014-03-04 LAB — AMYLASE, PLEURAL FLUID: AMYLASE, PLEURAL FLUID: 17 U/L

## 2014-03-04 NOTE — Plan of Care (Signed)
Problem: Phase I Progression Outcomes Goal: Voiding-avoid urinary catheter unless indicated Outcome: Not Met (add Reason) Patient has suprapubic catheter.     

## 2014-03-04 NOTE — Progress Notes (Signed)
[facility] PROGRESS NOTE  KAYE MITRO RSW:[id number] DOB: July 09, 1925 DOA: 03/02/2014 PCP: Odette Fraction, MD  Assessment/Plan:  Aspiration pneumonia  patient is presenting with complaints of acute change in his mental status with choking episodes and dysphagia and a possible episode of vomiting.  Chest x-rays showing left-sided effusion with new possible pneumonia. Although he does not have leukocytosis of speech or fever. With this presentation is more likely aspiration pneumonia.  Currently I would start him on IV Unasyn which should cover adequately for aspiration pneumonia. Continue with IV hydration and follow cultures.   Left Pleural effusion S/p thoracentesis, removed one liter of pleural fluid. Studies are so far negative for exudate.   T2, T7 sclerotic lesions ? Malignancy, discussed with daughter in detail, will not pursue aggressively. Will check pleural fluid cytology to check for malignancy. Patient is DNR. No further work up for malignancy as per daughter.   Alzheimer's dementia.  Patient does not appear to have any agitation at present. Monitor for delirium.   GERD  Continue Protonix.  DVT Prophylaxis: subcutaneous Heparin  Nutrition: N.p.o., speech evaluation   Code Status: DNR Family Communication: Discussed with daughter at bedside Disposition Plan: Home when stable   Consultants:  None  Procedures:  None  Antibiotics:  Unasyn 7/14--  HPI/Subjective: Patent seen and examined, breathing is stable. CXR shows left pleural effusion. CT chest shows sclerotic lesions in the T2 and T7 vertebral bodies. Asymmetry in the right breast. Patient is breathing better.  Objective: Filed Vitals:   03/04/14 0426  BP: 138/75  Pulse: 82  Temp: 99.1 F (37.3 C)  Resp: 18    Intake/Output Summary (Last 24 hours) at 03/04/14 1115 Last data filed at 03/04/14 0900  Gross per 24 hour  Intake    240 ml  Output   1125 ml  Net   -885 ml   Filed  Weights   03/02/14 2217 03/03/14 0432 03/04/14 0426  Weight: 75.887 kg (167 lb 4.8 oz) 79.969 kg (176 lb 4.8 oz) 79 kg (174 lb 2.6 oz)    Exam:  Physical Exam: Head: Normocephalic, atraumatic.  Neck: supple,No deformities, masses, or tenderness noted. Lungs: Normal respiratory effort. Clear to auscultation bilaterally Heart: Regular RR. S1 and S2 normal  Abdomen: BS normoactive. Soft, Nondistended, non-tender.  Extremities: No pretibial edema, no erythema  Data Reviewed: Basic Metabolic Panel:  Recent Labs Lab 03/02/14 1810 03/03/14 0830  NA 133* 136*  K 4.6 4.1  CL 95* 98  CO2 22 25  GLUCOSE 89 96  BUN 22 18  CREATININE 1.10 1.02  CALCIUM 9.0 8.7   Liver Function Tests:  Recent Labs Lab 03/02/14 1810 03/03/14 0830  AST 20 15  ALT 8 7  ALKPHOS 72 68  BILITOT 0.5 0.5  PROT 7.2 6.8  ALBUMIN 3.1* 3.0*   No results found for this basename: LIPASE, AMYLASE,  in the last 168 hours No results found for this basename: AMMONIA,  in the last 168 hours CBC:  Recent Labs Lab 03/02/14 1810 03/03/14 0830  WBC 9.6 8.8  NEUTROABS 6.8 6.2  HGB 11.7* 11.0*  HCT 35.4* 33.6*  MCV 89.8 90.1  PLT 348 322   Cardiac Enzymes:  Recent Labs Lab 03/02/14 1810  TROPONINI <0.30   BNP (last 3 results) No results found for this basename: PROBNP,  in the last 8760 hours CBG: No results found for this basename: GLUCAP,  in the last 168 hours  Recent Results (from the past 240 hour(s))  CULTURE,  BLOOD (ROUTINE X 2)     Status: None   Collection Time    03/02/14  6:10 PM      Result Value Ref Range Status   Specimen Description BLOOD LEFT ARM   Final   Special Requests BOTTLES DRAWN AEROBIC ONLY 10 CC   Final   Culture  Setup Time     Final   Value: 03/02/2014 22:38     Performed at [facility]   Culture     Final   Value:        BLOOD CULTURE RECEIVED NO GROWTH TO DATE CULTURE WILL BE HELD FOR 5 DAYS BEFORE ISSUING A FINAL NEGATIVE REPORT     Performed at  [facility]   Report Status PENDING   Incomplete  CULTURE, BLOOD (ROUTINE X 2)     Status: None   Collection Time    03/02/14  6:14 PM      Result Value Ref Range Status   Specimen Description BLOOD RIGHT HAND   Final   Special Requests BOTTLES DRAWN AEROBIC ONLY 5 CC   Final   Culture  Setup Time     Final   Value: 03/02/2014 22:38     Performed at [facility]   Culture     Final   Value:        BLOOD CULTURE RECEIVED NO GROWTH TO DATE CULTURE WILL BE HELD FOR 5 DAYS BEFORE ISSUING A FINAL NEGATIVE REPORT     Performed at [facility]   Report Status PENDING   Incomplete  URINE CULTURE     Status: None   Collection Time    03/02/14  7:20 PM      Result Value Ref Range Status   Specimen Description URINE, CATHETERIZED   Final   Special Requests NONE   Final   Culture  Setup Time     Final   Value: 03/02/2014 20:50     Performed at [facility] Count     Final   Value: >=100,000 COLONIES/ML     Performed at [facility]   Culture     Final   Value: [place]     Performed at [facility]   Report Status PENDING   Incomplete  BODY FLUID CULTURE     Status: None   Collection Time    03/03/14  2:29 PM      Result Value Ref Range Status   Specimen Description PLEURAL FLUID LEFT   Final   Special Requests 60ML FLUID   Final   Gram Stain     Final   Value: NO WBC SEEN     NO ORGANISMS SEEN     Performed at [facility]   Culture     Final   Value: NO GROWTH 1 DAY     Performed at [facility]   Report Status PENDING   Incomplete     Studies: Dg Chest 1 View  03/03/2014   CLINICAL DATA:  Left thoracentesis  EXAM: CHEST - 1 VIEW  COMPARISON:  Chest radiograph 03/02/2014  FINDINGS: Normal cardiac silhouette. There is a moderate left pleural effusion which is decreased in volume compared to CT of 03/02/2014. Right lung is clear. Chronic bronchitic markings are noted.  IMPRESSION: Moderate left  pleural effusion is mildly decreased from comparison exam. No pneumothorax.   Electronically Signed   By: Suzy Bouchard M.D.   On:  03/03/2014 14:44   Dg Chest 2 View  03/02/2014   CLINICAL DATA:  Difficulty breathing  EXAM: CHEST  2 VIEW  COMPARISON:  March 10, 2007  FINDINGS: There is a loculated effusion with consolidation in the left lower lobe and portion of the lingula inferiorly. Right lung is clear. Heart is upper normal in size with pulmonary vascularity within normal limits. No adenopathy. There is degenerative change in the thoracic spine. There is marked collapse of an upper lumbar vertebral body.  IMPRESSION: Loculated effusion on the left with areas of consolidation in portions of the left lower lobe and lingula. Right lung is clear. Marked collapse of an upper lumbar vertebral body is noted.   Electronically Signed   By: Lowella Grip M.D.   On: 03/02/2014 19:15   Ct Head Wo Contrast  03/02/2014   CLINICAL DATA:  Altered mental status  EXAM: CT HEAD WITHOUT CONTRAST  TECHNIQUE: Contiguous axial images were obtained from the base of the skull through the vertex without intravenous contrast.  COMPARISON:  Head CT 2015 brain MRI  FINDINGS: Chronic cerebral atrophy and ex vacuo dilatation of the ventricles, stable. Stable left middle cranial fossa arachnoid cyst. Negative for intra or extra-axial hemorrhage, mass effect, mass lesion, or evidence of acute cortically based infarction. The skull is intact. The visualized paranasal sinuses show some mucosal thickening in the right frontal sinus, chronic and mild mucosal thickening in the left frontal sinus. No air-fluid levels. Small chronic right mastoid effusion. Left mastoid air cells are clear. The skull is intact. Soft tissues of the scalp are stable in show no evidence of acute abnormality.  IMPRESSION: No acute intracranial abnormality.  Chronic cerebral atrophy.   Electronically Signed   By: Curlene Dolphin M.D.   On: 03/02/2014 19:02   Ct  Chest W Contrast  03/02/2014   CLINICAL DATA:  Altered mental status; pleural effusion  EXAM: CT CHEST WITH CONTRAST  TECHNIQUE: Multidetector CT imaging of the chest was performed during intravenous contrast administration.  CONTRAST:  115mL OMNIPAQUE IOHEXOL 300 MG/ML  SOLN  COMPARISON:  Chest radiograph March 02, 2014  FINDINGS: There is a large apparently free-flowing left pleural effusion with left lower lobe consolidation. The right lung is clear except for mild right base atelectatic change.  There is no appreciable thoracic adenopathy. The pericardium is not thickened. There are multiple foci of coronary artery calcification. There is atherosclerotic change in the aorta without aneurysm or dissection. No pulmonary embolus is appreciable on this study.  In the visualized upper abdomen, there is cholelithiasis. There is fatty liver.  There are sclerotic lesions in the T2 and T7 vertebral bodies within appearance suggesting neoplasia.  Thyroid appears normal. There is asymmetric opacity in the mid right breast.  IMPRESSION: Sclerotic lesions in the T2 and T7 vertebral bodies. Neoplastic etiology is suspected. Statistically, breast carcinoma would be the most likely etiology for sclerotic bone metastases. Note that there is an area of asymmetry in the right breast. Mammographic correlation advised.  Sizable left effusion with left lower lobe consolidation.  Multifocal atherosclerotic change.  Cholelithiasis.  Fatty liver.  No appreciable pulmonary embolus.   Electronically Signed   By: Lowella Grip M.D.   On: 03/02/2014 21:43   Dg Swallowing Func-speech Pathology  03/03/2014   Amy Dionicia Abler, CCC-SLP     03/03/2014  3:28 PM Objective Swallowing Evaluation: Modified Barium Swallowing Study   Patient Details  Name: HAYLEN SHELNUTT MRN: [id number] Date of Birth: 1925-04-16  Today's  Date: 03/03/2014 Time: 1335-1400 SLP Time Calculation (min): 25 min  Past Medical History:  Past Medical History  Diagnosis Date   . Alzheimer's disease   . Skin cancer   . Chronic constipation   . Stroke     lower extremity deficits   Past Surgical History:  Past Surgical History  Procedure Laterality Date  . Hand tendon surgery    . Cataract extraction, bilateral    . Skin cancer removal forehead    . Green light laser turp (transurethral resection of prostate   08/11/2012    Procedure: GREEN LIGHT LASER TURP (TRANSURETHRAL RESECTION OF  PROSTATE;  Surgeon: Ailene Rud, MD;  Location: [facility];   Service: Urology;  Laterality: N/A;  Julianne Rice Laser of  Prostate  . Insertion of suprapubic catheter  08/11/2012    Procedure: INSERTION OF SUPRAPUBIC CATHETER;  Surgeon: Ailene Rud, MD;  Location: [facility];  Service: Urology;   Laterality: N/A;  . Circumcision  08/11/2012    Procedure: CIRCUMCISION ADULT;  Surgeon: Ailene Rud,  MD;  Location: [facility];  Service: Urology;  Laterality: N/A;   DORSAL SLIT CIRCUMCISION  . Cystoscopy  08/11/2012    Procedure: CYSTOSCOPY;  Surgeon: Ailene Rud, MD;   Location: [facility];  Service: Urology;  Laterality: N/A;   HPI:  80 y.o. male with past medical history of Alzheimer's dementia,  CVA, dysphagia admitted with worsening confusion. Decreased  appetite and oral intake. Episode of vomiting with cough and past  3 days has been having progressively choking episodes.  CT chest  7/14 sizable left effusion with left lower lobe consolidation.  CXR 03/02/14 Loculated effusion on the left with areas of  consolidation in portions of the left lower lobe and lingula.  Right lung is clear. Marked collapse of an upper lumbar vertebral  body is noted.      Assessment / Plan / Recommendation Clinical Impression  Dysphagia Diagnosis: Within Functional Limits Clinical impression: Pt demonstrated a oropharyngeal swallow  essentially within functional limits across consistencies- no  penetration/ aspiration noted. Mild delay in swallow trigger at  the level of the valleculae with puree consistency-  not abnormal  for pt's age; also minimal amounts of valleculae residue across  consistencies. Given these findings pt risk of aspiration is low;  risk may increase, however, with larger sips or bites as  residuals may increase. Recommend initiating regular diet, thin  liquids with no straws, meds whole with liquid or with puree if  difficulties arise. Provide intermittent supervision to cue pt to  take small sips/ bites at a time. ST will sign off at this time.  Please re-consult if difficulties arise.    Treatment Recommendation  No treatment recommended at this time    Diet Recommendation Regular;Thin liquid   Liquid Administration via: Cup;No straw Medication Administration: Whole meds with liquid Supervision: Patient able to self feed;Intermittent supervision  to cue for compensatory strategies Compensations: Slow rate;Small sips/bites Postural Changes and/or Swallow Maneuvers: Seated upright 90  degrees    Other  Recommendations Oral Care Recommendations: Oral care BID   Follow Up Recommendations  None    Frequency and Duration        Pertinent Vitals/Pain n/a    SLP Swallow Goals     General HPI: 80 y.o. male with past medical history of  Alzheimer's dementia, CVA, dysphagia admitted with worsening  confusion. Decreased appetite and oral intake. Episode of  vomiting with cough and past 3 days  has been having progressively  choking episodes.  CT chest 7/14 sizable left effusion with left  lower lobe consolidation. CXR 03/02/14 Loculated effusion on the  left with areas of consolidation in portions of the left lower  lobe and lingula. Right lung is clear. Marked collapse of an  upper lumbar vertebral body is noted.  Type of Study: Modified Barium Swallowing Study Reason for Referral: Objectively evaluate swallowing function Previous Swallow Assessment: BSE this a.m.- concerns for  development of PNA Diet Prior to this Study: NPO Temperature Spikes Noted: Yes Respiratory Status: Room air History of Recent  Intubation: No Behavior/Cognition: Alert;Cooperative;Pleasant mood;Requires  cueing;Hard of hearing Oral Cavity - Dentition: Dentures, top;Dentures, bottom Self-Feeding Abilities: Able to feed self Patient Positioning: Upright in chair Baseline Vocal Quality: Clear Anatomy: Within functional limits Pharyngeal Secretions: Not observed secondary MBS    Reason for Referral Objectively evaluate swallowing function   Oral Phase Oral Preparation/Oral Phase Oral Phase: WFL Oral - Solids Oral - Pill: Reduced posterior propulsion   Pharyngeal Phase Pharyngeal Phase Pharyngeal Phase: Impaired Pharyngeal - Thin Pharyngeal - Thin Cup: Pharyngeal residue - valleculae Pharyngeal - Thin Straw: Pharyngeal residue - valleculae Pharyngeal - Solids Pharyngeal - Puree: Delayed swallow initiation;Pharyngeal residue  - valleculae Pharyngeal - Regular: Pharyngeal residue - valleculae Pharyngeal - Pill: Within functional limits  Cervical Esophageal Phase    GO    Cervical Esophageal Phase Cervical Esophageal Phase: Dorna Mai, Amy K, MA, CCC-SLP 03/03/2014, 3:26 PM    US Thoracentesis Asp Pleural Space W/img Guide  03/03/2014   CLINICAL DATA:  Left pleural effusion  EXAM: ULTRASOUND GUIDED left foot THORACENTESIS  COMPARISON:  None  PROCEDURE: An ultrasound guided thoracentesis was thoroughly discussed with the patient and questions answered. The benefits, risks, alternatives and complications were also discussed. The patient understands and wishes to proceed with the procedure. Written consent was obtained.  Ultrasound was performed to localize and mark an adequate pocket of fluid in the left chest. The area was then prepped and draped in the normal sterile fashion. 1% Lidocaine was used for local anesthesia. Under ultrasound guidance a 19 gauge Yueh catheter was introduced. Thoracentesis was performed. The catheter was removed and a dressing applied.  Complications:  None  FINDINGS: A total of approximately 1 L of yellow  fluid was removed. A fluid sample wassent for laboratory analysis.  IMPRESSION: Successful ultrasound guided left thoracentesis yielding 1 L of pleural fluid.  Read by: Moshe Cipro   Electronically Signed   By: Aletta Edouard M.D.   On: 03/03/2014 14:27    Scheduled Meds: . ampicillin-sulbactam (UNASYN) IV  1.5 g Intravenous Q6H  . antiseptic oral rinse  15 mL Mouth Rinse q12n4p  . aspirin  325 mg Oral Daily  . chlorhexidine  15 mL Mouth Rinse BID  . donepezil  5 mg Oral QHS  . enoxaparin (LOVENOX) injection  40 mg Subcutaneous Q24H  . pantoprazole  40 mg Oral Daily  . senna-docusate  2 tablet Oral QHS  . simvastatin  20 mg Oral Daily   Continuous Infusions: . sodium chloride 100 mL/hr at 03/04/14 0249    Principal Problem:   Aspiration pneumonia Active Problems:   Enlarged prostate with urinary retention   Chronic constipation   UTI (lower urinary tract infection)   Alzheimer's disease   CVA (cerebral infarction)   Generalized weakness    Time spent: 25 min    [facility]  Pager [phone]*. If 7PM-7AM, please contact night-coverage at [email or web address], password [facility] 03/04/2014, 11:15 AM  LOS: 2 days

## 2014-03-04 NOTE — Progress Notes (Signed)
Central telemetry monitoring call around 1 pm and stated pt. Had a short 9 beat run of SVT at 11am this morning.  Pt. Has been mostly SR with bigemy & trigemy.  Pt. Has been asympotmatic sitting up in chair.  Text paged Dr. Darrick Meigs to inform of above.  Will continue to monitor.  Alphonzo Lemmings, RN

## 2014-03-04 NOTE — Progress Notes (Signed)
EKG was done as ordered and text page unconfirmed results of SR with 1st degree HB & frequent PVC's to Dr. Darrick Meigs. Will continue to monitor.  Alphonzo Lemmings, RN

## 2014-03-04 NOTE — Progress Notes (Signed)
Pt. Had another run of questionable accelarated junctional vs a fib around 1500, pt. Remains asymptomatic.  Dr. Darrick Meigs paged and return call with new orders for EKG.  Will continue to monitor and complete new orders.  Alphonzo Lemmings, RN

## 2014-03-05 LAB — URINE CULTURE

## 2014-03-05 MED ORDER — LEVOFLOXACIN 500 MG PO TABS: 500.0000 mg | ORAL_TABLET | Freq: Every day | ORAL | Status: AC

## 2014-03-05 NOTE — Progress Notes (Signed)
Patient discharge teaching given, including activity, diet, follow-up appoints, and medications. Patient verbalized understanding of all discharge instructions. IV access was d/c'd. Vitals are stable. Skin is intact except as charted in most recent assessments. Suprapubic catheter left in place.  Pt escorted out by RN, to be driven home by family. Home health  services arranged, including hospital bed.

## 2014-03-05 NOTE — Care Management Note (Addendum)
    Page 1 of 2   03/05/2014     3:50:41 PM CARE MANAGEMENT NOTE 03/05/2014  Patient:  Matthew Hodge, Matthew Hodge   Account Number:  [id number]  Date Initiated:  03/05/2014  Documentation initiated by:  Tomi Bamberger  Subjective/Objective Assessment:   dx pl eff, sclerotic lesions  admit- lives with spouse.     Action/Plan:   Anticipated DC Date:  03/05/2014   Anticipated DC Plan:  [place]  CM consult      [facility] Choice  Resumption Of Svcs/PTA Provider   Choice offered to / List presented to:  C-4 Adult Children   DME arranged  HOSPITAL BED      DME agency  [place] arranged  HH-1 RN  [place]      [facility] agency  [place]   Status of service:  Completed, signed off Medicare Important Message given?  YES (If response is "NO", the following Medicare IM given date fields will be blank) Date Medicare IM given:  03/05/2014 Medicare IM given by:  Tomi Bamberger Date Additional Medicare IM given:   Additional Medicare IM given by:    Discharge Disposition:  [place]  Per UR Regulation:  Reviewed for med. necessity/level of care/duration of stay  If discussed at [place] of Stay Meetings, dates discussed:    Comments:  03/05/14 Tomi Bamberger RN, BSN [phone] Patient dc today, resume HHRN and HHPt with Sherrin Daisy with [facility] is aware that patient is for dc today.  Also would like to have a hopital bed with [facility], referral made to [facility] with [facility], contact phone number is [phone].

## 2014-03-05 NOTE — Discharge Summary (Addendum)
Physician Discharge Summary  ZYON ROSSER JHE:[id number] DOB: 1925-04-21 DOA: 03/02/2014  PCP: Odette Fraction, MD  Admit date: 03/02/2014 Discharge date: 03/05/2014  Time spent: 69* minutes  Recommendations for Outpatient Follow-up:  1. *Follow up Pulmonary in one week  Discharge Diagnoses:  Principal Problem:   Aspiration pneumonia Active Problems:   Enlarged prostate with urinary retention   Chronic constipation   UTI (lower urinary tract infection)   Alzheimer's disease   CVA (cerebral infarction)   Generalized weakness   Pleural effusion   Discharge Condition: Stable  Diet recommendation: Regular diet  Filed Weights   03/03/14 0432 03/04/14 0426 03/05/14 0516  Weight: 79.969 kg (176 lb 4.8 oz) 79 kg (174 lb 2.6 oz) 83.008 kg (183 lb)    History of present illness:  80 y.o. male with Past medical history of Alzheimer's dementia, CVA, dysphagia.  Patient was brought in with the complaint of worsening confusion. Patient does have her dementia and has mild cognitive imbalance at his baseline. Since last to 3 days he has been more drowsy. And he has decreased appetite and decreased oral intake. He also had decreased urine output in his chronic Foley catheter.  He had an episode of vomiting with cough. Since last to 3 days he has been having progressively choking episodes.  The patient is coming from home. And at his baseline independent for most of his ADL.   Hospital Course:   Community acquired pneumonia patient is presenting with complaints of acute change in his mental status with choking episodes and dysphagia and a possible episode of vomiting.  Chest x-rays showing left-sided effusion with new possible pneumonia. Although he does not have leukocytosis of speech or fever. With this presentation is more likely aspiration pneumonia.  Started on  IV Unasyn which should cover adequately for aspiration pneumonia.  Will discharge on Po Levaquin 500 mg po daily x 10  days  Left Pleural effusion  S/p thoracentesis, removed one liter of pleural fluid. Studies are so far negative for exudate. CXR shows improvement in pleural effusion. He is in no acute distress, not requiring oxygen. Discussed with pulmonary on call, he can follow up with pulmonary clinic on July 23,   T2, T7 sclerotic lesions  ? Malignancy, discussed with daughter in detail, will not pursue aggressively. Will check pleural fluid cytology to check for malignancy. Patient is DNR. No further work up for malignancy as per daughter.   Alzheimer's dementia.  Patient does not appear to have any agitation at present. Monitor for delirium.   GERD  Continue Protonix.   UTI Urine culture grew Klebsiella ozeanae, sensitive to Levaquin Will send home on Po Levaquin x 10 days  Procedures:  Thoracentesis  Consultations:  None  Discharge Exam: Filed Vitals:   03/05/14 0516  BP: 159/73  Pulse: 89  Temp: 98.6 F (37 C)  Resp: 20    General: Appear in no acute distress Cardiovascular: Clear bilaterally Respiratory: S1s2 RRR  Discharge Instructions You were cared for by a hospitalist during your hospital stay. If you have any questions about your discharge medications or the care you received while you were in the hospital after you are discharged, you can call the unit and asked to speak with the hospitalist on call if the hospitalist that took care of you is not available. Once you are discharged, your primary care physician will handle any further medical issues. Please note that NO REFILLS for any discharge medications will be authorized once you are discharged,  as it is imperative that you return to your primary care physician (or establish a relationship with a primary care physician if you do not have one) for your aftercare needs so that they can reassess your need for medications and monitor your lab values.  Discharge Instructions   Diet - low sodium heart healthy    Complete by:   As directed      Increase activity slowly    Complete by:  As directed             Medication List         aspirin 325 MG tablet  Take 325 mg by mouth daily.     donepezil 5 MG tablet  Commonly known as:  ARICEPT  Take 5 mg by mouth at bedtime.     levofloxacin 500 MG tablet  Commonly known as:  LEVAQUIN  Take 1 tablet (500 mg total) by mouth daily.     pantoprazole 40 MG tablet  Commonly known as:  PROTONIX  Take 40 mg by mouth daily.     sennosides-docusate sodium 8.6-50 MG tablet  Commonly known as:  SENOKOT-S  Take 2 tablets by mouth daily.     simvastatin 20 MG tablet  Commonly known as:  ZOCOR  Take 20 mg by mouth daily.       No Known Allergies     Follow-up Information   Follow up with Christinia Gully, MD On 03/11/2014. (at 11:00 AM)    Specialty:  Pulmonary Disease   Contact information:   [address] [place] [address] [phone]        The results of significant diagnostics from this hospitalization (including imaging, microbiology, ancillary and laboratory) are listed below for reference.    Significant Diagnostic Studies: Dg Chest 1 View  03/03/2014   CLINICAL DATA:  Left thoracentesis  EXAM: CHEST - 1 VIEW  COMPARISON:  Chest radiograph 03/02/2014  FINDINGS: Normal cardiac silhouette. There is a moderate left pleural effusion which is decreased in volume compared to CT of 03/02/2014. Right lung is clear. Chronic bronchitic markings are noted.  IMPRESSION: Moderate left pleural effusion is mildly decreased from comparison exam. No pneumothorax.   Electronically Signed   By: Suzy Bouchard M.D.   On: 03/03/2014 14:44   Dg Chest 2 View  03/02/2014   CLINICAL DATA:  Difficulty breathing  EXAM: CHEST  2 VIEW  COMPARISON:  March 10, 2007  FINDINGS: There is a loculated effusion with consolidation in the left lower lobe and portion of the lingula inferiorly. Right lung is clear. Heart is upper normal in size with pulmonary vascularity within  normal limits. No adenopathy. There is degenerative change in the thoracic spine. There is marked collapse of an upper lumbar vertebral body.  IMPRESSION: Loculated effusion on the left with areas of consolidation in portions of the left lower lobe and lingula. Right lung is clear. Marked collapse of an upper lumbar vertebral body is noted.   Electronically Signed   By: Lowella Grip M.D.   On: 03/02/2014 19:15   Ct Head Wo Contrast  03/02/2014   CLINICAL DATA:  Altered mental status  EXAM: CT HEAD WITHOUT CONTRAST  TECHNIQUE: Contiguous axial images were obtained from the base of the skull through the vertex without intravenous contrast.  COMPARISON:  Head CT 2015 brain MRI  FINDINGS: Chronic cerebral atrophy and ex vacuo dilatation of the ventricles, stable. Stable left middle cranial fossa arachnoid cyst. Negative for intra or extra-axial hemorrhage,  mass effect, mass lesion, or evidence of acute cortically based infarction. The skull is intact. The visualized paranasal sinuses show some mucosal thickening in the right frontal sinus, chronic and mild mucosal thickening in the left frontal sinus. No air-fluid levels. Small chronic right mastoid effusion. Left mastoid air cells are clear. The skull is intact. Soft tissues of the scalp are stable in show no evidence of acute abnormality.  IMPRESSION: No acute intracranial abnormality.  Chronic cerebral atrophy.   Electronically Signed   By: Curlene Dolphin M.D.   On: 03/02/2014 19:02   Ct Chest W Contrast  03/02/2014   CLINICAL DATA:  Altered mental status; pleural effusion  EXAM: CT CHEST WITH CONTRAST  TECHNIQUE: Multidetector CT imaging of the chest was performed during intravenous contrast administration.  CONTRAST:  129mL OMNIPAQUE IOHEXOL 300 MG/ML  SOLN  COMPARISON:  Chest radiograph March 02, 2014  FINDINGS: There is a large apparently free-flowing left pleural effusion with left lower lobe consolidation. The right lung is clear except for mild right  base atelectatic change.  There is no appreciable thoracic adenopathy. The pericardium is not thickened. There are multiple foci of coronary artery calcification. There is atherosclerotic change in the aorta without aneurysm or dissection. No pulmonary embolus is appreciable on this study.  In the visualized upper abdomen, there is cholelithiasis. There is fatty liver.  There are sclerotic lesions in the T2 and T7 vertebral bodies within appearance suggesting neoplasia.  Thyroid appears normal. There is asymmetric opacity in the mid right breast.  IMPRESSION: Sclerotic lesions in the T2 and T7 vertebral bodies. Neoplastic etiology is suspected. Statistically, breast carcinoma would be the most likely etiology for sclerotic bone metastases. Note that there is an area of asymmetry in the right breast. Mammographic correlation advised.  Sizable left effusion with left lower lobe consolidation.  Multifocal atherosclerotic change.  Cholelithiasis.  Fatty liver.  No appreciable pulmonary embolus.   Electronically Signed   By: Lowella Grip M.D.   On: 03/02/2014 21:43   Dg Swallowing Func-speech Pathology  03/03/2014   Amy Dionicia Abler, CCC-SLP     03/03/2014  3:28 PM Objective Swallowing Evaluation: Modified Barium Swallowing Study   Patient Details  Name: MAN EFFERTZ MRN: [id number] Date of Birth: Feb 04, 1925  Today's Date: 03/03/2014 Time: 1335-1400 SLP Time Calculation (min): 25 min  Past Medical History:  Past Medical History  Diagnosis Date  . Alzheimer's disease   . Skin cancer   . Chronic constipation   . Stroke     lower extremity deficits   Past Surgical History:  Past Surgical History  Procedure Laterality Date  . Hand tendon surgery    . Cataract extraction, bilateral    . Skin cancer removal forehead    . Green light laser turp (transurethral resection of prostate   08/11/2012    Procedure: GREEN LIGHT LASER TURP (TRANSURETHRAL RESECTION OF  PROSTATE;  Surgeon: Ailene Rud, MD;  Location: [facility]  ORS;   Service: Urology;  Laterality: N/A;  Julianne Rice Laser of  Prostate  . Insertion of suprapubic catheter  08/11/2012    Procedure: INSERTION OF SUPRAPUBIC CATHETER;  Surgeon: Ailene Rud, MD;  Location: [facility] ORS;  Service: Urology;   Laterality: N/A;  . Circumcision  08/11/2012    Procedure: CIRCUMCISION ADULT;  Surgeon: Ailene Rud,  MD;  Location: [facility] ORS;  Service: Urology;  Laterality: N/A;   DORSAL SLIT CIRCUMCISION  . Cystoscopy  08/11/2012    Procedure: CYSTOSCOPY;  Surgeon: Ailene Rud, MD;   Location: [facility] ORS;  Service: Urology;  Laterality: N/A;   HPI:  80 y.o. male with past medical history of Alzheimer's dementia,  CVA, dysphagia admitted with worsening confusion. Decreased  appetite and oral intake. Episode of vomiting with cough and past  3 days has been having progressively choking episodes.  CT chest  7/14 sizable left effusion with left lower lobe consolidation.  CXR 03/02/14 Loculated effusion on the left with areas of  consolidation in portions of the left lower lobe and lingula.  Right lung is clear. Marked collapse of an upper lumbar vertebral  body is noted.      Assessment / Plan / Recommendation Clinical Impression  Dysphagia Diagnosis: Within Functional Limits Clinical impression: Pt demonstrated a oropharyngeal swallow  essentially within functional limits across consistencies- no  penetration/ aspiration noted. Mild delay in swallow trigger at  the level of the valleculae with puree consistency- not abnormal  for pt's age; also minimal amounts of valleculae residue across  consistencies. Given these findings pt risk of aspiration is low;  risk may increase, however, with larger sips or bites as  residuals may increase. Recommend initiating regular diet, thin  liquids with no straws, meds whole with liquid or with puree if  difficulties arise. Provide intermittent supervision to cue pt to  take small sips/ bites at a time. ST will sign off at this time.  Please  re-consult if difficulties arise.    Treatment Recommendation  No treatment recommended at this time    Diet Recommendation Regular;Thin liquid   Liquid Administration via: Cup;No straw Medication Administration: Whole meds with liquid Supervision: Patient able to self feed;Intermittent supervision  to cue for compensatory strategies Compensations: Slow rate;Small sips/bites Postural Changes and/or Swallow Maneuvers: Seated upright 90  degrees    Other  Recommendations Oral Care Recommendations: Oral care BID   Follow Up Recommendations  None    Frequency and Duration        Pertinent Vitals/Pain n/a    SLP Swallow Goals     General HPI: 80 y.o. male with past medical history of  Alzheimer's dementia, CVA, dysphagia admitted with worsening  confusion. Decreased appetite and oral intake. Episode of  vomiting with cough and past 3 days has been having progressively  choking episodes.  CT chest 7/14 sizable left effusion with left  lower lobe consolidation. CXR 03/02/14 Loculated effusion on the  left with areas of consolidation in portions of the left lower  lobe and lingula. Right lung is clear. Marked collapse of an  upper lumbar vertebral body is noted.  Type of Study: Modified Barium Swallowing Study Reason for Referral: Objectively evaluate swallowing function Previous Swallow Assessment: BSE this a.m.- concerns for  development of PNA Diet Prior to this Study: NPO Temperature Spikes Noted: Yes Respiratory Status: Room air History of Recent Intubation: No Behavior/Cognition: Alert;Cooperative;Pleasant mood;Requires  cueing;Hard of hearing Oral Cavity - Dentition: Dentures, top;Dentures, bottom Self-Feeding Abilities: Able to feed self Patient Positioning: Upright in chair Baseline Vocal Quality: Clear Anatomy: Within functional limits Pharyngeal Secretions: Not observed secondary MBS    Reason for Referral Objectively evaluate swallowing function   Oral Phase Oral Preparation/Oral Phase Oral Phase: WFL Oral -  Solids Oral - Pill: Reduced posterior propulsion   Pharyngeal Phase Pharyngeal Phase Pharyngeal Phase: Impaired Pharyngeal - Thin Pharyngeal - Thin Cup: Pharyngeal residue - valleculae Pharyngeal - Thin Straw: Pharyngeal residue - valleculae Pharyngeal - Solids Pharyngeal - Puree: Delayed swallow initiation;Pharyngeal residue  -  valleculae Pharyngeal - Regular: Pharyngeal residue - valleculae Pharyngeal - Pill: Within functional limits  Cervical Esophageal Phase    GO    Cervical Esophageal Phase Cervical Esophageal Phase: Dorna Mai, Amy K, MA, CCC-SLP 03/03/2014, 3:26 PM    US Thoracentesis Asp Pleural Space W/img Guide  03/03/2014   CLINICAL DATA:  Left pleural effusion  EXAM: ULTRASOUND GUIDED left foot THORACENTESIS  COMPARISON:  None  PROCEDURE: An ultrasound guided thoracentesis was thoroughly discussed with the patient and questions answered. The benefits, risks, alternatives and complications were also discussed. The patient understands and wishes to proceed with the procedure. Written consent was obtained.  Ultrasound was performed to localize and mark an adequate pocket of fluid in the left chest. The area was then prepped and draped in the normal sterile fashion. 1% Lidocaine was used for local anesthesia. Under ultrasound guidance a 19 gauge Yueh catheter was introduced. Thoracentesis was performed. The catheter was removed and a dressing applied.  Complications:  None  FINDINGS: A total of approximately 1 L of yellow fluid was removed. A fluid sample wassent for laboratory analysis.  IMPRESSION: Successful ultrasound guided left thoracentesis yielding 1 L of pleural fluid.  Read by: Moshe Cipro   Electronically Signed   By: Aletta Edouard M.D.   On: 03/03/2014 14:27    Microbiology: Recent Results (from the past 240 hour(s))  CULTURE, BLOOD (ROUTINE X 2)     Status: None   Collection Time    03/02/14  6:10 PM      Result Value Ref Range Status    Specimen Description BLOOD LEFT ARM   Final   Special Requests BOTTLES DRAWN AEROBIC ONLY 10 CC   Final   Culture  Setup Time     Final   Value: 03/02/2014 22:38     Performed at [facility]   Culture     Final   Value:        BLOOD CULTURE RECEIVED NO GROWTH TO DATE CULTURE WILL BE HELD FOR 5 DAYS BEFORE ISSUING A FINAL NEGATIVE REPORT     Performed at [facility]   Report Status PENDING   Incomplete  CULTURE, BLOOD (ROUTINE X 2)     Status: None   Collection Time    03/02/14  6:14 PM      Result Value Ref Range Status   Specimen Description BLOOD RIGHT HAND   Final   Special Requests BOTTLES DRAWN AEROBIC ONLY 5 CC   Final   Culture  Setup Time     Final   Value: 03/02/2014 22:38     Performed at [facility]   Culture     Final   Value:        BLOOD CULTURE RECEIVED NO GROWTH TO DATE CULTURE WILL BE HELD FOR 5 DAYS BEFORE ISSUING A FINAL NEGATIVE REPORT     Performed at [facility]   Report Status PENDING   Incomplete  URINE CULTURE     Status: None   Collection Time    03/02/14  7:20 PM      Result Value Ref Range Status   Specimen Description URINE, CATHETERIZED   Final   Special Requests NONE   Final   Culture  Setup Time     Final   Value: 03/02/2014 20:50     Performed at [facility]     Final  Value: >=100,000 COLONIES/ML     Performed at [facility]   Culture     Final   Value: [place]     Performed at [facility]   Report Status 03/05/2014 FINAL   Final   Organism ID, Bacteria KLEBSIELLA OZAENAE   Final  BODY FLUID CULTURE     Status: None   Collection Time    03/03/14  2:29 PM      Result Value Ref Range Status   Specimen Description PLEURAL FLUID LEFT   Final   Special Requests 60ML FLUID   Final   Gram Stain     Final   Value: NO WBC SEEN     NO ORGANISMS SEEN     Performed at [facility]   Culture     Final   Value: NO GROWTH 2 DAYS     Performed at  [facility]   Report Status PENDING   Incomplete     Labs: Basic Metabolic Panel:  Recent Labs Lab 03/02/14 1810 03/03/14 0830 03/04/14 1455  NA 133* 136* 134*  K 4.6 4.1 3.9  CL 95* 98 98  CO2 22 25 21   GLUCOSE 89 96 124*  BUN 22 18 17   CREATININE 1.10 1.02 0.97  CALCIUM 9.0 8.7 8.1*   Liver Function Tests:  Recent Labs Lab 03/02/14 1810 03/03/14 0830  AST 20 15  ALT 8 7  ALKPHOS 72 68  BILITOT 0.5 0.5  PROT 7.2 6.8  ALBUMIN 3.1* 3.0*   No results found for this basename: LIPASE, AMYLASE,  in the last 168 hours No results found for this basename: AMMONIA,  in the last 168 hours CBC:  Recent Labs Lab 03/02/14 1810 03/03/14 0830  WBC 9.6 8.8  NEUTROABS 6.8 6.2  HGB 11.7* 11.0*  HCT 35.4* 33.6*  MCV 89.8 90.1  PLT 348 322   Cardiac Enzymes:  Recent Labs Lab 03/02/14 1810  TROPONINI <0.30   BNP: BNP (last 3 results) No results found for this basename: PROBNP,  in the last 8760 hours CBG: No results found for this basename: GLUCAP,  in the last 168 hours     Signed:  Nadiyah Zeis S  [facility] 03/05/2014, 2:14 PM

## 2014-03-06 LAB — BODY FLUID CULTURE
Culture: NO GROWTH
GRAM STAIN: NONE SEEN

## 2014-03-08 ENCOUNTER — Ambulatory Visit: Payer: Medicare Other | Admitting: Family Medicine

## 2014-03-08 LAB — CULTURE, BLOOD (ROUTINE X 2)
CULTURE: NO GROWTH
Culture: NO GROWTH

## 2014-03-11 ENCOUNTER — Ambulatory Visit (INDEPENDENT_AMBULATORY_CARE_PROVIDER_SITE_OTHER)
Admission: RE | Admit: 2014-03-11 | Discharge: 2014-03-11 | Disposition: A | Discharge status: Home / Self Care | Payer: Medicare Other | Source: Ambulatory Visit | Attending: Internal Medicine | Admitting: Internal Medicine

## 2014-03-11 ENCOUNTER — Encounter: Payer: Self-pay | Admitting: Internal Medicine

## 2014-03-11 ENCOUNTER — Ambulatory Visit (INDEPENDENT_AMBULATORY_CARE_PROVIDER_SITE_OTHER): Payer: Medicare Other | Admitting: Internal Medicine

## 2014-03-11 ENCOUNTER — Other Ambulatory Visit (INDEPENDENT_AMBULATORY_CARE_PROVIDER_SITE_OTHER): Payer: Medicare Other

## 2014-03-11 VITALS — BP 118/64 | HR 86 | Temp 97.8°F

## 2014-03-11 DIAGNOSIS — J90 Pleural effusion, not elsewhere classified: Secondary | ICD-10-CM

## 2014-03-11 DIAGNOSIS — R0989 Other specified symptoms and signs involving the circulatory and respiratory systems: Secondary | ICD-10-CM

## 2014-03-11 DIAGNOSIS — R0609 Other forms of dyspnea: Secondary | ICD-10-CM

## 2014-03-11 DIAGNOSIS — I6350 Cerebral infarction due to unspecified occlusion or stenosis of unspecified cerebral artery: Secondary | ICD-10-CM

## 2014-03-11 DIAGNOSIS — R0600 Dyspnea, unspecified: Secondary | ICD-10-CM

## 2014-03-11 LAB — CBC WITH DIFFERENTIAL/PLATELET
Basophils Absolute: 0 10*3/uL (ref 0.0–0.1)
Basophils Relative: 0.3 % (ref 0.0–3.0)
EOS ABS: 0.2 10*3/uL (ref 0.0–0.7)
Eosinophils Relative: 2.7 % (ref 0.0–5.0)
HCT: 36.3 % — ABNORMAL LOW (ref 39.0–52.0)
Hemoglobin: 11.9 g/dL — ABNORMAL LOW (ref 13.0–17.0)
Lymphocytes Relative: 13.4 % (ref 12.0–46.0)
Lymphs Abs: 1.1 10*3/uL (ref 0.7–4.0)
MCHC: 32.8 g/dL (ref 30.0–36.0)
MCV: 90.7 fl (ref 78.0–100.0)
MONO ABS: 0.7 10*3/uL (ref 0.1–1.0)
Monocytes Relative: 8.4 % (ref 3.0–12.0)
NEUTROS ABS: 6.4 10*3/uL (ref 1.4–7.7)
NEUTROS PCT: 75.2 % (ref 43.0–77.0)
Platelets: 419 10*3/uL — ABNORMAL HIGH (ref 150.0–400.0)
RBC: 4 Mil/uL — AB (ref 4.22–5.81)
RDW: 14.4 % (ref 11.5–15.5)
WBC: 8.5 10*3/uL (ref 4.0–10.5)

## 2014-03-11 LAB — BASIC METABOLIC PANEL
BUN: 12 mg/dL (ref 6–23)
CO2: 28 mEq/L (ref 19–32)
CREATININE: 1.2 mg/dL (ref 0.4–1.5)
Calcium: 8.8 mg/dL (ref 8.4–10.5)
Chloride: 101 mEq/L (ref 96–112)
GFR: 59.7 mL/min — AB (ref >60.00)
Glucose, Bld: 95 mg/dL (ref 70–99)
POTASSIUM: 3.8 meq/L (ref 3.5–5.1)
Sodium: 135 mEq/L (ref 135–145)

## 2014-03-11 LAB — HEPATIC FUNCTION PANEL
ALT: 15 U/L (ref 0–53)
AST: 21 U/L (ref 0–37)
Albumin: 3 g/dL — ABNORMAL LOW (ref 3.5–5.2)
Alkaline Phosphatase: 58 U/L (ref 39–117)
BILIRUBIN TOTAL: 0.5 mg/dL (ref 0.2–1.2)
Bilirubin, Direct: 0.1 mg/dL (ref 0.0–0.3)
Total Protein: 6.9 g/dL (ref 6.0–8.3)

## 2014-03-11 LAB — BRAIN NATRIURETIC PEPTIDE: PRO B NATRI PEPTIDE: 137 pg/mL — AB (ref 0.0–100.0)

## 2014-03-11 LAB — SEDIMENTATION RATE: Sed Rate: 83 mm/hr — ABNORMAL HIGH (ref 0–22)

## 2014-03-11 NOTE — Patient Instructions (Signed)
Please remember to go to the lab and x-ray department downstairs for your tests - we will call you with the results when they are available.      

## 2014-03-11 NOTE — Progress Notes (Signed)
Subjective:    Patient ID: Matthew Hodge, male    DOB: Jan 20, 1925   MRN: [id number]  HPI  69 yowm never smoker    Admit date: 03/02/2014  Discharge date: 03/05/2014   Discharge Diagnoses:  Aspiration pneumonia   Enlarged prostate with urinary retention  Chronic constipation  UTI (lower urinary tract infection)  Alzheimer's disease  CVA (cerebral infarction)  Generalized weakness  Pleural effusion     History of present illness:  80 y.o. male with Past medical history of Alzheimer's dementia, CVA, dysphagia.  Patient was brought in with the complaint of worsening confusion. Patient does have her dementia and has mild cognitive imbalance at his baseline. Since last to 3 days he has been more drowsy. And he has decreased appetite and decreased oral intake. He also had decreased urine output in his chronic Foley catheter.  He had an episode of vomiting with cough. Since last to 3 days he has been having progressively choking episodes.  The patient is coming from home. And at his baseline independent for most of his ADL.  Hospital Course:  Community acquired pneumonia  patient is presenting with complaints of acute change in his mental status with choking episodes and dysphagia and a possible episode of vomiting.  Chest x-rays showing left-sided effusion with new possible pneumonia. Although he does not have leukocytosis of speech or fever. With this presentation is more likely aspiration pneumonia.  Started on IV Unasyn which should cover adequately for aspiration pneumonia.    discharged on Po Levaquin 500 mg po daily x 10 days  Left Pleural effusion  S/p thoracentesis, removed one liter of pleural fluid. Studies are so far negative for exudate. CXR shows improvement in pleural effusion. He is in no acute distress, not requiring oxygen.  Discussed with pulmonary on call, he can follow up with pulmonary clinic on July 23,  T2, T7 sclerotic lesions  ? Malignancy, discussed with  daughter in detail, will not pursue aggressively. Will check pleural fluid cytology to check for malignancy. Patient is DNR. No further work up for malignancy as per daughter.  Alzheimer's dementia.  Patient does not appear to have any agitation at present. Monitor for delirium.  GERD  Continue Protonix.  UTI  Urine culture grew Klebsiella ozeanae, sensitive to Levaquin  rec  home on Po Levaquin x 10 days  Procedures:  Thoracentesis    03/11/2014 post op hosp  ov/[name] re: L pleural effusion  Chief Complaint  Patient presents with  . Pulmonary Consult    Referred per Dr. Dr Darrick Meigs. Pt recently hospitalized for pleural effusion. He c/o worsening cough since hospital d/c-occ prod with clear sputum.  Cough seems worse at night.   No trouble with swallowing, no purulent sputum. No sob in w/c , hosp bed at 30 degrees hob ok   No obvious other patterns in day to day or daytime variabilty or assoc  cp or chest tightness, subjective wheeze overt sinus or hb symptoms. No unusual exp hx or h/o childhood pna/ asthma or knowledge of premature birth.  Sleeping ok without nocturnal  or early am exacerbation  of respiratory  c/o's or need for noct saba. Also denies any obvious fluctuation of symptoms with weather or environmental changes or other aggravating or alleviating factors except as outlined above   Current Medications, Allergies, Complete Past Medical History, Past Surgical History, Family History, and Social History were reviewed in [employer] record.  Review of Systems  Constitutional: Positive for appetite change. Negative for fever, chills, activity change and unexpected weight change.  HENT: Negative for congestion, dental problem, postnasal drip, rhinorrhea, sneezing, sore throat, trouble swallowing and voice change.   Eyes: Negative for visual disturbance.  Respiratory: Positive for cough. Negative for choking and shortness of breath.   Cardiovascular:  Negative for chest pain and leg swelling.  Gastrointestinal: Negative for nausea, vomiting and abdominal pain.  Genitourinary: Negative for difficulty urinating.  Musculoskeletal: Negative for arthralgias.  Skin: Negative for rash.  Psychiatric/Behavioral: Negative for behavioral problems and confusion.       Objective:   Physical Exam   W/c bound wm knows he's seeing a doctor but not who/ why  Does not know primary 's name  Wt Readings from Last 3 Encounters:  03/05/14 183 lb (83.008 kg)  09/23/13 179 lb 6.4 oz (81.375 kg)  08/29/13 181 lb 10.5 oz (82.4 kg)      HEENT: Edentulous, dentures in place, turbinates, and orophanx. Nl external ear canals without cough reflex   NECK :  without JVD/Nodes/TM/ nl carotid upstrokes bilaterally   LUNGS: no acc muscle use, decreased bs with dullness half way on L    CV:  RRR  no s3 or murmur or increase in P2, no edema   ABD:  soft and nontender with nl excursion in the supine position. No bruits or organomegaly, bowel sounds nl  MS:  warm without deformities, calf tenderness, cyanosis or clubbing  SKIN: warm and dry without lesions    NEURO:  Alert oriented only to person/ not ambulatory as unsteady on feet per fm    CXR  03/11/2014 : Moderate left pleural effusion which has likely increased slightly  in size since thoracentesis on 07/15.     Lab Results  Component Value Date   ESRSEDRATE 83* 03/11/2014      Lab Results  Component Value Date   ALT 15 03/11/2014   AST 21 03/11/2014   ALKPHOS 58 03/11/2014   BILITOT 0.5 03/11/2014      Recent Labs Lab 03/11/14 1140  NA 135  K 3.8  CL 101  CO2 28  BUN 12  CREATININE 1.2  GLUCOSE 95    Recent Labs Lab 03/11/14 1140  HGB 11.9*  HCT 36.3*  WBC 8.5  PLT 419.0*        Assessment & Plan:

## 2014-03-11 NOTE — Assessment & Plan Note (Addendum)
L thoracentesis 03/03/14 x one liter > gluc 85, pro 4.1, LDH 10 amylase 17, wbc 571 and 78% L, cytology neg  - ESR 03/11/2014 = 83 vs bnp 137    The cxr suggests partial loculation/ pleural thickening typical of a complicated parapneumonic process but not empyema with a nl glucose and WBC  L > S c/w chronic process now.  Not really symptomatic at present but eventually will be, not a candidate for vats based on previous comments from fm they wouldn't want anyting more aggressive   A pleurex tube though should he become more symptomatic, would be the next step, but would need to be referred to T surgery for management for this.  Discussed in detail all the  indications, usual  risks and alternatives  relative to the benefits with son who agrees to hold off any further interventions for now and see  Jenna Luo to regroup re overall care.  No pulmonary f/u needed as nothing to offer in this setting

## 2014-03-12 NOTE — Assessment & Plan Note (Signed)
No evidence chf > dyspnea explained by severity of L effusion

## 2014-04-13 ENCOUNTER — Telehealth: Payer: Self-pay | Admitting: *Deleted

## 2014-04-13 NOTE — Telephone Encounter (Signed)
Received fax requesting PA for Aricept.   PA submitted and approved x36 months.   Pharmacy made aware.

## 2014-04-27 ENCOUNTER — Telehealth: Payer: Self-pay | Admitting: Family Medicine

## 2014-04-27 NOTE — Telephone Encounter (Signed)
tammy from [facility] calling to speak to you regarding this patient and how much fluid he is putting out per day, and decreased sounds in left lower lobe and a cough  [phone]

## 2014-04-27 NOTE — Telephone Encounter (Signed)
Call placed to [facility] [place].   MD please advise.

## 2014-04-27 NOTE — Telephone Encounter (Signed)
Call returned from [place], [facility].   Reports that patient was in ER in 02/2014 for pneumonia.   Reports that his LLL continues to have decreased breath sounds and productive cough with grayish colored sputum noted. No fever is noted at this time.   Also states that patient has decreased output for caregiver. Reports that in 8 hour period, patient is only putting out 131mL, while he continues to drink 5-7 cups of fluid.

## 2014-04-27 NOTE — Telephone Encounter (Signed)
MD reviewed and states that patient needs OV.

## 2014-04-28 NOTE — Telephone Encounter (Signed)
Call placed to patient. LMTRC.  

## 2014-04-29 NOTE — Telephone Encounter (Signed)
Call placed to patient.   CNA answered phone and took message for patient son to return call.

## 2014-05-01 NOTE — Telephone Encounter (Signed)
Call placed to patient daughter, Cecille Rubin.   States that patient has appointment scheduled for Monday, 05/03/2014, but patient is immobile at this time.   States that she does not feel that she would be able to get him to office. Advised that she could contact non-emergent EMS for transport, but it is an out of pocket expense.   States that she feels that patient is declining and is going to contact Hospice for evaluation.   MD to be made aware.

## 2014-05-03 ENCOUNTER — Ambulatory Visit: Payer: Medicare Other | Admitting: Family Medicine

## 2014-05-03 NOTE — Telephone Encounter (Signed)
What can we do to help?

## 2014-05-03 NOTE — Telephone Encounter (Signed)
Patient daughter reports that she is going to speak with a few Hospice providers to fine one that will fit their needs.   Advised that order from MD would be required for eval and admit to services if appropriate. States that if order is needed, she will contact office.

## 2014-05-07 ENCOUNTER — Telehealth: Payer: Self-pay | Admitting: Family Medicine

## 2014-05-07 NOTE — Telephone Encounter (Signed)
Hospice sherry  is calling to let you know that she added ibuprofen and tylenol 650 mg for pain  [phone]

## 2014-05-10 ENCOUNTER — Telehealth: Payer: Self-pay | Admitting: Family Medicine

## 2014-05-10 NOTE — Telephone Encounter (Signed)
Requesting some low dose Vicodin for patient.  [facility].

## 2014-05-11 NOTE — Telephone Encounter (Signed)
What do they need vicodin for?

## 2014-05-12 NOTE — Telephone Encounter (Signed)
Just to have on hand incase he gets into distress.  Asking for 20 pills to just have on hand!!

## 2014-05-13 MED ORDER — HYDROCODONE-ACETAMINOPHEN 5-325 MG PO TABS: 1.0000 | ORAL_TABLET | Freq: Four times a day (QID) | ORAL | Status: AC | PRN

## 2014-05-13 NOTE — Telephone Encounter (Signed)
Rx for hospice patient faxed to pharmacy. Unable to reach Hospice nurse.  Left message with family member

## 2014-05-13 NOTE — Telephone Encounter (Signed)
ok 

## 2014-05-13 NOTE — Telephone Encounter (Signed)
Norco 5/325 1-2 poq6 hrs prn pain

## 2014-05-19 ENCOUNTER — Telehealth: Payer: Self-pay | Admitting: *Deleted

## 2014-05-19 NOTE — Telephone Encounter (Signed)
[facility] nurse called stating that pt son wants to know if pt can get back on Generic Protonix and if you thought it would be helpful, states he is having some acid reflux  Please call son Timmothy Sours back at [phone]

## 2014-05-20 NOTE — Telephone Encounter (Signed)
LM to return call to son Timmothy Sours

## 2014-05-20 NOTE — Telephone Encounter (Signed)
i am okay with protonix 40 qday for acid reflux.

## 2014-05-21 NOTE — Telephone Encounter (Signed)
LM on Matthew Hodge (son) to return my call

## 2014-05-24 NOTE — Telephone Encounter (Signed)
LMTRC

## 2014-05-25 NOTE — Telephone Encounter (Signed)
Matthew Hodge called back and is aware of message

## 2014-07-22 ENCOUNTER — Other Ambulatory Visit: Payer: Self-pay | Admitting: Family Medicine

## 2014-07-22 NOTE — Telephone Encounter (Signed)
Refill appropriate and filled per protocol. 

## 2014-10-19 DEATH — deceased

## 2015-02-14 ENCOUNTER — Other Ambulatory Visit: Payer: Self-pay

## 2015-02-22 IMAGING — US US THORACENTESIS ASP PLEURAL SPACE W/IMG GUIDE
1 series · 1 of 1 positions shown · non-contrast
Comparison: None

CLINICAL DATA: Left pleural effusion

EXAM:
ULTRASOUND GUIDED left foot THORACENTESIS

[Series 1: us thoracentesis asp pleural space w/img guide · 0.24mm/px · 1 of 1 slices shown]
[im 1/1]
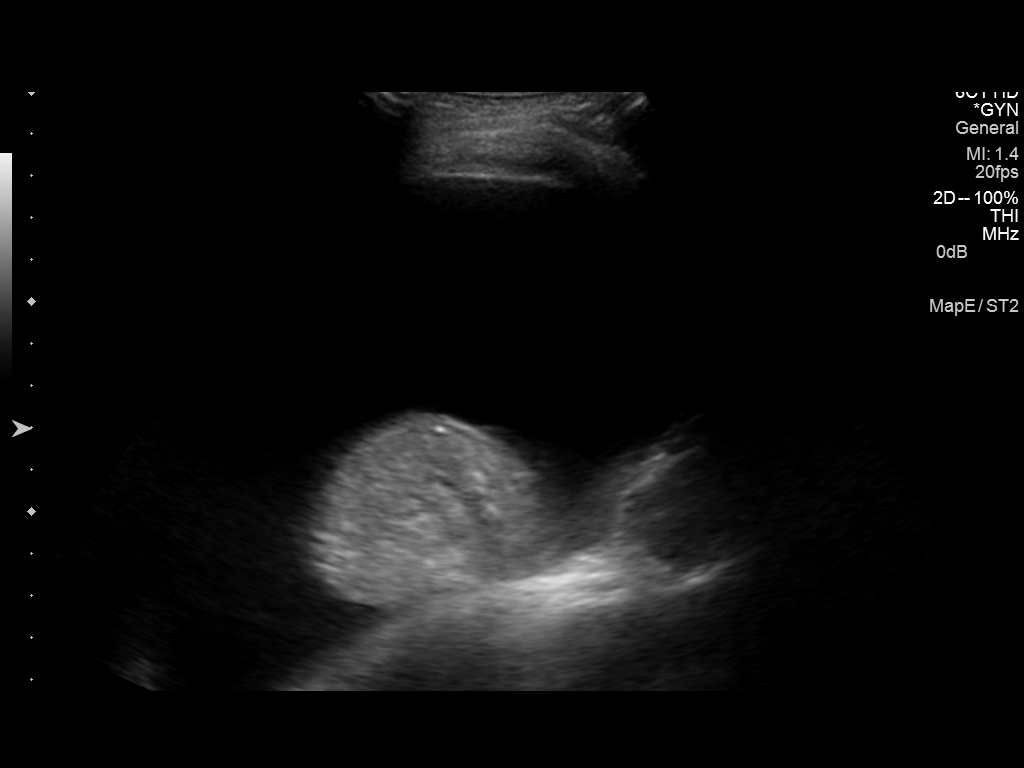

[1 of 1 positions shown; findings below may reference images not displayed]

PROCEDURE:
An ultrasound guided thoracentesis was thoroughly discussed with the
patient and questions answered. The benefits, risks, alternatives
and complications were also discussed. The patient understands and
wishes to proceed with the procedure. Written consent was obtained.

Ultrasound was performed to localize and mark an adequate pocket of
fluid in the left chest. The area was then prepped and draped in the
normal sterile fashion. 1% Lidocaine was used for local anesthesia.
Under ultrasound guidance a 19 gauge Yueh catheter was introduced.
Thoracentesis was performed. The catheter was removed and a dressing
applied.

Complications:  None
FINDINGS: A total of approximately 1 L of yellow fluid was removed. A fluid
sample wassent for laboratory analysis.
IMPRESSION: Successful ultrasound guided left thoracentesis yielding 1 L of
pleural fluid.

## 2015-02-22 IMAGING — CR DG CHEST 1V
1 series · 1 of 1 positions shown · non-contrast
Comparison: Chest radiograph 03/02/2014

CLINICAL DATA: Left thoracentesis

EXAM:
CHEST - 1 VIEW

[x chest ap]
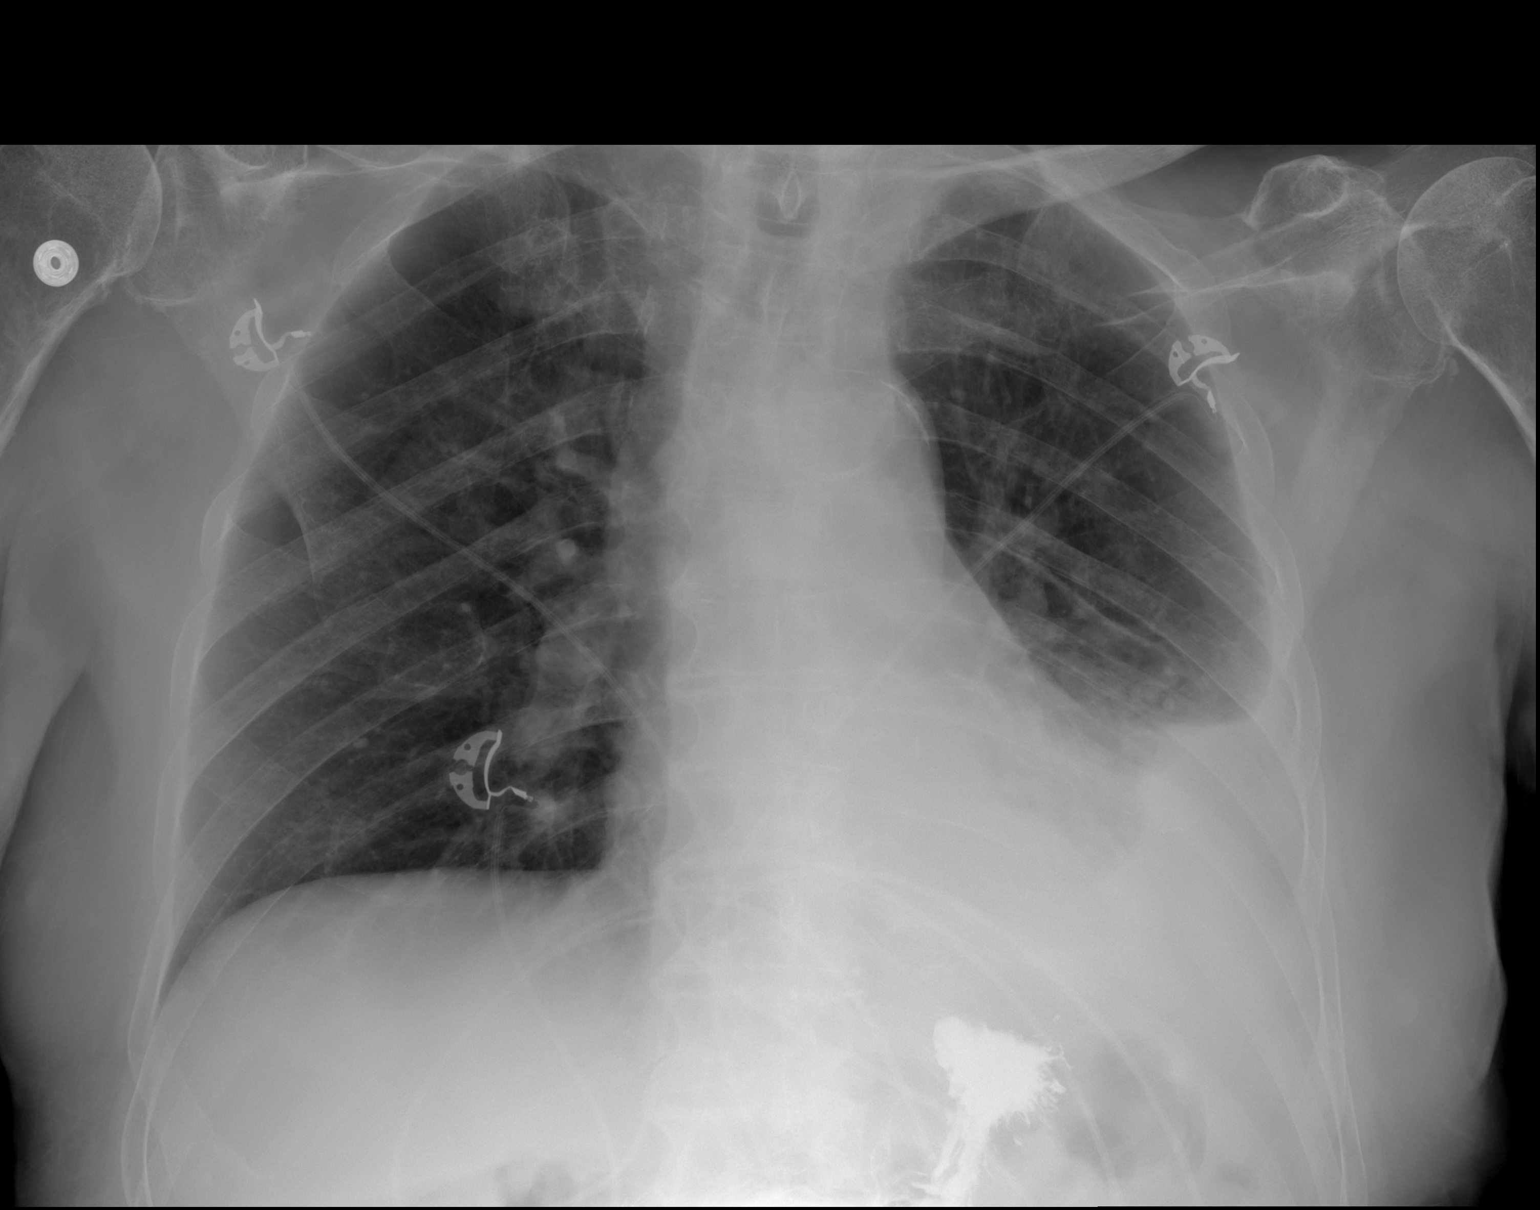

[1 of 1 positions shown; findings below may reference images not displayed]

FINDINGS: Normal cardiac silhouette. There is a moderate left pleural effusion
which is decreased in volume compared to CT of 03/02/2014. Right
lung is clear. Chronic bronchitic markings are noted.
IMPRESSION: Moderate left pleural effusion is mildly decreased from comparison
exam. No pneumothorax.
